# Patient Record
Sex: Female | Born: 1970 | Race: Black or African American | Hispanic: No | Marital: Single | Smoking: Never smoker
Health system: Southern US, Community
[De-identification: ages and names within clinical notes are randomized; demographics above are authoritative.]

## PROBLEM LIST (undated history)

## (undated) DIAGNOSIS — Z973 Presence of spectacles and contact lenses: Secondary | ICD-10-CM

## (undated) DIAGNOSIS — G8929 Other chronic pain: Secondary | ICD-10-CM

## (undated) DIAGNOSIS — D500 Iron deficiency anemia secondary to blood loss (chronic): Secondary | ICD-10-CM

## (undated) DIAGNOSIS — F419 Anxiety disorder, unspecified: Secondary | ICD-10-CM

## (undated) DIAGNOSIS — D219 Benign neoplasm of connective and other soft tissue, unspecified: Secondary | ICD-10-CM

## (undated) DIAGNOSIS — G2581 Restless legs syndrome: Secondary | ICD-10-CM

## (undated) DIAGNOSIS — R1013 Epigastric pain: Secondary | ICD-10-CM

## (undated) DIAGNOSIS — F430 Acute stress reaction: Secondary | ICD-10-CM

## (undated) DIAGNOSIS — F410 Panic disorder [episodic paroxysmal anxiety] without agoraphobia: Secondary | ICD-10-CM

## (undated) DIAGNOSIS — D509 Iron deficiency anemia, unspecified: Secondary | ICD-10-CM

## (undated) DIAGNOSIS — N920 Excessive and frequent menstruation with regular cycle: Secondary | ICD-10-CM

## (undated) DIAGNOSIS — J309 Allergic rhinitis, unspecified: Secondary | ICD-10-CM

## (undated) DIAGNOSIS — R519 Headache, unspecified: Secondary | ICD-10-CM

## (undated) HISTORY — DX: Iron deficiency anemia, unspecified: D50.9

## (undated) HISTORY — DX: Other chronic pain: G89.29

## (undated) HISTORY — DX: Epigastric pain: R10.13

## (undated) HISTORY — DX: Anxiety disorder, unspecified: F41.9

## (undated) HISTORY — DX: Acute stress reaction: F43.0

## (undated) HISTORY — DX: Restless legs syndrome: G25.81

## (undated) HISTORY — PX: TUBAL LIGATION: SHX77

## (undated) HISTORY — DX: Iron deficiency anemia secondary to blood loss (chronic): D50.0

## (undated) HISTORY — DX: Benign neoplasm of connective and other soft tissue, unspecified: D21.9

## (undated) HISTORY — DX: Panic disorder (episodic paroxysmal anxiety): F41.0

## (undated) HISTORY — DX: Allergic rhinitis, unspecified: J30.9

---

## 1996-03-31 HISTORY — PX: TUBAL LIGATION: SHX77

## 1997-11-24 ENCOUNTER — Emergency Department (HOSPITAL_COMMUNITY): Admission: EM | Admit: 1997-11-24 | Discharge: 1997-11-24 | Payer: Self-pay | Admitting: Emergency Medicine

## 1998-07-19 ENCOUNTER — Emergency Department (HOSPITAL_COMMUNITY): Admission: EM | Admit: 1998-07-19 | Discharge: 1998-07-19 | Payer: Self-pay

## 2000-11-11 ENCOUNTER — Emergency Department (HOSPITAL_COMMUNITY): Admission: EM | Admit: 2000-11-11 | Discharge: 2000-11-11 | Payer: Self-pay | Admitting: Emergency Medicine

## 2000-12-08 ENCOUNTER — Other Ambulatory Visit: Admission: RE | Admit: 2000-12-08 | Discharge: 2000-12-08 | Payer: Self-pay | Admitting: Obstetrics and Gynecology

## 2002-06-27 ENCOUNTER — Encounter: Payer: Self-pay | Admitting: Emergency Medicine

## 2002-06-27 ENCOUNTER — Emergency Department (HOSPITAL_COMMUNITY): Admission: EM | Admit: 2002-06-27 | Discharge: 2002-06-27 | Payer: Self-pay | Admitting: Emergency Medicine

## 2002-10-31 ENCOUNTER — Other Ambulatory Visit: Admission: RE | Admit: 2002-10-31 | Discharge: 2002-10-31 | Payer: Self-pay | Admitting: Obstetrics and Gynecology

## 2004-06-13 ENCOUNTER — Other Ambulatory Visit: Admission: RE | Admit: 2004-06-13 | Discharge: 2004-06-13 | Payer: Self-pay | Admitting: Obstetrics and Gynecology

## 2005-07-30 ENCOUNTER — Encounter: Payer: Self-pay | Admitting: Emergency Medicine

## 2005-10-14 ENCOUNTER — Emergency Department (HOSPITAL_COMMUNITY): Admission: EM | Admit: 2005-10-14 | Discharge: 2005-10-15 | Payer: Self-pay | Admitting: Emergency Medicine

## 2005-11-26 ENCOUNTER — Emergency Department (HOSPITAL_COMMUNITY): Admission: EM | Admit: 2005-11-26 | Discharge: 2005-11-27 | Payer: Self-pay | Admitting: Emergency Medicine

## 2006-12-14 ENCOUNTER — Emergency Department (HOSPITAL_COMMUNITY): Admission: EM | Admit: 2006-12-14 | Discharge: 2006-12-15 | Payer: Self-pay | Admitting: Nurse Practitioner

## 2006-12-16 ENCOUNTER — Emergency Department (HOSPITAL_COMMUNITY): Admission: EM | Admit: 2006-12-16 | Discharge: 2006-12-16 | Payer: Self-pay | Admitting: Emergency Medicine

## 2007-01-13 ENCOUNTER — Emergency Department (HOSPITAL_COMMUNITY): Admission: EM | Admit: 2007-01-13 | Discharge: 2007-01-13 | Payer: Self-pay | Admitting: Emergency Medicine

## 2009-09-11 ENCOUNTER — Emergency Department (HOSPITAL_COMMUNITY): Admission: EM | Admit: 2009-09-11 | Discharge: 2009-09-12 | Payer: Self-pay | Admitting: Emergency Medicine

## 2010-01-18 ENCOUNTER — Emergency Department (HOSPITAL_COMMUNITY): Admission: EM | Admit: 2010-01-18 | Discharge: 2010-01-18 | Payer: Self-pay | Admitting: Emergency Medicine

## 2010-03-31 HISTORY — PX: CHOLECYSTECTOMY: SHX55

## 2010-06-12 LAB — CBC
MCV: 82.7 fL (ref 78.0–100.0)
Platelets: 268 10*3/uL (ref 150–400)
RBC: 4.51 MIL/uL (ref 3.87–5.11)
WBC: 15.9 10*3/uL — ABNORMAL HIGH (ref 4.0–10.5)

## 2010-06-12 LAB — URINE MICROSCOPIC-ADD ON

## 2010-06-12 LAB — COMPREHENSIVE METABOLIC PANEL
ALT: 17 U/L (ref 0–35)
AST: 20 U/L (ref 0–37)
Albumin: 3.7 g/dL (ref 3.5–5.2)
Alkaline Phosphatase: 54 U/L (ref 39–117)
Chloride: 108 mEq/L (ref 96–112)
GFR calc Af Amer: >60 mL/min (ref >60)
Potassium: 4.1 mEq/L (ref 3.5–5.1)
Sodium: 136 mEq/L (ref 135–145)
Total Bilirubin: 0.4 mg/dL (ref 0.3–1.2)

## 2010-06-12 LAB — DIFFERENTIAL
Basophils Absolute: 0 10*3/uL (ref 0.0–0.1)
Basophils Relative: 0 % (ref 0–1)
Eosinophils Relative: 0 % (ref 0–5)
Monocytes Absolute: 0.1 10*3/uL (ref 0.1–1.0)

## 2010-06-12 LAB — URINALYSIS, ROUTINE W REFLEX MICROSCOPIC
Bilirubin Urine: NEGATIVE
Ketones, ur: 15 mg/dL — AB
Nitrite: NEGATIVE
pH: 7.5 (ref 5.0–8.0)

## 2010-06-14 ENCOUNTER — Emergency Department (HOSPITAL_COMMUNITY)
Admission: EM | Admit: 2010-06-14 | Discharge: 2010-06-14 | Disposition: A | Discharge status: Home / Self Care | Payer: Medicaid Other | Attending: Emergency Medicine | Admitting: Emergency Medicine

## 2010-06-14 DIAGNOSIS — R112 Nausea with vomiting, unspecified: Secondary | ICD-10-CM | POA: Insufficient documentation

## 2010-06-14 DIAGNOSIS — R197 Diarrhea, unspecified: Secondary | ICD-10-CM | POA: Insufficient documentation

## 2010-06-14 DIAGNOSIS — R109 Unspecified abdominal pain: Secondary | ICD-10-CM | POA: Insufficient documentation

## 2010-06-17 LAB — POCT I-STAT, CHEM 8
BUN: 8 mg/dL (ref 6–23)
Creatinine, Ser: 1 mg/dL (ref 0.4–1.2)
Glucose, Bld: 130 mg/dL — ABNORMAL HIGH (ref 70–99)
Hemoglobin: 12.6 g/dL (ref 12.0–15.0)
TCO2: 21 mmol/L (ref 0–100)

## 2010-07-29 ENCOUNTER — Emergency Department (HOSPITAL_COMMUNITY): Payer: Medicaid Other

## 2010-07-29 ENCOUNTER — Emergency Department (HOSPITAL_COMMUNITY)
Admission: EM | Admit: 2010-07-29 | Discharge: 2010-07-29 | Disposition: A | Discharge status: Home / Self Care | Payer: Medicaid Other | Attending: Emergency Medicine | Admitting: Emergency Medicine

## 2010-07-29 DIAGNOSIS — R10819 Abdominal tenderness, unspecified site: Secondary | ICD-10-CM | POA: Insufficient documentation

## 2010-07-29 DIAGNOSIS — R109 Unspecified abdominal pain: Secondary | ICD-10-CM | POA: Insufficient documentation

## 2010-07-29 DIAGNOSIS — R112 Nausea with vomiting, unspecified: Secondary | ICD-10-CM | POA: Insufficient documentation

## 2010-07-29 LAB — DIFFERENTIAL
Eosinophils Absolute: 0 10*3/uL (ref 0.0–0.7)
Lymphocytes Relative: 5 % — ABNORMAL LOW (ref 12–46)
Lymphs Abs: 0.6 10*3/uL — ABNORMAL LOW (ref 0.7–4.0)
Monocytes Relative: 2 % — ABNORMAL LOW (ref 3–12)
Neutrophils Relative %: 94 % — ABNORMAL HIGH (ref 43–77)

## 2010-07-29 LAB — COMPREHENSIVE METABOLIC PANEL
ALT: 16 U/L (ref 0–35)
AST: 25 U/L (ref 0–37)
Albumin: 3.8 g/dL (ref 3.5–5.2)
Calcium: 9.3 mg/dL (ref 8.4–10.5)
Creatinine, Ser: 0.92 mg/dL (ref 0.4–1.2)
GFR calc Af Amer: >60 mL/min (ref >60)
GFR calc non Af Amer: >60 mL/min (ref >60)
Sodium: 138 mEq/L (ref 135–145)
Total Protein: 7.9 g/dL (ref 6.0–8.3)

## 2010-07-29 LAB — URINE MICROSCOPIC-ADD ON

## 2010-07-29 LAB — CBC
HCT: 37.4 % (ref 36.0–46.0)
MCH: 26.5 pg (ref 26.0–34.0)
MCV: 81.8 fL (ref 78.0–100.0)
Platelets: 288 10*3/uL (ref 150–400)
RBC: 4.57 MIL/uL (ref 3.87–5.11)

## 2010-07-29 LAB — URINALYSIS, ROUTINE W REFLEX MICROSCOPIC
Bilirubin Urine: NEGATIVE
Glucose, UA: NEGATIVE mg/dL
Hgb urine dipstick: NEGATIVE
Specific Gravity, Urine: 1.026 (ref 1.005–1.030)
Urobilinogen, UA: 0.2 mg/dL (ref 0.0–1.0)
pH: 8 (ref 5.0–8.0)

## 2010-07-29 MED ORDER — IOHEXOL 300 MG/ML  SOLN
100.0000 mL | Freq: Once | INTRAMUSCULAR | Status: AC | PRN
  Administered 2010-07-29: 100 mL via INTRAVENOUS

## 2010-07-30 LAB — URINE CULTURE
Colony Count: 30000
Culture  Setup Time: 201204301642

## 2010-08-13 NOTE — Consult Note (Signed)
NAME:  MAHASIN, RIVIERE.:  [id number]  MEDICAL RECORD NO.:  [id number]           PATIENT TYPE:  E  LOCATION:  WLED                         FACILITY:  [facility]  PHYSICIAN:  Ardeth Sportsman, MD     DATE OF BIRTH:  06-18-1970  DATE OF CONSULTATION:  07/29/2010 DATE OF DISCHARGE:                                CONSULTATION   REFERRING PHYSICIAN:  Orlene Och, MD, ER physician.  PRIMARY CARE:  None.  REASON FOR CONSULTATION:  Abdominal pain.  BRIEF HISTORY:  The patient is a 40 year old African-American female who was well until around 12:30 a.m. this morning when she developed pain, followed by nausea and vomiting.  She also had some diarrhea.  Pain persisted and she eventually came to the ER.  She has had some subsequent bowel movements since then, which were normal.  Her bowel movements were normal prior to this morning.  She reports the pain is epigastric in nature, and she had some vague right upper quadrant discomfort on admission.  She continued to have that pain and was treated with medications, currently she is pain-free.  During workup and exam, she did indicate some mild tenderness in the right lower quadrant. She has had no further diarrhea, nausea, or vomiting.  She has no prior history of colitis, Crohn's disease, or other GI problems except for the 2 episodes listed above.  Workup on January 18, 2010 showed a normal gallbladder and negative abdominal ultrasound.  Abdominal ultrasound today again showed no gallstones, gallbladder wall thickening, or pericholecystic fluid, the common bile duct was 4.2 mm.  Urinalysis showed 3 to 6 white cells, unchanged from the last workup.  Lipase 35. CMP shows normal electrolytes, creatinine 0.92, BUN is 6.  LFTs are normal.  Total bilirubin of 0.6, alk phos of 50, SGOT 25, SGPT is 16. CBC shows a white count of 13.7, hemoglobin of 12, hematocrit of 37, has a left shift with 94% neutrophils.  PAST MEDICAL  HISTORY: 1. Panic attacks and stress, this was first found 3 years ago. 2. She is 65 inches, 220 pounds  3. No other medical history.  PAST SURGICAL HISTORY:  Includes tubal ligation.  FAMILY HISTORY:  Father is living at 100 with high blood pressure, otherwise normal.  Mother is living at 24 in good health.  She has three brothers, one had couple MIs, another brother has polyps but no cancer, and one in good health.  No sisters.  SOCIAL HISTORY:  Tobacco none.  Alcohol social.  DRUGS:  None.  She is currently unemployed.  She has 2 children, age is 13 and 23.  She is not married.  REVIEW OF SYSTEMS:  FEVER:  None.  SKIN:  No changes.  PSYCH:  Mild increase in her stress.  WEIGHT:  She says she is up 20 pounds over the last 2 months.  She attributes to stress, CVs is positive for headaches, 2 to 3 times per week.  No stroke or seizures.  PULMONARY:  No orthopnea.  No PND.  No dyspnea on exertion.  No coughing or wheezing or recent URI.  CARDIAC:  No chest  pain.  GI:  Positive for nausea and vomiting, diarrhea this morning.  Currently, she has known of the above. No constipation.  No blood in her stool.  ABDOMEN:  Has pain mostly in the epigastric area.  GU is negative.  GYN:  She is irregular but started her last period 6 days ago.  LOWER EXTREMITIES:  Some occasional swelling.  No claudication.  MUSCULOSKELETAL:  She had some tendinitis 2 months ago, otherwise negative.  CURRENT MEDICATION:  None.  ALLERGIES:  None.  PHYSICAL EXAMINATION:  VITAL SIGNS:  Temperature on admission at 10:30 this morning was 98, blood pressure was 119/77, respiratory rate was 20, sats are 100% on room air.  Last temperature is 98.2 GENERAL:  The patient is an obese African-American female, in no acute distress.  She is currently pain-free. Last temperature is 90.2, heart rate is 109, blood pressure is 123/74, sats of 99%. HEENT:  Head normocephalic.  Ears, nose, throat, and mouth are  grossly within normal limits. NECK:  Trachea is in the midline.  Thyroid is nonpalpable. CHEST:  Clear to auscultation and percussion. RESPIRATORY:  Effort is normal. CARDIAC:  Normal S1-S2.  She is slightly tachy, pulses are +2 and equal bilaterally. ABDOMEN:  Normal bowel sounds.  She is slightly tender in the epigastric area notes her primary complaint, she did have some vague tenderness right upper and lower quadrants which was rather transient.  She can cough, moved, she got and walked to the bathroom without any discomfort. Hernia, none.  Mass is none.  Abscesses none. GI/RECTAL:  Deferred. GENITALIA:  Not examined. LYMPHADENOPATHY:  None palpated. MUSCULOSKELETAL:  Within normal limits. SKIN:  No changes. NEUROLOGIC:  Normal. CRANIAL NERVES:  Gates normal.  No focal deficits. PSYCH:  Normal affect.  IMPRESSION: 1. Abdominal pain with nausea and diarrhea which is now resolved. 2. Elevated white count at three.  CT scan showed lung bases are     clear.  Liver, spleen, pancreas, adrenals are normal.  There was no     renal pathology, no calcified gallstones, and biliary dilatation.     No masses or fluid collection.  The proximal appendix was normal;     however the tip appear dilated.  There was a suggestion of     hyperemia.   ASSESMENT:     This was reviewed by Dr. Michaell Cowing.  It is his opinion that     although this could be  her appendix, it would be very atypical.      It was not enough to warrent surgical admission at this point.  If the     patient's admitted, will follow was see again as needed for     followup.     Eber Hong, P.A.   ______________________________ Ardeth Sportsman, MD    WDJ/MEDQ  D:  07/29/2010  T:  07/29/2010  Job:  [id number]  Electronically Signed by Sherrie George P.A. on 08/03/2010 08:49:31 PM Electronically Signed by Karie Soda MD on 08/13/2010 12:05:55 PM

## 2010-08-17 NOTE — Consult Note (Signed)
NAME:  Victoria Bender.:  [id number]  MEDICAL RECORD NO.:  [id number]           PATIENT TYPE:  E  LOCATION:  WLED                         FACILITY:  [facility]  PHYSICIAN:  Lonia Blood, M.D.DATE OF BIRTH:  1971/03/01  DATE OF CONSULTATION:  07/29/2010 DATE OF DISCHARGE:  07/29/2010                                CONSULTATION   PRIMARY CARE PHYSICIAN:  Unassigned.  CHIEF COMPLAINT:  Abdominal pain.  PRESENT ILLNESS:  Victoria Bender is a very pleasant 40 year old female with no significant past medical history.  She presented to the emergency room with complaints of abdominal pain.  She states she was in her usual state of health until around 12:30 this morning.  She then suffered the acute onset of right upper quadrant and epigastric crampy abdominal pain.  This was shortly thereafter associated with severe nausea and vomiting.  There was no hematemesis.  She also developed some diarrhea along with this.  She states that this might have been related to eating meat loaf prior to the onset of her symptoms.  Her nausea and vomiting continued throughout the morning.  She presented to the emergency room around 9:30 for evaluation.  With antiemetics and pain control, her nausea and vomiting have improved.  It appeared her abdominal pain is coming and going depending upon the pain medications being administered.  She has had no chest pain or shortness of breath. She reports 2 prior episodes similar to this.  The first dates back to October 2011 and the second was approximately 2 months ago.  She states these resolved spontaneously on their own.  In the emergency room, a gallbladder ultrasound has been accomplished and is without evidence of cholecystitis.  A CT scan of the abdomen and pelvis is essentially unremarkable, but there is some noncommittal question raised of a possible distal appendicitis.  The patient has however been seen by the general  surgery service and they do not feel the patient is suffering with acute appendicitis.  The emergency room staff felt the patient was cleared for discharge, stable for discharge home as that apparently to general surgery team.  The patient however was very reluctant to be discharged home.  As a result, consultation was requested with a medical service.  At the time of my evaluation, the patient is resting comfortably in hospital bed.  She has her husband and her father in the room with her. She reports the pain is still there, but states that it has improved some.  As noted above, she has had no further vomiting.  PAST MEDICAL HISTORY:  No chronic medical problems appreciable.  OUTPATIENT MEDICATIONS:  None.  ALLERGIES:  No known drug allergies.  REVIEW OF SYSTEMS:  As comprehensive review of systems is unrevealing with exception to the positive elements noted in the history of present illness above.  FAMILY HISTORY:  Noncontributory, but reviewed.  SOCIAL HISTORY:  The patient lives in the [place] area.  She is married.  She does not smoke.  She does not drink.  LABORATORY DATA:  Urinalysis reveals 3 to 6 white blood cells with a few bacteria and trace leukocyte  esterase, lipase is normal.  CMET is normal with exception to borderline elevated glucose at 113, but total bili is 0.6 with an alk phos of 50 and normal LFTs.  Albumin is 3.8.  Renal function is normal.  White count is mildly elevated at 13.7 with a normal hemoglobin, normal platelet count and a normal MCV.  PHYSICAL EXAMINATION:  VITAL SIGNS:  Temperature is 98.1 with a blood pressure of 123/74, heart rate of 109, respiratory rate of 20, and O2 sats 100% on room air. GENERAL:  Well-developed, well-nourished overweight female in no acute respiratory distress. LUNGS:  Clear to auscultation bilaterally. CARDIOVASCULAR:  Mildly tachycardic at approximately 100 beats per minute, regular rate, regular rhythm otherwise  without murmur, gallop or rub. ABDOMEN:  Mildly tender in the right upper quadrant epigastrium and left upper quadrant, but there is no rebound, no mass.  No ascites and the abdomen is not distended - in fact the patient tolerates palpation over abdomen quite well. EXTREMITIES:  No significant cyanosis, clubbing, edema bilateral lower extremities.  RECOMMENDATIONS:  I have had a very frank discussion with Victoria Bender.  I have informed her that I am aware that she continues to suffer with some pain and that she is quite reluctant to go home, but at this time I do not find any concrete indications for admission.  I have explained to her that I could easily admit her to the hospital, but that I could not necessarily attest to the absolute medical necessity of such an admission in the case that her insurance company could potentially refused to pay for her admission.  I have advised her that in such a situation she would be then held responsible financially for the bill. I have explained to her in no uncertain terms whatsoever that my primary concern is that she be safe and receive appropriate medical care.  I have explained to her that I do feel that she is safe for discharge home.  I have offered her admission to the hospital under observation status if it will help her feel more comfortable, but I again I have reiterated that I do not necessarily feel this is medically necessary and have warned her that it could result in a bill being generated that she herself would be responsible for.  After our discussion, the patient states that she feels okay to go home.  I have advised her that we cannot definitively diagnose her issue but that we do not feel that she has acute appendicitis or acute cholecystitis.  I have also advised that she does not appear to have pancreatitis and that we do think it is safe for discharge home.  I have advised her however that should her  symptoms worsen or she feel that she is not improving that she is always welcome in any time to return immediately back to the emergency room.  I have discussed this case with the emergency room physician and the physician tender caring for the patient.  I have advised that perhaps a 3-day course of antibiotic therapy for her possible urinary tract infection would be reasonable, but otherwise I do feel that she is safe for discharge home.  I have advised them that she is now in agreement with this plan.  It has been a pleasure to meet Victoria Bender and her very nice family.  I will be happy to care for her again in the future should she require return to the emergency room.    Tinnie Gens  Silvestre Gunner, M.D.    JTM/MEDQ  D:  07/29/2010  T:  07/29/2010  Job:  [id number]  Electronically Signed by Jetty Duhamel M.D. on 08/17/2010 03:11:05 PM

## 2010-08-31 ENCOUNTER — Emergency Department (HOSPITAL_COMMUNITY)
Admission: EM | Admit: 2010-08-31 | Discharge: 2010-08-31 | Disposition: A | Discharge status: Home / Self Care | Payer: Medicaid Other | Attending: Emergency Medicine | Admitting: Emergency Medicine

## 2010-08-31 DIAGNOSIS — R10819 Abdominal tenderness, unspecified site: Secondary | ICD-10-CM | POA: Insufficient documentation

## 2010-08-31 DIAGNOSIS — R112 Nausea with vomiting, unspecified: Secondary | ICD-10-CM | POA: Insufficient documentation

## 2010-08-31 DIAGNOSIS — R1013 Epigastric pain: Secondary | ICD-10-CM | POA: Insufficient documentation

## 2010-08-31 LAB — URINALYSIS, ROUTINE W REFLEX MICROSCOPIC
Nitrite: NEGATIVE
Protein, ur: NEGATIVE mg/dL
Urobilinogen, UA: 1 mg/dL (ref 0.0–1.0)

## 2010-08-31 LAB — CBC
MCH: 26.6 pg (ref 26.0–34.0)
MCV: 80.8 fL (ref 78.0–100.0)
Platelets: 314 10*3/uL (ref 150–400)
RBC: 4.43 MIL/uL (ref 3.87–5.11)
RDW: 13.5 % (ref 11.5–15.5)
WBC: 13.1 10*3/uL — ABNORMAL HIGH (ref 4.0–10.5)

## 2010-08-31 LAB — POCT PREGNANCY, URINE: Preg Test, Ur: NEGATIVE

## 2010-08-31 LAB — DIFFERENTIAL
Basophils Relative: 0 % (ref 0–1)
Eosinophils Absolute: 0 10*3/uL (ref 0.0–0.7)
Eosinophils Relative: 0 % (ref 0–5)
Lymphs Abs: 1.8 10*3/uL (ref 0.7–4.0)
Neutrophils Relative %: 82 % — ABNORMAL HIGH (ref 43–77)

## 2010-08-31 LAB — COMPREHENSIVE METABOLIC PANEL
AST: 20 U/L (ref 0–37)
Albumin: 3.7 g/dL (ref 3.5–5.2)
BUN: 6 mg/dL (ref 6–23)
Creatinine, Ser: 0.79 mg/dL (ref 0.4–1.2)
GFR calc Af Amer: >60 mL/min (ref >60)
Total Protein: 7.7 g/dL (ref 6.0–8.3)

## 2010-08-31 LAB — LIPASE, BLOOD: Lipase: 14 U/L (ref 11–59)

## 2010-08-31 LAB — URINE MICROSCOPIC-ADD ON

## 2010-09-01 LAB — URINE CULTURE
Colony Count: 15000
Culture  Setup Time: 201206021916

## 2010-10-14 ENCOUNTER — Ambulatory Visit (INDEPENDENT_AMBULATORY_CARE_PROVIDER_SITE_OTHER): Payer: Medicaid Other | Admitting: Internal Medicine

## 2010-10-14 ENCOUNTER — Encounter: Payer: Self-pay | Admitting: Internal Medicine

## 2010-10-14 VITALS — BP 106/74 | HR 126 | Ht 65.0 in | Wt 231.0 lb

## 2010-10-14 DIAGNOSIS — R112 Nausea with vomiting, unspecified: Secondary | ICD-10-CM | POA: Insufficient documentation

## 2010-10-14 DIAGNOSIS — R1013 Epigastric pain: Secondary | ICD-10-CM

## 2010-10-14 NOTE — Assessment & Plan Note (Addendum)
5 episodes of acute epigastric pain with 3 ED visits since October 2011. Sudden onset. She is well in between. Despite negative abdominal ultrasounds and an unrevealing CT scan, I am suspicious of gallstones or perhaps even a common bile duct stone. She could have gallbladder dyskinesia as well. Will evaluate with HIDA scan and ejection fraction. LFTs, pancreatic function tests have been negative. She has had mild leukocytosis. The ejection fraction is low I think a surgical referral make sense. At this point, though she can't need an upper GI endoscopy I would not do that at this time.  Further plans pending these results and clinical course. Endoscopic ultrasound or MRI and MRCP could be utilized as the next imaging.

## 2010-10-14 NOTE — Progress Notes (Signed)
  Subjective:    Patient ID: Victoria Bender, female    DOB: Jun 12, 1970, 40 y.o.   MRN: [id number]  [facility] yo African-american woman with 8-9 month history of sudden onset of epigastric pain associated with nausea and vomiting. 5 times since October 2011. Has been to ED three times and has had unrevealing work-ups including CT and [place]. One ? Of appendicitis but [facility] thought not. Last episode was a couple of weeks ago and last ED visit 6/2. She is sick for 12-24 hours.  No radiation into back and no clear warning signs or symptoms. Not especially related to eating though did occur 1 hour after ice cream at one point. If gets analgesics in ED will not last 12-24 hours. No associated headaches. Pain as bad as labor pains.     Review of Systems + eyeglasses, tendinitis in thumbs - cortisone shots helped - No NSAID's All other ROS negative    Objective:   Physical Exam  Constitutional: She appears well-developed and well-nourished. No distress.       obese  HENT:  Head: Normocephalic and atraumatic.  Mouth/Throat: No oropharyngeal exudate.  Eyes: Conjunctivae are normal. Pupils are equal, round, and reactive to light. No scleral icterus.  Neck: Normal range of motion. Neck supple. No thyromegaly present.       No cervical or Sterling adenopathy  Cardiovascular: Normal rate, regular rhythm and normal heart sounds.  Exam reveals no gallop and no friction rub.   No murmur heard. Pulmonary/Chest: Effort normal and breath sounds normal.  Abdominal: Soft. Bowel sounds are normal. She exhibits no distension and no mass. There is no tenderness.       No HSM  Musculoskeletal: She exhibits no edema.  Neurological: She is alert.  Skin: Skin is warm and dry.  Psychiatric: She has a normal mood and affect. Her behavior is normal.          Assessment & Plan:

## 2010-10-14 NOTE — Assessment & Plan Note (Signed)
Associated with the epigastric pain I am still thinking of a biiary cause Await HIDA

## 2010-10-14 NOTE — Patient Instructions (Addendum)
As we discussed today, even though the testing so far does not show it I think your pain is most likely from the gallbladder or bile duct. That may not be the case but let's see what the HIDA scan with ejection fraction shows and we will call you with those results and plans. A surgical evaluation will be in order if the ejection fraction is low, which would suggest her gallbladder does not squeeze properly. If that test is unrevealing we'll need to decide whether or not you have an endoscopic ultrasound or MRI scan. Iva Boop, MD, [facility]   You have been scheduled for a HIDA Scan at [facility] on August 1st at 10:00 am. Please arrive at 9:45 am at the Radiology Department on the first floor. Nothing to eat or drink after midnight the night before your procedure.

## 2010-10-30 ENCOUNTER — Encounter (HOSPITAL_COMMUNITY)
Admission: RE | Admit: 2010-10-30 | Discharge: 2010-10-30 | Disposition: A | Discharge status: Home / Self Care | Payer: Medicaid Other | Source: Ambulatory Visit | Attending: Internal Medicine | Admitting: Internal Medicine

## 2010-10-30 DIAGNOSIS — R112 Nausea with vomiting, unspecified: Secondary | ICD-10-CM | POA: Insufficient documentation

## 2010-10-30 DIAGNOSIS — R1013 Epigastric pain: Secondary | ICD-10-CM | POA: Insufficient documentation

## 2010-10-30 MED ORDER — SINCALIDE 5 MCG IJ SOLR: 0.0200 ug/kg | Freq: Once | INTRAMUSCULAR | Status: DC

## 2010-10-30 MED ORDER — TECHNETIUM TC 99M MEBROFENIN IV KIT
5.5000 | PACK | Freq: Once | INTRAVENOUS | Status: AC | PRN
  Administered 2010-10-30: 5.5 via INTRAVENOUS

## 2010-11-04 ENCOUNTER — Telehealth: Payer: Self-pay

## 2010-11-04 NOTE — Telephone Encounter (Signed)
Notes Recorded by Iva Boop, MD on 11/04/2010 at 6:23 AM Gallbladder squeezes very well - so cause of problems not entirely clear yet. Still working on it for her. i cannot remember but does not look like she has seen the surgeon at this point - or has she - please check and let me know ------   Message left for pt to call back.

## 2010-11-05 NOTE — Telephone Encounter (Signed)
Left message for pt to call back  °

## 2010-11-08 ENCOUNTER — Telehealth: Payer: Self-pay

## 2010-11-08 NOTE — Telephone Encounter (Signed)
Message copied by Michele Mcalpine on Fri Nov 08, 2010  3:18 PM ------      Message from: Iva Boop      Created: Fri Nov 08, 2010  1:53 PM       She should have an EGD to evaluate this epigastric pain and vomiting since gallbladder work-up is unrevealing.      Please arrange

## 2010-11-08 NOTE — Progress Notes (Signed)
Quick Note:  She should have an EGD to evaluate this epigastric pain and vomiting since gallbladder work-up is unrevealing. Please arrange ______

## 2010-11-08 NOTE — Telephone Encounter (Signed)
Pt scheduled for previsit 11/13/10@8 :30am, EGD scheduled with Dr. Leone Payor 11/25/10@2 :30pm. Pt aware of appt dates and times.

## 2010-11-08 NOTE — Telephone Encounter (Signed)
Left message for pt to call back.  Spoke with pt and she is aware. Pt states that she has not seen a Careers adviser.

## 2010-11-13 ENCOUNTER — Ambulatory Visit (AMBULATORY_SURGERY_CENTER): Payer: Medicaid Other | Admitting: *Deleted

## 2010-11-13 VITALS — Ht 65.0 in | Wt 228.0 lb

## 2010-11-13 DIAGNOSIS — R1013 Epigastric pain: Secondary | ICD-10-CM

## 2010-11-13 DIAGNOSIS — R112 Nausea with vomiting, unspecified: Secondary | ICD-10-CM

## 2010-11-25 ENCOUNTER — Ambulatory Visit (AMBULATORY_SURGERY_CENTER): Payer: Medicaid Other | Admitting: Internal Medicine

## 2010-11-25 ENCOUNTER — Encounter: Payer: Self-pay | Admitting: Internal Medicine

## 2010-11-25 VITALS — BP 111/59 | HR 72 | Temp 97.9°F | Resp 19 | Ht 65.0 in | Wt 230.0 lb

## 2010-11-25 DIAGNOSIS — R1013 Epigastric pain: Secondary | ICD-10-CM

## 2010-11-25 HISTORY — PX: UPPER GASTROINTESTINAL ENDOSCOPY: SHX188

## 2010-11-25 MED ORDER — HYOSCYAMINE SULFATE 0.125 MG SL SUBL: 0.1250 mg | SUBLINGUAL_TABLET | SUBLINGUAL | Status: DC | PRN

## 2010-11-25 MED ORDER — SODIUM CHLORIDE 0.9 % IV SOLN: 500.0000 mL | INTRAVENOUS | Status: DC

## 2010-11-25 NOTE — Patient Instructions (Addendum)
The esophagus, stomach and duodenum looked normal. We will make an appointment with a surgeon to discuss your symptoms to see if they think the gallbladder could be causing them. I have also prescribed hyoscyamine to help your abdominal pain.  Stop the Prilosec as it has not seemed to help.  discharge instructions given per blue and green sheets Stop prilosec Start hyoscyamine as needed for abdominal pain Dr Marvell Fuller office will arrange a surgical consult and call you with this date and time to evaluate the gall bladder.

## 2010-11-26 ENCOUNTER — Telehealth: Payer: Self-pay

## 2010-11-26 NOTE — Telephone Encounter (Signed)

## 2010-11-26 NOTE — Telephone Encounter (Signed)
I attempted to make referral to [facility] for eval for cholecystectomy.  [facility] will only take the referral from her primary care MD listed on her [employer].  Her card lists the [facility] as her primary care.  I have spoken to Tobi Bastos with the [facility].  They will review our records and see if they can make the referral without seeing the patient, if not they will see the patient and then make the referral.  I have faxed all records to [facility].  The patient is also notified of the above and she was asked to be expecting a call from the Health Dept.

## 2010-11-27 ENCOUNTER — Other Ambulatory Visit: Payer: Self-pay | Admitting: Internal Medicine

## 2010-11-27 DIAGNOSIS — R1013 Epigastric pain: Secondary | ICD-10-CM

## 2010-11-28 MED ORDER — HYOSCYAMINE SULFATE 0.125 MG SL SUBL: 0.1250 mg | SUBLINGUAL_TABLET | SUBLINGUAL | Status: AC | PRN

## 2010-11-28 NOTE — Telephone Encounter (Signed)
Pharmacy stated they didn't receive the Rx electronically sent on 11/25/10. Medication phoned in to [facility] [address]

## 2010-12-10 ENCOUNTER — Emergency Department (HOSPITAL_COMMUNITY): Payer: No Typology Code available for payment source

## 2010-12-10 ENCOUNTER — Encounter (HOSPITAL_COMMUNITY): Payer: Self-pay

## 2010-12-10 ENCOUNTER — Encounter (INDEPENDENT_AMBULATORY_CARE_PROVIDER_SITE_OTHER): Payer: Self-pay | Admitting: General Surgery

## 2010-12-10 ENCOUNTER — Emergency Department (HOSPITAL_COMMUNITY)
Admission: EM | Admit: 2010-12-10 | Discharge: 2010-12-10 | Disposition: A | Discharge status: Home / Self Care | Payer: No Typology Code available for payment source | Attending: Emergency Medicine | Admitting: Emergency Medicine

## 2010-12-10 DIAGNOSIS — M542 Cervicalgia: Secondary | ICD-10-CM | POA: Insufficient documentation

## 2010-12-10 DIAGNOSIS — M545 Low back pain, unspecified: Secondary | ICD-10-CM | POA: Insufficient documentation

## 2010-12-10 DIAGNOSIS — S139XXA Sprain of joints and ligaments of unspecified parts of neck, initial encounter: Secondary | ICD-10-CM | POA: Insufficient documentation

## 2010-12-10 DIAGNOSIS — S335XXA Sprain of ligaments of lumbar spine, initial encounter: Secondary | ICD-10-CM | POA: Insufficient documentation

## 2010-12-12 ENCOUNTER — Encounter (INDEPENDENT_AMBULATORY_CARE_PROVIDER_SITE_OTHER): Payer: Self-pay | Admitting: General Surgery

## 2010-12-12 ENCOUNTER — Ambulatory Visit (INDEPENDENT_AMBULATORY_CARE_PROVIDER_SITE_OTHER): Payer: Medicaid Other | Admitting: General Surgery

## 2010-12-12 VITALS — BP 116/80 | HR 80 | Temp 97.3°F | Ht 65.0 in | Wt 225.1 lb

## 2010-12-12 DIAGNOSIS — R1013 Epigastric pain: Secondary | ICD-10-CM

## 2010-12-12 NOTE — Progress Notes (Signed)
Chief Complaint  Patient presents with  . Other    new pt -eval of gallbladder    HPI Victoria Bender is a 40 y.o. female. This patient was referred by Dr. Leone Payor for evaluation of epigastric discomfort. She states that she has had intermittent epigastric pain since October of 2011. She denies any radiation of discomfort and describes this as a "contraction". The longest episode has lasted 24 hours but usually just last through the night. She has had some episodes immediately after eating ice cream she states that these are random episodes and has not noticed any particular food association. She has very been in the emergency room at least 4 times for evaluation of this send had a negative CT scan and ultrasound. She had a HIDA scan performed August 1 which demonstrated an ejection fraction of 90% and did not have symptoms during the [facility] infusion. When she has episodes she has associated nausea and vomiting and diarrhea but denies any blood in the stools or reflux symptoms. She also had a negative EGD performed in August as well. HPI  Past Medical History  Diagnosis Date  . 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 Abdominal pain   . Nausea & vomiting     Past Surgical History  Procedure Date  . Tubal ligation   . Upper gastrointestinal endoscopy     Family History  Problem Relation Age of Onset  . Hypertension Father   . Heart attack Brother   . Colon polyps Brother   . Pancreatic cancer Maternal Aunt   . Cancer Maternal Aunt     pancreatic  . Diabetes Maternal Grandmother     Social History History  Substance Use Topics  . Smoking status: Never Smoker   . Smokeless tobacco: Never Used  . Alcohol Use: No     social    No Known Allergies  No current outpatient prescriptions on file.    Review of Systems Review of Systems  Constitutional: Negative.   HENT: Negative.   Eyes: Negative.   Respiratory: Negative.   Cardiovascular: Negative.   Gastrointestinal:  Positive for nausea, vomiting, abdominal pain and diarrhea.  Genitourinary: Negative.   Musculoskeletal: Negative.   Skin: Negative.   Neurological: Negative.   Hematological: Negative.   Psychiatric/Behavioral: Negative.   All other systems reviewed and are negative.    Blood pressure 116/80, pulse 80, temperature 97.3 F (36.3 C), height 5\' 5"  (1.651 m), weight 225 lb 2 oz (102.116 kg), last menstrual period 11/20/2010.  Physical Exam Physical Exam  Constitutional: She is oriented to person, place, and time. She appears well-developed and well-nourished. No distress.  HENT:  Head: Normocephalic and atraumatic.  Eyes: Conjunctivae and EOM are normal. Pupils are equal, round, and reactive to light. Right eye exhibits no discharge. Left eye exhibits no discharge. No scleral icterus.  Neck: Normal range of motion. Neck supple. No tracheal deviation present.  Cardiovascular: Normal rate, regular rhythm and normal heart sounds.   Pulmonary/Chest: Effort normal and breath sounds normal. No stridor. No respiratory distress. She has no wheezes.  Abdominal: Soft. Bowel sounds are normal. She exhibits no distension and no mass. There is no tenderness. There is no rebound and no guarding.  Musculoskeletal: Normal range of motion. She exhibits no edema and no tenderness.  Neurological: She is alert and oriented to person, place, and time.  Skin: Skin is warm and dry. No rash noted. She is not diaphoretic. No erythema. No pallor.  Psychiatric: She  has a normal mood and affect. Her behavior is normal. Judgment and thought content normal.    Data Reviewed   Assessment    Epigastric abdominal pain. We had a long discussion about the possible causes of this including biliary dyskinesia or hyperkinesia. She does not have a clear cause for her epigastric abdominal pain and has had a fairly extensive workup to this point. CT and ultrasound have been unrevealing as well as EGD. The only on her malady  was a high scan which demonstrated an ejection fraction of 90% possibly consistent with hyperkinesia or spastic gallbladder. Her symptoms may be due to that. I discussed with her that this is an evolving area of interest and that we do not have a clear cause her discomfort however, I did offer cholecystectomy for possible treatment as well as for possible diagnostic procedure. I explained that one of her greatest risk for the procedure would be that her symptoms persist after cholecystectomy and she expressed understanding of this. I also discussed with her the risks of infection, bleeding, pain, scarring, injury to bowel and bile ducts, and diarrhea, and persistent symptoms. She expressed understanding and would like to proceed with anything else to rule this out as a source for her discomfort.    Plan    We will plan for laparoscopic cholecystectomy as soon as available.       Lodema Pilot DAVID 12/12/2010, 10:24 AM

## 2010-12-18 DIAGNOSIS — K8010 Calculus of gallbladder with chronic cholecystitis without obstruction: Secondary | ICD-10-CM

## 2010-12-20 ENCOUNTER — Telehealth (INDEPENDENT_AMBULATORY_CARE_PROVIDER_SITE_OTHER): Payer: Self-pay | Admitting: General Surgery

## 2010-12-20 NOTE — Telephone Encounter (Signed)
Patient's fiance called stating the patient is experiencing headaches after taking vicodin. Had surgery on Wednesday and has had this symptom after taking the medication ever since. Patient doesn't have a history of headaches. She has no other medication reactions that she is aware of. Can we change her prescription? Dr Biagio Quint pm off. Please advise.

## 2010-12-20 NOTE — Telephone Encounter (Signed)
Made patient aware to switch to Ibuprofen or Tylenol because the other pain medicine option would be in the same family as vicodin. Patient agrees with this plan and will switch to tylenol or ibuprofen.

## 2010-12-20 NOTE — Telephone Encounter (Signed)
Patient may switch to Ibuprofen or regular tylenol.  If this is insufficient for her pain control, she will need to come by to pick up a prescription for Percocet, but this is likely to continue to cause headaches if her headaches are indeed caused by Vicodin.

## 2011-01-08 LAB — POCT CARDIAC MARKERS
CKMB, poc: <1 — ABNORMAL LOW
Troponin i, poc: <0.05

## 2011-01-08 LAB — I-STAT 8, (EC8 V) (CONVERTED LAB)
Acid-base deficit: 2
BUN: 8
Bicarbonate: 23.6
Chloride: 107
Glucose, Bld: 95
HCT: 38
Hemoglobin: 12.9
Operator id: 270111
Potassium: 3.9
Sodium: 141
TCO2: 25
pCO2, Ven: 41.2 — ABNORMAL LOW
pH, Ven: 7.366 — ABNORMAL HIGH

## 2011-01-08 LAB — POCT I-STAT CREATININE
Creatinine, Ser: 1
Operator id: 270111

## 2011-01-08 LAB — D-DIMER, QUANTITATIVE: D-Dimer, Quant: 0.48

## 2011-01-09 ENCOUNTER — Encounter (INDEPENDENT_AMBULATORY_CARE_PROVIDER_SITE_OTHER): Payer: Self-pay | Admitting: General Surgery

## 2011-01-09 ENCOUNTER — Ambulatory Visit (INDEPENDENT_AMBULATORY_CARE_PROVIDER_SITE_OTHER): Payer: Medicaid Other | Admitting: General Surgery

## 2011-01-09 VITALS — BP 122/78 | HR 68 | Temp 98.3°F | Resp 16 | Ht 65.0 in | Wt 225.8 lb

## 2011-01-09 DIAGNOSIS — Z5189 Encounter for other specified aftercare: Secondary | ICD-10-CM

## 2011-01-09 DIAGNOSIS — Z4889 Encounter for other specified surgical aftercare: Secondary | ICD-10-CM

## 2011-01-09 LAB — POCT CARDIAC MARKERS
CKMB, poc: <1 — ABNORMAL LOW
CKMB, poc: <1 — ABNORMAL LOW
Myoglobin, poc: 59.6
Myoglobin, poc: 38.6
Operator id: 270111
Troponin i, poc: <0.05

## 2011-01-09 LAB — D-DIMER, QUANTITATIVE: D-Dimer, Quant: 0.7 — ABNORMAL HIGH

## 2011-01-09 NOTE — Progress Notes (Signed)
Subjective:     Patient ID: Victoria Bender, female   DOB: December 30, 1970, 40 y.o.   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 She states that she has been doing well and has not had any recurrent episodes of her discomfort. She has been tolerating regular diet and has had ice cream without any recurrence of her symptoms. She states her bowels are functioning well and she is off pain medications. She states that she has returned to normal activities.  Review of Systems     Objective:   Physical Exam No distress and nontoxic-appearing her incisions are healing well without sign of infection. No evidence of hernia.    Assessment:     Status post cholecystectomy-doing well Pathology was benign    Plan:     She can return to activities as tolerated and followup on a p.r.n. basis. It appears that her symptoms may have been due to her gallbladder since she has had good relief since her surgery.

## 2011-01-16 ENCOUNTER — Inpatient Hospital Stay (HOSPITAL_COMMUNITY)
Admission: EM | Admit: 2011-01-16 | Discharge: 2011-01-18 | DRG: 440 | Disposition: A | Discharge status: Home / Self Care | Payer: Medicaid Other | Attending: Internal Medicine | Admitting: Internal Medicine

## 2011-01-16 DIAGNOSIS — R748 Abnormal levels of other serum enzymes: Secondary | ICD-10-CM | POA: Diagnosis present

## 2011-01-16 DIAGNOSIS — R112 Nausea with vomiting, unspecified: Secondary | ICD-10-CM | POA: Diagnosis present

## 2011-01-16 DIAGNOSIS — K859 Acute pancreatitis without necrosis or infection, unspecified: Principal | ICD-10-CM | POA: Diagnosis present

## 2011-01-16 DIAGNOSIS — Z9089 Acquired absence of other organs: Secondary | ICD-10-CM

## 2011-01-16 DIAGNOSIS — R1013 Epigastric pain: Secondary | ICD-10-CM | POA: Diagnosis present

## 2011-01-16 LAB — COMPREHENSIVE METABOLIC PANEL
AST: 14 U/L (ref 0–37)
Albumin: 3.6 g/dL (ref 3.5–5.2)
BUN: 7 mg/dL (ref 6–23)
CO2: 23 mEq/L (ref 19–32)
Calcium: 9.7 mg/dL (ref 8.4–10.5)
Chloride: 107 mEq/L (ref 96–112)
Creatinine, Ser: 0.89 mg/dL (ref 0.50–1.10)
Glucose, Bld: 114 mg/dL — ABNORMAL HIGH (ref 70–99)
Potassium: 4 mEq/L (ref 3.5–5.1)
Sodium: 139 mEq/L (ref 135–145)

## 2011-01-16 LAB — DIFFERENTIAL
Basophils Absolute: 0 10*3/uL (ref 0.0–0.1)
Basophils Relative: 0 % (ref 0–1)
Lymphocytes Relative: 14 % (ref 12–46)
Monocytes Absolute: 0.6 10*3/uL (ref 0.1–1.0)
Monocytes Relative: 6 % (ref 3–12)
Neutro Abs: 8.9 10*3/uL — ABNORMAL HIGH (ref 1.7–7.7)
Neutrophils Relative %: 80 % — ABNORMAL HIGH (ref 43–77)

## 2011-01-16 LAB — CBC
HCT: 36.1 % (ref 36.0–46.0)
Hemoglobin: 11.5 g/dL — ABNORMAL LOW (ref 12.0–15.0)
MCHC: 31.9 g/dL (ref 30.0–36.0)
RBC: 4.48 MIL/uL (ref 3.87–5.11)

## 2011-01-16 LAB — URINALYSIS, ROUTINE W REFLEX MICROSCOPIC
Nitrite: NEGATIVE
Specific Gravity, Urine: 1.027 (ref 1.005–1.030)
pH: 6 (ref 5.0–8.0)

## 2011-01-16 LAB — URINE MICROSCOPIC-ADD ON

## 2011-01-16 LAB — POCT PREGNANCY, URINE: Preg Test, Ur: NEGATIVE

## 2011-01-17 ENCOUNTER — Inpatient Hospital Stay (HOSPITAL_COMMUNITY): Payer: Medicaid Other

## 2011-01-17 LAB — MAGNESIUM: Magnesium: 1.8 mg/dL (ref 1.5–2.5)

## 2011-01-17 LAB — CBC
Hemoglobin: 10.2 g/dL — ABNORMAL LOW (ref 12.0–15.0)
MCH: 26.3 pg (ref 26.0–34.0)
MCHC: 32.3 g/dL (ref 30.0–36.0)
MCV: 81.4 fL (ref 78.0–100.0)

## 2011-01-17 LAB — DIFFERENTIAL
Basophils Relative: 0 % (ref 0–1)
Eosinophils Absolute: 0.1 10*3/uL (ref 0.0–0.7)
Monocytes Absolute: 0.4 10*3/uL (ref 0.1–1.0)
Monocytes Relative: 5 % (ref 3–12)
Neutro Abs: 5.3 10*3/uL (ref 1.7–7.7)

## 2011-01-17 LAB — COMPREHENSIVE METABOLIC PANEL
ALT: 9 U/L (ref 0–35)
Calcium: 9 mg/dL (ref 8.4–10.5)
Creatinine, Ser: 0.78 mg/dL (ref 0.50–1.10)
GFR calc Af Amer: >90 mL/min (ref >90)
Glucose, Bld: 104 mg/dL — ABNORMAL HIGH (ref 70–99)
Sodium: 137 mEq/L (ref 135–145)
Total Protein: 6.7 g/dL (ref 6.0–8.3)

## 2011-01-17 LAB — PHOSPHORUS: Phosphorus: 3.3 mg/dL (ref 2.3–4.6)

## 2011-01-17 LAB — LIPASE, BLOOD: Lipase: 23 U/L (ref 11–59)

## 2011-01-17 MED ORDER — IOHEXOL 300 MG/ML  SOLN
100.0000 mL | Freq: Once | INTRAMUSCULAR | Status: AC | PRN
  Administered 2011-01-17: 100 mL via INTRAVENOUS

## 2011-01-18 ENCOUNTER — Inpatient Hospital Stay (HOSPITAL_COMMUNITY): Payer: Medicaid Other

## 2011-01-18 LAB — COMPREHENSIVE METABOLIC PANEL
AST: 13 U/L (ref 0–37)
Albumin: 2.9 g/dL — ABNORMAL LOW (ref 3.5–5.2)
BUN: 4 mg/dL — ABNORMAL LOW (ref 6–23)
Calcium: 8.8 mg/dL (ref 8.4–10.5)
Chloride: 108 mEq/L (ref 96–112)
Creatinine, Ser: 0.95 mg/dL (ref 0.50–1.10)
Total Protein: 6.3 g/dL (ref 6.0–8.3)

## 2011-01-18 LAB — AMYLASE: Amylase: 62 U/L (ref 0–105)

## 2011-01-21 NOTE — H&P (Signed)
NAME:  Victoria Bender, Victoria Bender.:  [id number]  MEDICAL RECORD NO.:  [id number]  LOCATION:  [facility]                         FACILITY:  [facility]  PHYSICIAN:  Kathlen Mody, MD       DATE OF BIRTH:  June 17, 1970  DATE OF ADMISSION:  01/16/2011 DATE OF DISCHARGE:                             HISTORY & PHYSICAL   CHIEF COMPLAINT:  Epigastric pain since this afternoon.  HISTORY OF PRESENT ILLNESS:  40 year old lady with no significant past medical history came in for severe epigastric pain, started this afternoon associated with persistent nausea and vomiting.  Vomiting is nonbilious, nonbloody.  She had similar symptoms of nausea, vomiting, epigastric pain  over the last few months and she was extensively worked up with CT abdomen pelvis, HIDA scan, and ultrasound, and she had a prophylactic cholecystectomy done.  Her symptoms have resolved after cholecystectomy, but since this afternoon she had persistent epigastric pain, nonradiating associated with nausea and vomiting.  She denies any fever, chills, shortness of breath, chest pain or syncopal episodes. Denies any urinary complaints, headache, blurry vision.  In ED she was found to have an elevated lipase of 149.  Hospitalist Service have been requested to admit the patient for further evaluation and management for acute pancreatitis.  REVIEW OF SYSTEMS:  See HPI, otherwise negative.  PAST MEDICAL HISTORY:  Chronic abdominal pain.  PAST SURGICAL HISTORY:  Cholecystectomy.  FAMILY HISTORY:  Pancreatic cancer in the aunt.  ALLERGIES:  No known drug allergies.  SOCIAL HISTORY:  The patient lives at home with her husband.  Denies smoking, ETOH, or any recreational drug use.  PHYSICAL EXAMINATION:  VITAL SIGNS:  She is afebrile.  Temperature of 98.9, blood pressure 120/80, respiratory rate 18, pulse of 88, saturating 100% on room air. GENERAL:  On exam, she is alert, afebrile, in mild distress. HEENT:  Pupils equal,  reacting to light.  No JVD.  No scleral icterus. Dry mucous membranes. CARDIOVASCULAR:  S1, S2 heard.  No murmurs, rubs, or gallops. ABDOMEN:  Soft.  Epigastric tenderness.  No rebound tenderness.  No signs of peritonitis.  Bowel sounds are heard. EXTREMITIES:  No pedal edema, cyanosis, or clubbing. LUNGS:  Clear to auscultation bilaterally.  No wheezing or rhonchi.  PERTINENT LABS:  CBC showed a slight elevation of WBC count of 11,000, hemoglobin of 11.5, platelets normal.  Comprehensive metabolic panel is significant for glucose of 114.  LFTs within normal limits.  Lipase of 149.  Urine pregnancy test negative.  Urinalysis shows trace leukocytes with few bacteria.  ASSESSMENT AND PLAN:  40 year old lady with a history of chronic abdominal pain status post cholecystectomy last month, came in for persistent epigastric pain, nausea, vomiting, found to have elevated lipase.  She is being admitted for acute pancreatitis.  We will admit the patient to regular bed, start on IV fluids, normal saline at 125 mL/h, IV pain control with morphine 1 to 2 mg q.4 hours p.r.n., clear liquid diet.  Repeat lipase in a.m.  We will get a CT abdomen and pelvis with contrast.  We will probably need a gastrointestinal consult from [place] for evaluation of common bile duct stones.  We will also start the patient on  IV Protonix 40 mg daily.  DVT prophylaxis,  subcutaneous Lovenox dosing as per the pharmacy.  Further recommendation as per her clinical course.  She is full code.          ______________________________ Kathlen Mody, MD     VA/MEDQ  D:  01/16/2011  T:  01/16/2011  Job:  [id number]  Electronically Signed by Kathlen Mody MD on 01/21/2011 03:03:34 PM

## 2011-01-23 LAB — CULTURE, BLOOD (ROUTINE X 2): Culture  Setup Time: 201210190857

## 2011-02-07 ENCOUNTER — Ambulatory Visit: Payer: Medicaid Other | Admitting: Internal Medicine

## 2011-03-05 ENCOUNTER — Ambulatory Visit (INDEPENDENT_AMBULATORY_CARE_PROVIDER_SITE_OTHER): Payer: Medicaid Other | Admitting: Internal Medicine

## 2011-03-05 ENCOUNTER — Encounter: Payer: Self-pay | Admitting: Internal Medicine

## 2011-03-05 VITALS — BP 100/70 | HR 88 | Ht 65.0 in | Wt 228.2 lb

## 2011-03-05 DIAGNOSIS — R112 Nausea with vomiting, unspecified: Secondary | ICD-10-CM

## 2011-03-05 DIAGNOSIS — R1013 Epigastric pain: Secondary | ICD-10-CM

## 2011-03-05 MED ORDER — PROCHLORPERAZINE 25 MG RE SUPP: 25.0000 mg | Freq: Two times a day (BID) | RECTAL | Status: AC | PRN

## 2011-03-05 MED ORDER — HYDROCODONE-ACETAMINOPHEN 5-500 MG PO TABS: 1.0000 | ORAL_TABLET | ORAL | Status: AC | PRN

## 2011-03-05 MED ORDER — PROMETHAZINE HCL 25 MG PO TABS: 25.0000 mg | ORAL_TABLET | Freq: Four times a day (QID) | ORAL | Status: AC | PRN

## 2011-03-05 NOTE — Patient Instructions (Signed)
Your prescription(s) has(have) been sent to your pharmacy for you to pick up (Hydrocodone, Phenergan, Compazine suppository). Use these medications as directed to help with your nausea and vomiting. Go on a liquid diet if these medications doesn't help and please give [place] a call back if you are not getting any better. Return to see Dr. Leone Payor as needed or sooner.

## 2011-03-05 NOTE — Progress Notes (Signed)
Subjective:    Patient ID: Victoria Bender, female    DOB: 1970-05-03, 40 y.o.   MRN: [id number]  HPI The patient is a pleasant 40 year old African American woman I have seen for recurrent epigastric pain and nausea and vomiting. She was worked up with an EGD, ultrasound and a HIDA scan. Biliary dyskinesia was thought to be the problem. She underwent cholecystectomy. About a month after that she had another episode of acute epigastric pain associated with nausea and vomiting and some diarrhea. It was typical of the spells she has been having for the past 10 months. It was of sudden onset without warning. She went to the hospital and was admitted and was given a diagnosis of pancreatitis. I see in 1 documentation note that the lipase was mildly elevated though in the lab review I cannot find that. She had a CT scan of the abdomen and pelvis, and an MRI MRCP which showed no pancreatic abnormalities or any other significant abnormalities. She feels well since then, as is typical of her episodic pain and problems. No Known Allergies No outpatient prescriptions prior to visit.   Past Medical History  Diagnosis Date  . 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 Chronic epigastric pain     recurrent discrete episodes   Past Surgical History  Procedure Date  . Tubal ligation   . Upper gastrointestinal endoscopy 11/25/2010    NORMAL  . Cholecystectomy 2012   History   Social History  . Marital Status: Divorced    Spouse Name: N/A    Number of Children: 2  . Years of Education: N/A   Social History Main Topics  . Smoking status: Never Smoker   . Smokeless tobacco: Never Used  . Alcohol Use: No     social  . Drug Use: No  . Sexually Active: None   Other Topics Concern  . None   Social History Narrative  . None   Family History  Problem Relation Age of Onset  . Hypertension Father   . Heart attack Brother   . Colon polyps Brother   . Pancreatic cancer Maternal Aunt   . Diabetes  Maternal Grandmother   . Colon cancer Neg Hx        Review of Systems There is no history of headaches, no migraines specifically. She does have increased cramping with her menstrual periods but it is not associated the spells of epigastric pain and vomiting.    Objective:   Physical Exam Obese well-developed well-nourished black woman in no acute distress Abdomen is obese soft and nontender without organomegaly or mass bowel sounds are present, there is no succussion splash  Data reviewed included labs from hospitalization in October CT scanning MR CP results and old records from previous visits here.    Assessment & Plan:  She has a chronic recurrent epigastric pain and nausea and vomiting syndrome for the last 8 months. It is episodic, and usually of short duration not more than a day. Laboratory testing, upper endoscopy, ultrasound, CT scanning and MRI with MRCP had not revealed a cause. Although it is not typical in that she does not have migraine headache history, perhaps this is an abdominal migraine.  I have explained to her that we have ruled out most serious problems. She has had one spell since her cholecystectomy. I'm not convinced that means this is a recurrent problem but it probably is, unfortunately. At this point we have decided to treat her symptomatically and track  over time. I have hydrocodone with acetaminophen, oral promethazine, and rectal Compazine to try to treat future episodes prescribed. She is to call back if she has problems again.  She could need more aggressive workup depending upon future problems, if they occur. Some consideration would be for porphyria, however that is extremely uncommon, and possibly some other type of formal release syndrome such as a pheochromocytoma but imaging has not suggested any abnormalities at this point. Again that is rare and I think her symptoms are atypical for that.

## 2011-04-12 ENCOUNTER — Emergency Department (HOSPITAL_COMMUNITY)
Admission: EM | Admit: 2011-04-12 | Discharge: 2011-04-12 | Disposition: A | Discharge status: Home / Self Care | Payer: Medicaid Other | Attending: Emergency Medicine | Admitting: Emergency Medicine

## 2011-04-12 ENCOUNTER — Encounter (HOSPITAL_COMMUNITY): Payer: Self-pay | Admitting: *Deleted

## 2011-04-12 DIAGNOSIS — R112 Nausea with vomiting, unspecified: Secondary | ICD-10-CM | POA: Insufficient documentation

## 2011-04-12 DIAGNOSIS — R1013 Epigastric pain: Secondary | ICD-10-CM | POA: Insufficient documentation

## 2011-04-12 DIAGNOSIS — R21 Rash and other nonspecific skin eruption: Secondary | ICD-10-CM | POA: Insufficient documentation

## 2011-04-12 DIAGNOSIS — Z9089 Acquired absence of other organs: Secondary | ICD-10-CM | POA: Insufficient documentation

## 2011-04-12 LAB — COMPREHENSIVE METABOLIC PANEL
ALT: 11 U/L (ref 0–35)
AST: 13 U/L (ref 0–37)
Albumin: 3.6 g/dL (ref 3.5–5.2)
Alkaline Phosphatase: 63 U/L (ref 39–117)
BUN: 8 mg/dL (ref 6–23)
CO2: 21 mEq/L (ref 19–32)
Calcium: 9.3 mg/dL (ref 8.4–10.5)
Chloride: 106 mEq/L (ref 96–112)
Creatinine, Ser: 0.89 mg/dL (ref 0.50–1.10)
GFR calc Af Amer: >90 mL/min (ref >90)
GFR calc non Af Amer: 81 mL/min — ABNORMAL LOW (ref >90)
Glucose, Bld: 122 mg/dL — ABNORMAL HIGH (ref 70–99)
Potassium: 4.5 mEq/L (ref 3.5–5.1)
Sodium: 136 mEq/L (ref 135–145)
Total Bilirubin: 0.4 mg/dL (ref 0.3–1.2)
Total Protein: 7.7 g/dL (ref 6.0–8.3)

## 2011-04-12 LAB — POCT PREGNANCY, URINE: Preg Test, Ur: NEGATIVE

## 2011-04-12 LAB — CBC
HCT: 35.1 % — ABNORMAL LOW (ref 36.0–46.0)
Hemoglobin: 11.8 g/dL — ABNORMAL LOW (ref 12.0–15.0)
MCH: 26.2 pg (ref 26.0–34.0)
MCHC: 33.6 g/dL (ref 30.0–36.0)
MCV: 77.8 fL — ABNORMAL LOW (ref 78.0–100.0)
Platelets: 273 10*3/uL (ref 150–400)
RBC: 4.51 MIL/uL (ref 3.87–5.11)
RDW: 14.9 % (ref 11.5–15.5)
WBC: 13.3 10*3/uL — ABNORMAL HIGH (ref 4.0–10.5)

## 2011-04-12 LAB — DIFFERENTIAL
Basophils Absolute: 0 10*3/uL (ref 0.0–0.1)
Basophils Relative: 0 % (ref 0–1)
Eosinophils Absolute: 0 10*3/uL (ref 0.0–0.7)
Eosinophils Relative: 0 % (ref 0–5)
Lymphocytes Relative: 7 % — ABNORMAL LOW (ref 12–46)
Lymphs Abs: 0.9 10*3/uL (ref 0.7–4.0)
Monocytes Absolute: 0.3 10*3/uL (ref 0.1–1.0)
Monocytes Relative: 3 % (ref 3–12)
Neutro Abs: 12.1 10*3/uL — ABNORMAL HIGH (ref 1.7–7.7)
Neutrophils Relative %: 91 % — ABNORMAL HIGH (ref 43–77)

## 2011-04-12 LAB — LIPASE, BLOOD: Lipase: 23 U/L (ref 11–59)

## 2011-04-12 MED ORDER — MORPHINE SULFATE 4 MG/ML IJ SOLN
4.0000 mg | Freq: Once | INTRAMUSCULAR | Status: AC
  Administered 2011-04-12: 4 mg via INTRAVENOUS
  Filled 2011-04-12: qty 1

## 2011-04-12 MED ORDER — GI COCKTAIL ~~LOC~~
ORAL | Status: AC
  Filled 2011-04-12: qty 30

## 2011-04-12 MED ORDER — ONDANSETRON 8 MG PO TBDP: 8.0000 mg | ORAL_TABLET | Freq: Three times a day (TID) | ORAL | Status: AC | PRN

## 2011-04-12 MED ORDER — GI COCKTAIL ~~LOC~~
30.0000 mL | Freq: Once | ORAL | Status: AC
  Administered 2011-04-12: 30 mL via ORAL

## 2011-04-12 MED ORDER — ONDANSETRON HCL 4 MG/2ML IJ SOLN
4.0000 mg | Freq: Once | INTRAMUSCULAR | Status: AC
  Administered 2011-04-12: 4 mg via INTRAVENOUS
  Filled 2011-04-12: qty 2

## 2011-04-12 MED ORDER — SODIUM CHLORIDE 0.9 % IV BOLUS (SEPSIS)
1000.0000 mL | Freq: Once | INTRAVENOUS | Status: AC
  Administered 2011-04-12: 1000 mL via INTRAVENOUS

## 2011-04-12 NOTE — ED Notes (Signed)
EAV:[id number]<JX> Expected date:04/12/11<BR> Expected time: 2:37 AM<BR> Means of arrival:Ambulance<BR> Comments:<BR> EMS - ABD pain

## 2011-04-12 NOTE — ED Notes (Signed)
Pt alert and oriented x4. Respirations even and unlabored, bilateral symmetrical rise and fall of chest. Skin warm and dry. In no acute distress. Denies needs. Family at bedside. Pt provided urine cup for urine specimen.

## 2011-04-12 NOTE — ED Notes (Addendum)
Per EMS:  Pt started having abdominal pain around 2000 last night, went to sleep and woke up at 12am from the pain, st's it's severe.  St's it' a sharp/stabbing pain, pt vomited.  No diarrhea, told EMS "there's no way I'm pregnant."  Pain doesn't radiate.  Pt has chronic epigastric pain, slept on the ride to the hospital.

## 2011-04-12 NOTE — ED Provider Notes (Signed)
6:15 AM Assumed care of patient from Darlys Gales, NP.  Patient comes in with chief complaint of epigastric pain and persistent vomiting.  She reports that the pain has improved after given the morphine and zofran.  She has not had any vomiting since arrival in the ED.  Abdomen soft.  Bowel sounds present in all 4 quadrants, no rebound, no guarding.  Mild epigastric tenderness to palpation.  7:19 AM Patient reports that her pain has improved with the GI cocktail.   She has not had any episodes of vomiting.  Will po trial patient and reassess.  Urine pregnancy pending.  8:03 AM Reassessed patient.  Patient reports that she is not having any pain at this time.  She was able to tolerate oral liquids.  Urine pregnancy negative.  Will discharge patient home.  Patient in agreement with the plan.  Patient instructed to follow up with Orthopedics.  Pascal Lux [facility] 04/12/11 1649

## 2011-04-12 NOTE — ED Provider Notes (Signed)
History     CSN: [id number]  Arrival date & time 04/12/11  0236   First MD Initiated Contact with Patient 04/12/11 0409      Chief Complaint  Patient presents with  . Abdominal Pain    LUQ     Patient is a 41 y.o. female presenting with abdominal pain. The history is provided by the patient.  Abdominal Pain The primary symptoms of the illness include abdominal pain, nausea and vomiting. The primary symptoms of the illness do not include fever, shortness of breath or diarrhea. The current episode started 6 to 12 hours ago. The onset of the illness was sudden. The problem has been gradually improving.  The patient states that she believes she is currently not pregnant. The patient has not had a change in bowel habit. Symptoms associated with the illness do not include chills, diaphoresis or back pain.  Patient reports sudden onset of upper epigastric area pain at approximately 8:00 last evening. The pain has been associated with multiple episodes of nausea and vomiting. Denies fever, diarrhea, chest pain or shortness of breath. States that she had her gallbladder removed last September and has had similar episodes of abdominal pain stance. Patient states that she was admitted in October one month after her cholecystectomy with similar abdominal pain until she had pancreatitis, however during a followup visit with her GI physician she was told she did not have pancreatitis. CT abdomen pelvis with contrast during that admission was negative for any findings consistent with pancreatitis.Pt has had also had MR/MRCP and EGD performed at the request of her GI MD that have also been w/o acute findings. Pt does admit that symptoms, including upper abd pain have gradually improved.  Past Medical History  Diagnosis Date  . 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 Chronic epigastric pain     recurrent discrete episodes    Past Surgical History  Procedure Date  . Tubal ligation   . Upper  gastrointestinal endoscopy 11/25/2010    NORMAL  . Cholecystectomy 2012    Family History  Problem Relation Age of Onset  . Hypertension Father   . Heart attack Brother   . Colon polyps Brother   . Pancreatic cancer Maternal Aunt   . Diabetes Maternal Grandmother   . Colon cancer Neg Hx     History  Substance Use Topics  . Smoking status: Never Smoker   . Smokeless tobacco: Never Used  . Alcohol Use: No     social    OB History    Grav Para Term Preterm Abortions TAB SAB Ect Mult Living                  Review of Systems  Constitutional: Negative.  Negative for fever, chills and diaphoresis.  HENT: Negative.   Eyes: Negative.   Respiratory: Negative.  Negative for shortness of breath.   Cardiovascular: Negative.   Gastrointestinal: Positive for nausea, vomiting and abdominal pain. Negative for diarrhea.  Genitourinary: Negative.   Musculoskeletal: Negative.  Negative for back pain.  Skin: Negative.   Neurological: Negative.   Hematological: Negative.   Psychiatric/Behavioral: Negative.     Allergies  Review of patient's allergies indicates no known allergies.  Home Medications  No current outpatient prescriptions on file.  BP 111/69  Pulse 90  Temp(Src) 98.2 F (36.8 C) (Oral)  Resp 18  SpO2 100%  Physical Exam  Constitutional: She is oriented to person, place, and time. She appears well-developed and well-nourished.  HENT:  Head: Normocephalic and atraumatic.  Eyes: Conjunctivae are normal.  Neck: Neck supple.  Cardiovascular: Normal rate and regular rhythm.   Pulmonary/Chest: Effort normal and breath sounds normal.  Abdominal: Soft. Bowel sounds are normal.    Musculoskeletal: Normal range of motion.  Neurological: She is alert and oriented to person, place, and time.  Skin: Skin is warm and dry. Rash noted. Rash is papular. No erythema.  Psychiatric: She has a normal mood and affect.    ED Course  Procedures Will obtain  appropriate labs  initaite IVF's and medication for pain and nausea and re-evaluate.  0600: I have discussed pt w/ Dr Nicanor Alcon and H. Anne Shutter, PA. Tera Partridge Maralyn Sago, Pa has agreed to accept care of care of pt pending final lab results, re-evaluation and disposition.   Labs Reviewed  CBC  DIFFERENTIAL  COMPREHENSIVE METABOLIC PANEL  LIPASE, BLOOD   No results found.   No diagnosis found.    MDM  Re-current upper abd pain, N/V s/p cholecystectomy in 11/2010. Final lab results and disposition pending.        Leanne Chang, NP 04/12/11 [phone]

## 2011-04-12 NOTE — ED Notes (Signed)
PO fluid trial pt per verbal order from ED PA Pascal Lux Wingen.

## 2011-04-12 NOTE — ED Notes (Addendum)
Pt st's she started having abdominal pain around 2000 last night, st's she went to bed and was awoken by the pain.  St's she has chronic abdominal pain, st's "I've had every test done that they can run and they can't find anything, they normally just give me pain meds."  Bowel sounds normoactive in all 4 quadrants.  St's her last BM was today.  Also st's she was throwing up yellow and brown liquid pta.  St's her pain is 7/10, describes pain as dull and doesn't radiate.  No SOB, no diarrhea.

## 2011-04-13 NOTE — ED Provider Notes (Signed)
Medical screening examination/treatment/procedure(s) were performed by non-physician practitioner and as supervising physician I was immediately available for consultation/collaboration.  Dreana Britz K Dickey Caamano-Rasch, MD 04/13/11 0351 

## 2011-04-13 NOTE — ED Provider Notes (Signed)
Medical screening examination/treatment/procedure(s) were performed by non-physician practitioner and as supervising physician I was immediately available for consultation/collaboration.  Heydy Montilla K Daja Shuping-Rasch, MD 04/13/11 0351 

## 2011-05-28 ENCOUNTER — Emergency Department (HOSPITAL_COMMUNITY)
Admission: EM | Admit: 2011-05-28 | Discharge: 2011-05-28 | Disposition: A | Discharge status: Home Health | Payer: Medicaid Other | Attending: Emergency Medicine | Admitting: Emergency Medicine

## 2011-05-28 ENCOUNTER — Encounter (HOSPITAL_COMMUNITY): Payer: Self-pay | Admitting: Adult Health

## 2011-05-28 ENCOUNTER — Other Ambulatory Visit: Payer: Self-pay

## 2011-05-28 DIAGNOSIS — R002 Palpitations: Secondary | ICD-10-CM | POA: Insufficient documentation

## 2011-05-28 DIAGNOSIS — R209 Unspecified disturbances of skin sensation: Secondary | ICD-10-CM | POA: Insufficient documentation

## 2011-05-28 DIAGNOSIS — M940 Chondrocostal junction syndrome [Tietze]: Secondary | ICD-10-CM | POA: Insufficient documentation

## 2011-05-28 DIAGNOSIS — M79609 Pain in unspecified limb: Secondary | ICD-10-CM | POA: Insufficient documentation

## 2011-05-28 DIAGNOSIS — R0789 Other chest pain: Secondary | ICD-10-CM | POA: Insufficient documentation

## 2011-05-28 LAB — POCT I-STAT, CHEM 8
Calcium, Ion: 1.22 mmol/L (ref 1.12–1.32)
Chloride: 108 mEq/L (ref 96–112)
HCT: 37 % (ref 36.0–46.0)
Potassium: 3.5 mEq/L (ref 3.5–5.1)

## 2011-05-28 LAB — POCT I-STAT TROPONIN I: Troponin i, poc: 0 ng/mL (ref 0.00–0.08)

## 2011-05-28 NOTE — ED Provider Notes (Signed)
History     CSN: [id number]  Arrival date & time 05/28/11  1354   First MD Initiated Contact with Patient 05/28/11 1513      Chief Complaint  Patient presents with  . Chest Pain  . Arm Pain    HPI History provided by the patient.  She says she has been having intermittent chest pain for about a week.  She does not remember what she was doing when the pain started.  It is not worse with exertion, but is worst at night when she is lying down.  She says it is a dull nagging pain under her left breast.  She says at night sometimes she her heart races and last night this happened and it was the worse the pain had been. She has not had diaphoresis, dyspnea, or nausea.  She says the pain goes into her left arm and she has a tingling pain in her arm.    She has had no injury or new activities.  She does say her brother had a heart attack in his early 34's.  She denies any other risk factors- she is a non-smoker, non-drinker, does not have HTN, HLD, DM.    Past Medical History  Diagnosis Date  . 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 Chronic epigastric pain     recurrent discrete episodes    Past Surgical History  Procedure Date  . Tubal ligation   . Upper gastrointestinal endoscopy 11/25/2010    NORMAL  . Cholecystectomy 2012    Family History  Problem Relation Age of Onset  . Hypertension Father   . Heart attack Brother   . Colon polyps Brother   . Pancreatic cancer Maternal Aunt   . Diabetes Maternal Grandmother   . Colon cancer Neg Hx     History  Substance Use Topics  . Smoking status: Never Smoker   . Smokeless tobacco: Never Used  . Alcohol Use: No     social    Review of Systems  Constitutional: Negative for fever and diaphoresis.  HENT: Negative for rhinorrhea.   Eyes: Negative for visual disturbance.  Respiratory: Negative for shortness of breath.   Cardiovascular: Positive for chest pain and palpitations.  Gastrointestinal: Negative for nausea.   Genitourinary: Negative for dysuria.  Musculoskeletal: Negative for joint swelling.  Skin: Negative for rash.  Neurological: Positive for numbness. Negative for dizziness.  Hematological: Negative for adenopathy.    Allergies  Review of patient's allergies indicates no known allergies.  Home Medications  No current outpatient prescriptions on file.  BP 131/85  Pulse 85  Temp 98.3 F (36.8 C)  Resp 16  SpO2 100%  Physical Exam  Constitutional: She is oriented to person, place, and time. She appears well-developed and well-nourished. No distress.  HENT:  Head: Normocephalic and atraumatic.  Eyes: EOM are normal. Pupils are equal, round, and reactive to light.  Neck: Normal range of motion. No JVD present.  Cardiovascular: Normal rate, regular rhythm, normal heart sounds and intact distal pulses.  Exam reveals no gallop and no friction rub.   No murmur heard. Pulmonary/Chest: Effort normal and breath sounds normal. No respiratory distress. She exhibits tenderness.  Abdominal: Soft. Bowel sounds are normal.  Musculoskeletal: She exhibits no edema.  Lymphadenopathy:    She has no cervical adenopathy.  Neurological: She is alert and oriented to person, place, and time.  Skin: She is not diaphoretic.  OZH:YQMV arm: Tinel's and Phalen's signs negative. Numbness not reproducible.  Shoulder non-tender, with no deformity, normal ROM.   ED Course  Procedures (including critical care time)  Date: 05/28/2011  Rate: 92  Rhythm: normal sinus rhythm  QRS Axis: normal  Intervals: normal  ST/T Wave abnormalities: normal  Conduction Disutrbances: none  Narrative Interpretation:   Old EKG Reviewed: None Available.     Labs Reviewed  POCT I-STAT, CHEM 8  POCT I-STAT TROPONIN I   No results found.   1. Costochondritis       MDM  Pt with no current pain, feel this is non-cardiac, likely Costochondritis.  Discussed diagnosis with pt and her husband, they are reassured.          Ardyth Gal, MD 05/28/11 1710

## 2011-05-28 NOTE — Discharge Instructions (Signed)
Costochondritis Costochondritis (Tietze syndrome), or costochondral separation, is a swelling and irritation (inflammation) of the tissue (cartilage) that connects your ribs with your breastbone (sternum). It may occur on its own (spontaneously), through damage caused by an accident (trauma), or simply from coughing or minor exercise. It may take up to 6 weeks to get better and longer if you are unable to be conservative in your activities. HOME CARE INSTRUCTIONS   Avoid exhausting physical activity. Try not to strain your ribs during normal activity. This would include any activities using chest, belly (abdominal), and side muscles, especially if heavy weights are used.   Use ice for 15 to 20 minutes per hour while awake for the first 2 days. Place the ice in a plastic bag, and place a towel between the bag of ice and your skin.   Only take over-the-counter or prescription medicines for pain, discomfort, or fever as directed by your caregiver.  SEEK IMMEDIATE MEDICAL CARE IF:   Your pain increases or you are very uncomfortable.   You have a fever.   You develop difficulty with your breathing.   You cough up blood.   You develop worse chest pains, shortness of breath, sweating, or vomiting.   You develop new, unexplained problems (symptoms).  MAKE SURE YOU:   Understand these instructions.   Will watch your condition.   Will get help right away if you are not doing well or get worse.  Document Released: 12/25/2004 Document Revised: 11/27/2010 Document Reviewed: 11/03/2007 [employer] Patient Information 2012 [employer]. 

## 2011-05-28 NOTE — ED Notes (Signed)
C/o one week of dull nagging chest pain that radiates in left arm. Denies SOB, nausea and diaphoresis.

## 2011-05-29 NOTE — ED Provider Notes (Signed)
I saw and evaluated the patient, reviewed the resident's note and I agree with the findings and plan.   .Face to face Exam:  General:  Awake HEENT:  Atraumatic Resp:  Normal effort Abd:  Nondistended Neuro:No focal weakness Lymph: No adenopathy   Nelia Shi, MD 05/29/11 1627

## 2012-04-06 ENCOUNTER — Inpatient Hospital Stay (HOSPITAL_COMMUNITY)
Admission: AD | Admit: 2012-04-06 | Discharge: 2012-04-06 | Disposition: A | Discharge status: Home / Self Care | Payer: Self-pay | Source: Ambulatory Visit | Attending: Obstetrics & Gynecology | Admitting: Obstetrics & Gynecology

## 2012-04-06 ENCOUNTER — Encounter (HOSPITAL_COMMUNITY): Payer: Self-pay

## 2012-04-06 DIAGNOSIS — N912 Amenorrhea, unspecified: Secondary | ICD-10-CM | POA: Insufficient documentation

## 2012-04-06 DIAGNOSIS — Z3202 Encounter for pregnancy test, result negative: Secondary | ICD-10-CM | POA: Insufficient documentation

## 2012-04-06 LAB — POCT PREGNANCY, URINE: Preg Test, Ur: NEGATIVE

## 2012-04-06 NOTE — MAU Note (Signed)
Patient is in to confirm if she is pregnant or not. Denies any discomfort or pain.

## 2012-04-06 NOTE — MAU Note (Addendum)
No period sinceNov.  Did a preg test about 2 wks ago and it was neg, has had tubes tied, so didn't know what she should do.

## 2012-04-06 NOTE — MAU Provider Note (Signed)
Victoria Bender is a 42 y.o. female who presents to MAU for pregnancy test. LMP was in November and she wanted to make sure she was not pregnant. She denies pain or any other symptoms. Can not remember last pap smear, > 2 years.   BP 125/83  Pulse 85  Temp 97.9 F (36.6 C) (Oral)  Resp 20  Ht 5\' 5"  (1.651 m)  Wt 105.235 kg (232 lb)  BMI 38.61 kg/m2  LMP 02/04/2012   Results for orders placed during the hospital encounter of 04/06/12 (from the past 24 hour(s))  POCT PREGNANCY, URINE     Status: Normal   Collection Time   04/06/12  6:40 PM      Component Value Range   Preg Test, Ur NEGATIVE  NEGATIVE    Assessment: 42 y.o. female with amenorrhea  Plan:  Discussed with patient reasons for amenorrhea and need for follow up in GYN Clinic for evaluation and pap smear. Patient voices understanding and will call for appointment.

## 2012-04-21 ENCOUNTER — Inpatient Hospital Stay (HOSPITAL_COMMUNITY)
Admission: AD | Admit: 2012-04-21 | Discharge: 2012-04-21 | Disposition: A | Discharge status: Home / Self Care | Payer: Self-pay | Source: Ambulatory Visit | Attending: Obstetrics & Gynecology | Admitting: Obstetrics & Gynecology

## 2012-04-21 ENCOUNTER — Encounter (HOSPITAL_COMMUNITY): Payer: Self-pay

## 2012-04-21 DIAGNOSIS — N938 Other specified abnormal uterine and vaginal bleeding: Secondary | ICD-10-CM | POA: Insufficient documentation

## 2012-04-21 DIAGNOSIS — N920 Excessive and frequent menstruation with regular cycle: Secondary | ICD-10-CM

## 2012-04-21 DIAGNOSIS — N949 Unspecified condition associated with female genital organs and menstrual cycle: Secondary | ICD-10-CM | POA: Insufficient documentation

## 2012-04-21 LAB — CBC
HCT: 35.6 % — ABNORMAL LOW (ref 36.0–46.0)
Hemoglobin: 11.6 g/dL — ABNORMAL LOW (ref 12.0–15.0)
MCV: 78.8 fL (ref 78.0–100.0)
RBC: 4.52 MIL/uL (ref 3.87–5.11)
WBC: 7.1 10*3/uL (ref 4.0–10.5)

## 2012-04-21 LAB — URINALYSIS, ROUTINE W REFLEX MICROSCOPIC
Glucose, UA: NEGATIVE mg/dL
Ketones, ur: 15 mg/dL — AB
Leukocytes, UA: NEGATIVE
Protein, ur: NEGATIVE mg/dL

## 2012-04-21 LAB — URINE MICROSCOPIC-ADD ON

## 2012-04-21 MED ORDER — IBUPROFEN 600 MG PO TABS: 600.0000 mg | ORAL_TABLET | Freq: Once | ORAL | Status: DC

## 2012-04-21 NOTE — MAU Note (Signed)
Missed period in December, soaking pads this morning, had negative pregnancy test a couple of weeks ago, cramping.

## 2012-04-21 NOTE — MAU Provider Note (Signed)
Attestation of Attending Supervision of Advanced Practitioner (CNM/NP): Evaluation and management procedures were performed by the Advanced Practitioner under my supervision and collaboration.  I have reviewed the Advanced Practitioner's note and chart, and I agree with the management and plan.  HARRAWAY-SMITH, Keierra Nudo 2:53 PM     

## 2012-04-21 NOTE — MAU Provider Note (Signed)
History     CSN: [id number]  Arrival date and time: 04/21/12 1217   First Provider Initiated Contact with Patient 04/21/12 1252      Chief Complaint  Patient presents with  . Vaginal Bleeding   HPI This is a 42 y.o. female who presents with c/o heavy vaginal bleeding since yesterday. Missed her period in December ( normally has regular monthly cycles of normal length and flow) and started yesterday. UPT negative. Has appointment for annual exam in clinic on Friday, but was worried because bleeding was heavy. NO cramping. Took ibuprofen this am. Admits to hx one small fibroid.   RN Note: Patient is in with c/o heavy vaginal bleeding that started on Tuesday. She states that she have not had a menstrual period in two months and she started bleeding on tuesday. She thought was normal but she states that she have changed 4 tampons this morning. She states that it was not normal for her. Patient states that she have an appt at the [facility] clinic this Friday. She also states that she is not having pain at this time, she refuses the motrin at this time. The pad that was given to her at her admission here have a quarter size red spot.   OB History    Grav Para Term Preterm Abortions TAB SAB Ect Mult Living   3 2 2  1  1   2       Past Medical History  Diagnosis Date  . 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 Chronic epigastric pain     recurrent discrete episodes    Past Surgical History  Procedure Date  . Tubal ligation   . Upper gastrointestinal endoscopy 11/25/2010    NORMAL  . Cholecystectomy 2012    Family History  Problem Relation Age of Onset  . Hypertension Father   . Heart attack Brother   . Colon polyps Brother   . Pancreatic cancer Maternal Aunt   . Diabetes Maternal Grandmother   . Colon cancer Neg Hx     History  Substance Use Topics  . Smoking status: Never Smoker   . Smokeless tobacco: Never Used  . Alcohol Use: No     Comment: social    Allergies: No Known  Allergies  Prescriptions prior to admission  Medication Sig Dispense Refill  . ibuprofen (ADVIL,MOTRIN) 200 MG tablet Take 400 mg by mouth every 6 (six) hours as needed. Takes for pain        Review of Systems  Constitutional: Negative for fever, chills and malaise/fatigue.  Gastrointestinal: Negative for nausea, vomiting, abdominal pain, diarrhea and constipation.  Genitourinary: Negative for dysuria.       Heavy vaginal bleeding, lighter now   Neurological: Negative for weakness.   Physical Exam   Blood pressure 148/104, pulse 107, temperature 97.9 F (36.6 C), temperature source Oral, resp. rate 18, height 5\' 5"  (1.651 m), weight 103.964 kg (229 lb 3.2 oz), last menstrual period 04/20/2012.  Physical Exam  Constitutional: She is oriented to person, place, and time. She appears well-developed and well-nourished. No distress.  Cardiovascular: Normal rate.   Respiratory: Effort normal.  GI: Soft. She exhibits no distension and no mass. There is no tenderness. There is no rebound and no guarding.  Genitourinary: Uterus normal. Vaginal discharge (small blood on pad, no heavy flow noted) found.       Uterus nontender, no CMT Unable to fully assess due to habitus  Musculoskeletal: Normal range of motion.  Neurological: She is alert and oriented to person, place, and time.  Skin: Skin is warm and dry. She is not diaphoretic.  Psychiatric: She has a normal mood and affect.    MAU Course  Procedures  MDM CBC done to assess for anemia... Last Hgb 12.6 last year.  Results for orders placed during the hospital encounter of 04/21/12 (from the past 24 hour(s))  URINALYSIS, ROUTINE W REFLEX MICROSCOPIC     Status: Abnormal   Collection Time   04/21/12 12:24 PM      Component Value Range   Color, Urine YELLOW  YELLOW   APPearance HAZY (*) CLEAR   Specific Gravity, Urine >1.030 (*) 1.005 - 1.030   pH 5.5  5.0 - 8.0   Glucose, UA NEGATIVE  NEGATIVE mg/dL   Hgb urine dipstick LARGE  (*) NEGATIVE   Bilirubin Urine NEGATIVE  NEGATIVE   Ketones, ur 15 (*) NEGATIVE mg/dL   Protein, ur NEGATIVE  NEGATIVE mg/dL   Urobilinogen, UA 0.2  0.0 - 1.0 mg/dL   Nitrite NEGATIVE  NEGATIVE   Leukocytes, UA NEGATIVE  NEGATIVE  URINE MICROSCOPIC-ADD ON     Status: Abnormal   Collection Time   04/21/12 12:24 PM      Component Value Range   Squamous Epithelial / LPF FEW (*) RARE   RBC / HPF 21-50  <3 RBC/hpf   Urine-Other MUCOUS PRESENT    POCT PREGNANCY, URINE     Status: Normal   Collection Time   04/21/12 12:31 PM      Component Value Range   Preg Test, Ur NEGATIVE  NEGATIVE  CBC     Status: Abnormal   Collection Time   04/21/12 12:50 PM      Component Value Range   WBC 7.1  4.0 - 10.5 K/uL   RBC 4.52  3.87 - 5.11 MIL/uL   Hemoglobin 11.6 (*) 12.0 - 15.0 g/dL   HCT 30.8 (*) 65.7 - 84.6 %   MCV 78.8  78.0 - 100.0 fL   MCH 25.7 (*) 26.0 - 34.0 pg   MCHC 32.6  30.0 - 36.0 g/dL   RDW 96.2  95.2 - 84.1 %   Platelets 253  150 - 400 K/uL     Assessment and Plan  A:  Menorrhagia x 2 days, no prior history       I believe this is WNL due to missing cycle in December (2 proliferative phases)      Normal hemoglobin  P:  Discussed with patient      Advised to watch flow and if it continues beyond 7-10 days, have it reassessed      Keep appointment for this Friday in clinic      Ibuprofen for 24-48 hrs which may slow flow  Starleen Trussell 04/21/2012, 1:33 PM

## 2012-04-21 NOTE — MAU Note (Signed)
Patient is in with c/o heavy vaginal bleeding that started on Tuesday. She states that she have not had a menstrual period in two months and she started bleeding on tuesday. She thought was normal but she states that she have changed 4 tampons this morning. She states that it was not normal for her. Patient states that she have an appt at the [facility] clinic this Friday. She also states that she is not having pain at this time, she refuses the motrin at this time. The pad that was given to her at her admission here have a quarter size red spot.

## 2012-04-23 ENCOUNTER — Encounter: Payer: Self-pay | Admitting: Medical

## 2012-04-23 ENCOUNTER — Ambulatory Visit (INDEPENDENT_AMBULATORY_CARE_PROVIDER_SITE_OTHER): Payer: Self-pay | Admitting: Medical

## 2012-04-23 VITALS — BP 114/82 | HR 86 | Temp 97.0°F | Ht 65.0 in | Wt 228.1 lb

## 2012-04-23 DIAGNOSIS — N920 Excessive and frequent menstruation with regular cycle: Secondary | ICD-10-CM

## 2012-04-23 NOTE — Progress Notes (Signed)
Patient ID: Victoria Bender, female   DOB: 1971/03/29, 42 y.o.   MRN: [id number]  Patient was originally scheduled due to a missed period in November. She has since started bleeding and was evaluated for a heavy period in MAU 2 days ago. The patient states bleeding has improved, not having pain. No new concerns or questions today.   No charge for this visit.   Patient will be rescheduled to follow-up in 5-6 weeks after her next period. If everything returns to normal she may cancel.   Patient agrees with this plan and voices no additional questions or concerns today  Freddi Starr, PA-C 04/23/2012 11:16 AM

## 2012-04-23 NOTE — Patient Instructions (Signed)
Dysfunctional Uterine Bleeding  Normally, menstrual periods begin between ages 11 to 17 in young women. A normal menstrual cycle/period may begin every 23 days up to 35 days and lasts from 1 to 7 days. Around 12 to 14 days before your menstrual period starts, ovulation (ovary produces an egg) occurs. When counting the time between menstrual periods, count from the first day of bleeding of the previous period to the first day of bleeding of the next period.  Dysfunctional (abnormal) uterine bleeding is bleeding that is different from a normal menstrual period. Your periods may come earlier or later than usual. They may be lighter, have blood clots or be heavier. You may have bleeding between periods, or you may skip one period or more. You may have bleeding after sexual intercourse, bleeding after menopause, or no menstrual period.  CAUSES   · Pregnancy (normal, miscarriage, tubal).  · IUDs (intrauterine device, birth control).  · Birth control pills.  · Hormone treatment.  · Menopause.  · Infection of the cervix.  · Blood clotting problems.  · Infection of the inside lining of the uterus.  · Endometriosis, inside lining of the uterus growing in the pelvis and other female organs.  · Adhesions (scar tissue) inside the uterus.  · Obesity or severe weight loss.  · Uterine polyps inside the uterus.  · Cancer of the vagina, cervix, or uterus.  · Ovarian cysts or polycystic ovary syndrome.  · Medical problems (diabetes, thyroid disease).  · Uterine fibroids (noncancerous tumor).  · Problems with your female hormones.  · Endometrial hyperplasia, very thick lining and enlarged cells inside of the uterus.  · Medicines that interfere with ovulation.  · Radiation to the pelvis or abdomen.  · Chemotherapy.  DIAGNOSIS   · Your doctor will discuss the history of your menstrual periods, medicines you are taking, changes in your weight, stress in your life, and any medical problems you may have.  · Your doctor will do a physical  and pelvic examination.  · Your doctor may want to perform certain tests to make a diagnosis, such as:  · Pap test.  · Blood tests.  · Cultures for infection.  · CT scan.  · Ultrasound.  · Hysteroscopy.  · Laparoscopy.  · MRI.  · Hysterosalpingography.  · D and C.  · Endometrial biopsy.  TREATMENT   Treatment will depend on the cause of the dysfunctional uterine bleeding (DUB). Treatment may include:  · Observing your menstrual periods for a couple of months.  · Prescribing medicines for medical problems, including:  · Antibiotics.  · Hormones.  · Birth control pills.  · Removing an IUD (intrauterine device, birth control).  · Surgery:  · D and C (scrape and remove tissue from inside the uterus).  · Laparoscopy (examine inside the abdomen with a lighted tube).  · Uterine ablation (destroy lining of the uterus with electrical current, laser, heat, or freezing).  · Hysteroscopy (examine cervix and uterus with a lighted tube).  · Hysterectomy (remove the uterus).  HOME CARE INSTRUCTIONS   · If medicines were prescribed, take exactly as directed. Do not change or switch medicines without consulting your caregiver.  · Long term heavy bleeding may result in iron deficiency. Your caregiver may have prescribed iron pills. They help replace the iron that your body lost from heavy bleeding. Take exactly as directed.  · Do not take aspirin or medicines that contain aspirin one week before or during your menstrual period. Aspirin may make   the bleeding worse.  · If you need to change your sanitary pad or tampon more than once every 2 hours, stay in bed with your feet elevated and a cold pack on your lower abdomen. Rest as much as possible, until the bleeding stops or slows down.  · Eat well-balanced meals. Eat foods high in iron. Examples are:  · Leafy green vegetables.  · Whole-grain breads and cereals.  · Eggs.  · Meat.  · Liver.  · Do not try to lose weight until the abnormal bleeding has stopped and your blood iron level is  back to normal. Do not lift more than ten pounds or do strenuous activities when you are bleeding.  · For a couple of months, make note on your calendar, marking the start and ending of your period, and the type of bleeding (light, medium, heavy, spotting, clots or missed periods). This is for your caregiver to better evaluate your problem.  SEEK MEDICAL CARE IF:   · You develop nausea (feeling sick to your stomach) and vomiting, dizziness, or diarrhea while you are taking your medicine.  · You are getting lightheaded or weak.  · You have any problems that may be related to the medicine you are taking.  · You develop pain with your DUB.  · You want to remove your IUD.  · You want to stop or change your birth control pills or hormones.  · You have any type of abnormal bleeding mentioned above.  · You are over 16 years old and have not had a menstrual period yet.  · You are 42 years old and you are still having menstrual periods.  · You have any of the symptoms mentioned above.  · You develop a rash.  SEEK IMMEDIATE MEDICAL CARE IF:   · An oral temperature above 102° F (38.9° C) develops.  · You develop chills.  · You are changing your sanitary pad or tampon more than once an hour.  · You develop abdominal pain.  · You pass out or faint.  Document Released: 03/14/2000 Document Revised: 06/09/2011 Document Reviewed: 02/13/2009  [employer]® Patient Information ©2013 [employer].

## 2012-04-28 IMAGING — US US ABDOMEN COMPLETE
1 series · 14 of 25 positions shown · non-contrast
Comparison: None.

CLINICAL DATA: Mid abdominal pain

COMPLETE ABDOMINAL ULTRASOUND

[Series 1: us abdomen complete · 0.32mm/px · 14 of 48 slices shown]
[im 1/48]
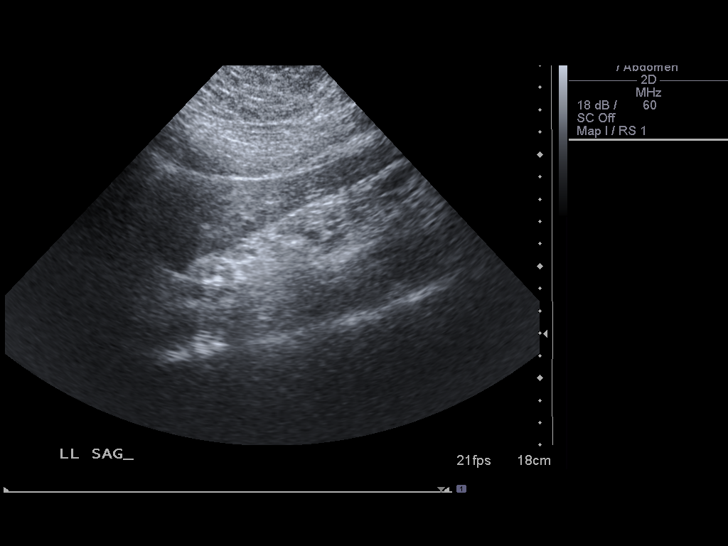
[im 4/48]
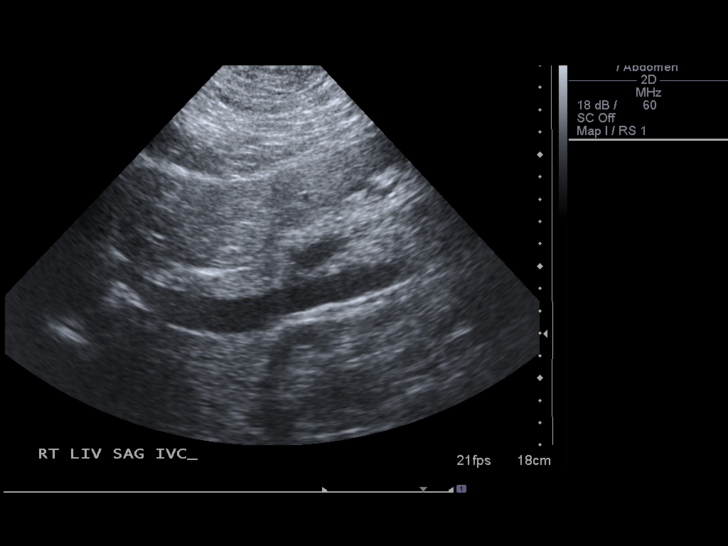
[im 8/48]
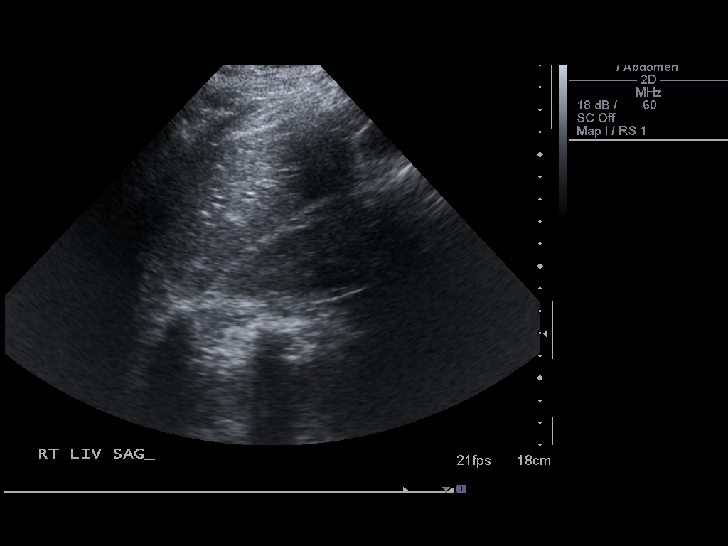
[im 12/48]
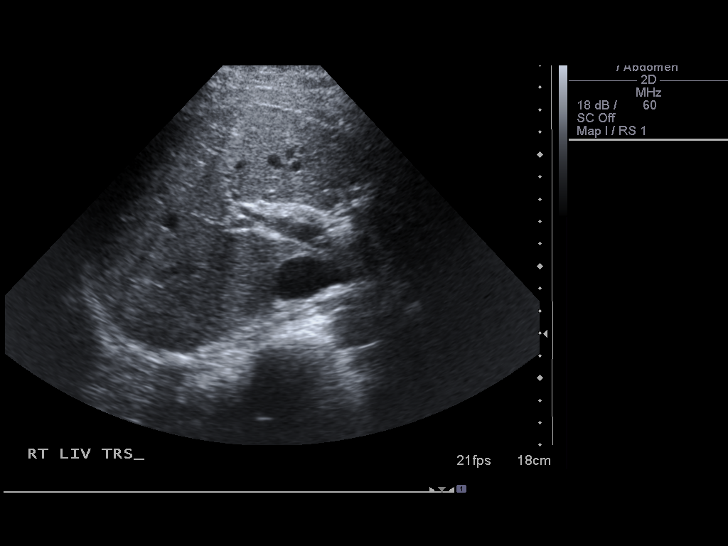
[im 16/48]
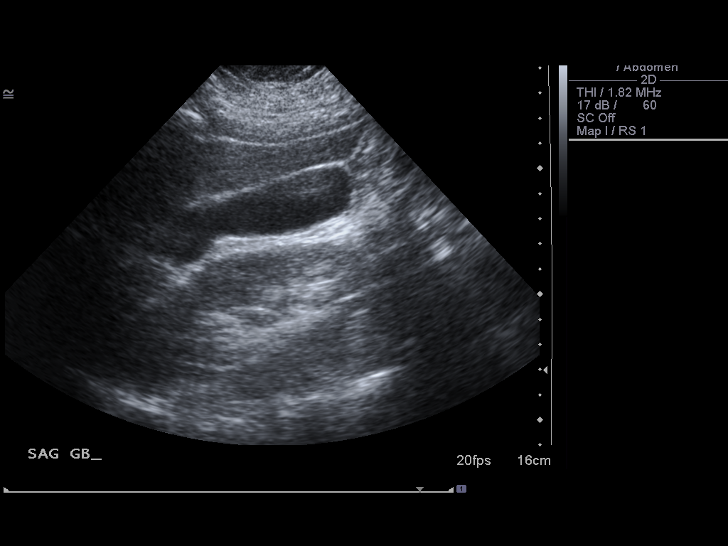
[im 18/48]
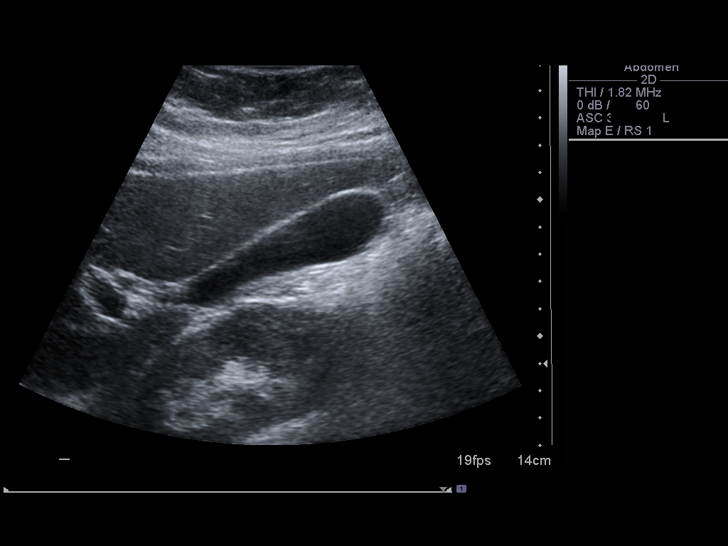
[im 22/48]
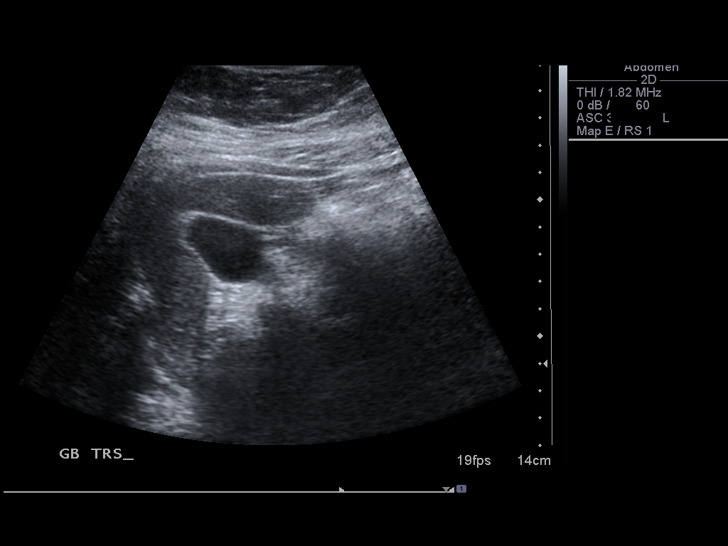
[im 26/48]
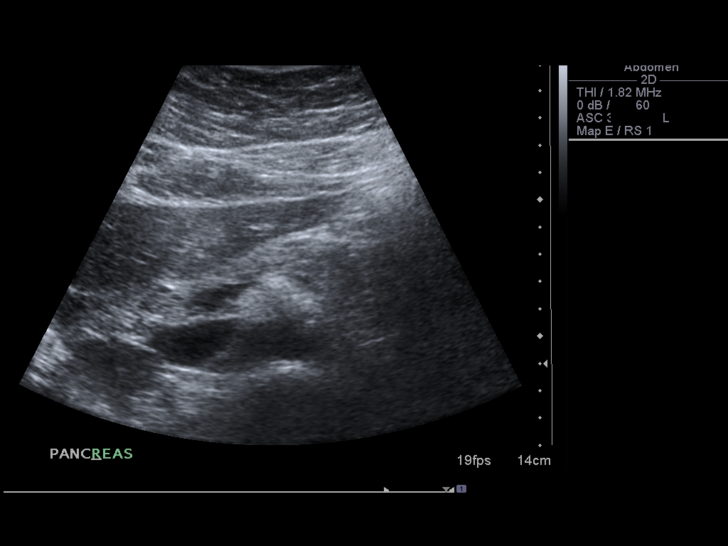
[im 30/48]
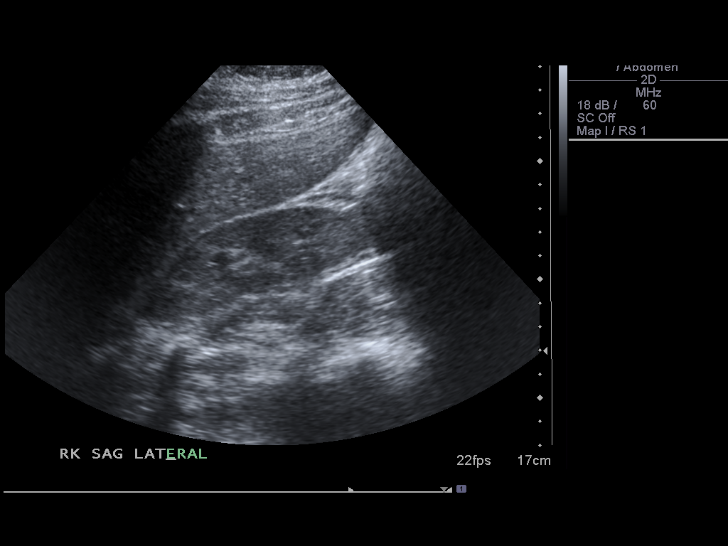
[im 32/48]
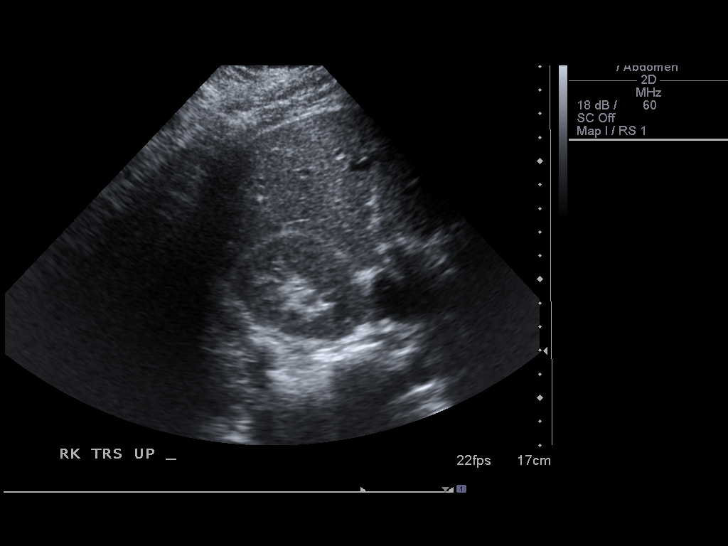
[im 36/48]
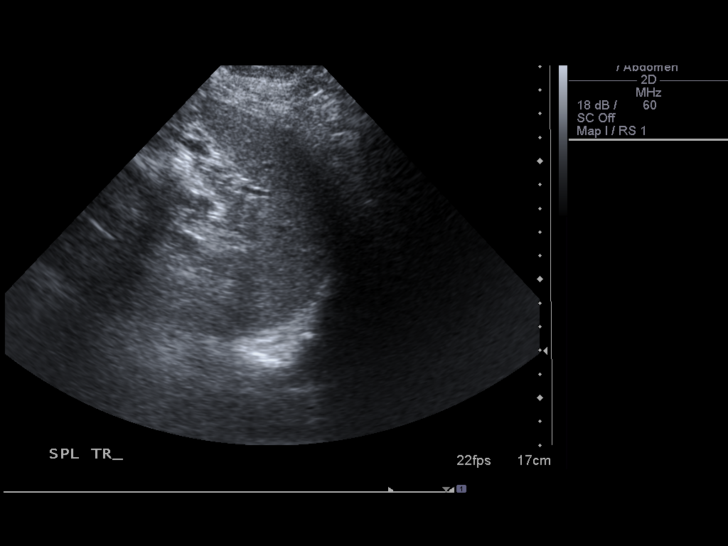
[im 40/48]
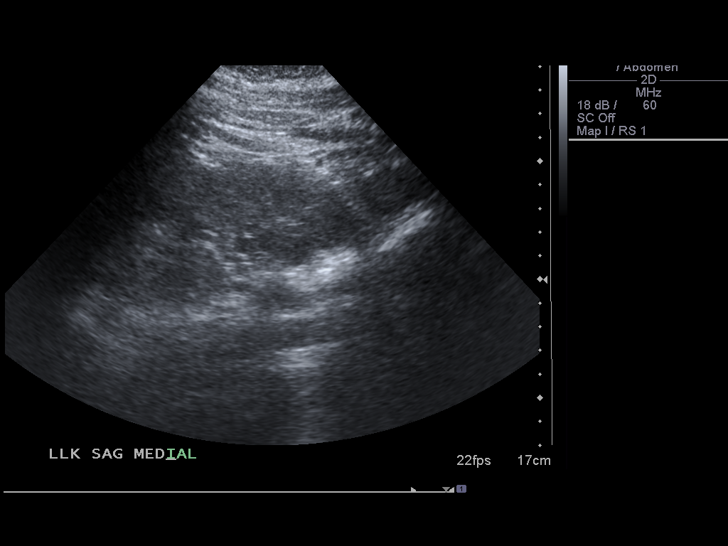
[im 44/48]
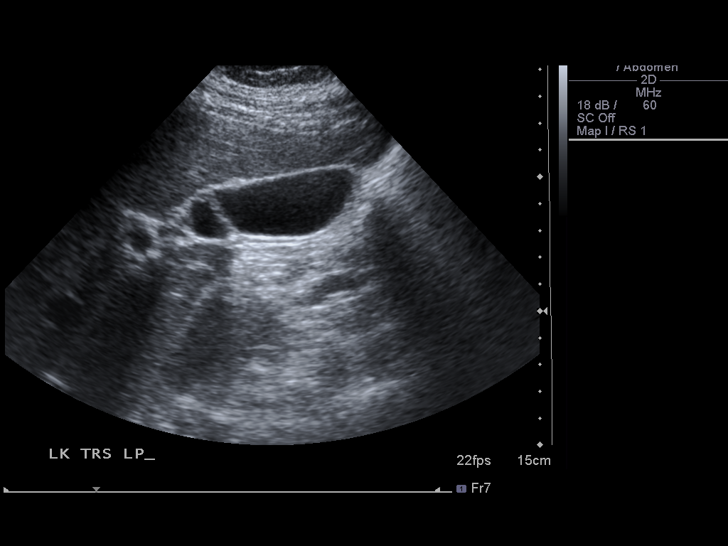
[im 48/48]
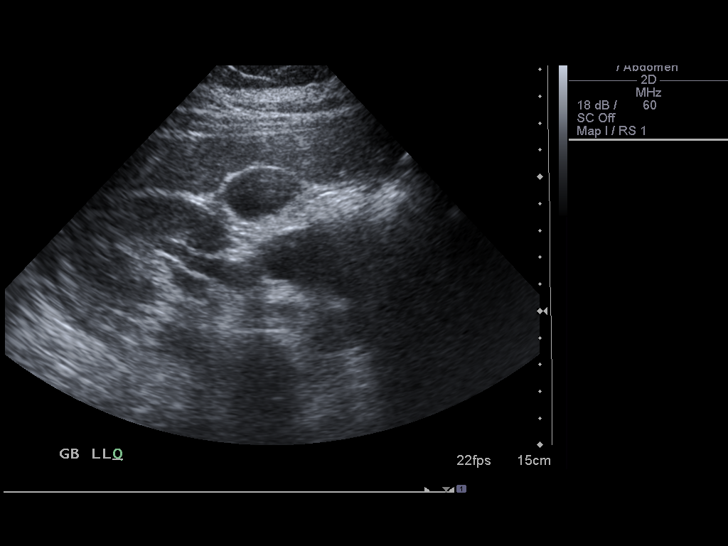

[14 of 25 positions shown; findings below may reference images not displayed]

FINDINGS: Gallbladder:  No gallstones, gallbladder wall thickening, or
pericholecystic fluid.

Common bile duct:   Within normal limits in caliber.

Liver:  No focal lesion identified.  Within normal limits in
parenchymal echogenicity.

IVC:  Appears normal.

Pancreas:  No focal abnormality seen.

Spleen:  Within normal limits in size and echotexture.

Right Kidney:   Normal in size and parenchymal echogenicity.  No
evidence of mass or hydronephrosis.

Left Kidney:  Normal in size and parenchymal echogenicity.  No
evidence of mass or hydronephrosis.

Abdominal aorta:  No aneurysm identified.
IMPRESSION: Negative abdominal ultrasound.

## 2012-05-28 ENCOUNTER — Ambulatory Visit: Payer: Self-pay | Admitting: Medical

## 2012-11-14 ENCOUNTER — Emergency Department (HOSPITAL_COMMUNITY)
Admission: EM | Admit: 2012-11-14 | Discharge: 2012-11-14 | Disposition: A | Discharge status: Home / Self Care | Payer: Self-pay | Attending: Emergency Medicine | Admitting: Emergency Medicine

## 2012-11-14 ENCOUNTER — Encounter (HOSPITAL_COMMUNITY): Payer: Self-pay | Admitting: Emergency Medicine

## 2012-11-14 DIAGNOSIS — Z3202 Encounter for pregnancy test, result negative: Secondary | ICD-10-CM | POA: Insufficient documentation

## 2012-11-14 DIAGNOSIS — R1013 Epigastric pain: Secondary | ICD-10-CM | POA: Insufficient documentation

## 2012-11-14 DIAGNOSIS — R259 Unspecified abnormal involuntary movements: Secondary | ICD-10-CM | POA: Insufficient documentation

## 2012-11-14 DIAGNOSIS — G8929 Other chronic pain: Secondary | ICD-10-CM | POA: Insufficient documentation

## 2012-11-14 DIAGNOSIS — Z8659 Personal history of other mental and behavioral disorders: Secondary | ICD-10-CM | POA: Insufficient documentation

## 2012-11-14 DIAGNOSIS — R253 Fasciculation: Secondary | ICD-10-CM

## 2012-11-14 DIAGNOSIS — M25473 Effusion, unspecified ankle: Secondary | ICD-10-CM | POA: Insufficient documentation

## 2012-11-14 DIAGNOSIS — M25476 Effusion, unspecified foot: Secondary | ICD-10-CM | POA: Insufficient documentation

## 2012-11-14 LAB — URINALYSIS, ROUTINE W REFLEX MICROSCOPIC
Bilirubin Urine: NEGATIVE
Hgb urine dipstick: NEGATIVE
Nitrite: NEGATIVE
Specific Gravity, Urine: 1.021 (ref 1.005–1.030)
Urobilinogen, UA: 1 mg/dL (ref 0.0–1.0)
pH: 7.5 (ref 5.0–8.0)

## 2012-11-14 LAB — URINE MICROSCOPIC-ADD ON

## 2012-11-14 LAB — CBC WITH DIFFERENTIAL/PLATELET
Basophils Relative: 0 % (ref 0–1)
HCT: 31.3 % — ABNORMAL LOW (ref 36.0–46.0)
Hemoglobin: 9.9 g/dL — ABNORMAL LOW (ref 12.0–15.0)
Lymphocytes Relative: 30 % (ref 12–46)
Monocytes Absolute: 0.8 10*3/uL (ref 0.1–1.0)
Monocytes Relative: 8 % (ref 3–12)
Neutro Abs: 5.7 10*3/uL (ref 1.7–7.7)
Neutrophils Relative %: 61 % (ref 43–77)
RBC: 4.34 MIL/uL (ref 3.87–5.11)
WBC: 9.4 10*3/uL (ref 4.0–10.5)

## 2012-11-14 LAB — CALCIUM: Calcium: 9 mg/dL (ref 8.4–10.5)

## 2012-11-14 LAB — BASIC METABOLIC PANEL
CO2: 23 mEq/L (ref 19–32)
Calcium: 9.1 mg/dL (ref 8.4–10.5)
GFR calc non Af Amer: 68 mL/min — ABNORMAL LOW (ref >90)
Glucose, Bld: 101 mg/dL — ABNORMAL HIGH (ref 70–99)
Potassium: 3.6 mEq/L (ref 3.5–5.1)
Sodium: 141 mEq/L (ref 135–145)

## 2012-11-14 LAB — POCT PREGNANCY, URINE: Preg Test, Ur: NEGATIVE

## 2012-11-14 MED ORDER — METHOCARBAMOL 500 MG PO TABS: 500.0000 mg | ORAL_TABLET | Freq: Two times a day (BID) | ORAL | Status: DC

## 2012-11-14 MED ORDER — IBUPROFEN 800 MG PO TABS: 800.0000 mg | ORAL_TABLET | Freq: Three times a day (TID) | ORAL | Status: DC

## 2012-11-14 MED ORDER — LORAZEPAM 2 MG/ML IJ SOLN
1.0000 mg | Freq: Once | INTRAMUSCULAR | Status: AC
  Administered 2012-11-14: 1 mg via INTRAVENOUS
  Filled 2012-11-14: qty 1

## 2012-11-14 MED ORDER — SODIUM CHLORIDE 0.9 % IV SOLN: INTRAVENOUS | Status: DC

## 2012-11-14 MED ORDER — SODIUM CHLORIDE 0.9 % IV SOLN
INTRAVENOUS | Status: DC
  Administered 2012-11-14: 20:00:00 via INTRAVENOUS

## 2012-11-14 MED ORDER — SODIUM CHLORIDE 0.9 % IV BOLUS (SEPSIS)
1000.0000 mL | Freq: Once | INTRAVENOUS | Status: AC
  Administered 2012-11-14: 1000 mL via INTRAVENOUS

## 2012-11-14 NOTE — ED Notes (Signed)
Patient does not have to use the restroom at this time.

## 2012-11-14 NOTE — ED Notes (Signed)
Pt reports that today her left arm and right hand "started shaking". Pt denies CP, SOB, N/V/D or hx of the same. Pt also denies taking OTC/Rx meds and states "I don't take anything." Pt is A&O and in NAD. Pt husband at bedside. Pt has obvious intermittent tremors to L arm but none in L hand at this time.

## 2012-11-14 NOTE — ED Notes (Signed)
Patient states that she has had minimal tremors since receiving the ativan

## 2012-11-14 NOTE — ED Provider Notes (Signed)
CSN: [id number]     Arrival date & time 11/14/12  1804 History     First MD Initiated Contact with Patient 11/14/12 1807     No chief complaint on file.  (Consider location/radiation/quality/duration/timing/severity/associated sxs/prior Treatment) HPI  42 year old female with history of panic attack as a reaction to stress presents complaining of tremors.  Patient reports since 12:00pm today, she has had uncontrollable tremors to her left arm. Symptom has been persistent. Notice some mild tremor to the right hand as well but very minimal. No other specific complaints. Denies fever, chills, pain or rash. She reports that she did travel long distance with her husband to [place] for 3 days last week. She did notice some mild swelling to both ankles for several days. She reports a brief episode of shortness of breath while walking at the mall today only lasting for a few minutes and resolved. No associated chest pain, cough, hemoptysis. She hasn't been eating like normal while on her trip, but denies feeling dehydrated.  No recent medication changes, denies rec drug use or alcohol use.  Not a smoker.    Past Medical History  Diagnosis Date  . 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 Chronic epigastric pain     recurrent discrete episodes   Past Surgical History  Procedure Laterality Date  . Tubal ligation    . Upper gastrointestinal endoscopy  11/25/2010    NORMAL  . Cholecystectomy  2012   Family History  Problem Relation Age of Onset  . Hypertension Father   . Heart attack Brother   . Colon polyps Brother   . Pancreatic cancer Maternal Aunt   . Diabetes Maternal Grandmother   . Colon cancer Neg Hx    History  Substance Use Topics  . Smoking status: Never Smoker   . Smokeless tobacco: Never Used  . Alcohol Use: No     Comment: social   OB History   Grav Para Term Preterm Abortions TAB SAB Ect Mult Living   3 2 2  1  1   2      Review of Systems  All other systems reviewed and  are negative.    Allergies  Review of patient's allergies indicates no known allergies.  Home Medications   Current Outpatient Rx  Name  Route  Sig  Dispense  Refill  . ibuprofen (ADVIL,MOTRIN) 200 MG tablet   Oral   Take 400 mg by mouth every 6 (six) hours as needed. Takes for pain          There were no vitals taken for this visit. Physical Exam  Nursing note and vitals reviewed. Constitutional: She is oriented to person, place, and time. She appears well-developed and well-nourished. No distress.  Nontoxic in appearance  HENT:  Head: Atraumatic.  Mouth/Throat: Oropharynx is clear and moist.  Eyes: Conjunctivae are normal.  Neck: Neck supple. No JVD present.  Cardiovascular:  Tachycardia without murmurs, rubs, or gallops  Pulmonary/Chest: Effort normal and breath sounds normal. She has no wheezes. She has no rales.  Abdominal: Soft. There is no tenderness.  Musculoskeletal: She exhibits no edema and no tenderness.  Bilateral extremities without palpable cords, edema, erythema, negative Homans sign.  Intact distal pulses.  Left upper extremities with sporadic muscle fasciculation, not intentional. Palpable radial pulses, normal grip strength, normal sensation with brisk cap refills.  Right hand is normal, no fasciculation, normal grip.  Neurological: She is alert and oriented to person, place, and time.  Skin: Skin  is warm. No rash noted.  Psychiatric: She has a normal mood and affect.    ED Course   Procedures (including critical care time)   Date: 11/14/2012  Rate: 101  Rhythm: sinus tach  QRS Axis: normal  Intervals: normal  ST/T Wave abnormalities: normal  Conduction Disutrbances: none  Narrative Interpretation:   Old EKG Reviewed: No significant changes noted     6:37 PM]  patient with unintentional muscle fasciculation noted to the left arm. Plan to give Ativan, check electrolytes, and fluid hydration.  She is also tachycardic, and had recent  long travel. She otherwise denies chest pain, Shortness of breath, DOE, hemoptysis, history of cancer, recent surgery.  We do not suspect PE at this time. Plan to continue hydration and monitor. If patient's tachycardia persists, will consider obtaining d-dimer to rule out PE. Discussed with attending who has evaluated the patient.  9:21 PM Tachycardia resolved after IVF.  Low suspicion for PE.  Muscle fasciculation improves after ativan and IVF.  Pt report she has been carrying her nephew using her left arm for an hr, she does not normally carry him. Unsure if this may related to her muscle fasciculation.  Doubt stroke.  Electrolytes are reassuring.  Plan to give muscle relaxant and outpt resource for f/u.  Pt agrees to return if sxs worsen.  Attending aware.    Labs Reviewed - No data to display No results found. 1. Fasciculations of muscle     MDM  BP 113/68  Pulse 94  Temp(Src) 100.3 F (37.9 C) (Oral)  Resp 24  SpO2 100%  LMP 10/24/2012   Fayrene Helper, PA-C 11/14/12 2126

## 2012-11-15 NOTE — ED Provider Notes (Signed)
Medical screening examination/treatment/procedure(s) were conducted as a shared visit with non-physician practitioner(s) and myself.  I personally evaluated the patient during the encounter  Patient presents with tremor of the left upper extremity and right hand.  Incidentally noted to be tachycardic in the 120-130s (baseline 90s).  Patient denies any CP, SOB.  States that she feels like she may be dehydrated.  NEuro exam only notable for tremor.  HR and tremor improved with fluids and ativan.  Electrolytes wnl.    After history, exam, and medical workup I feel the patient has been appropriately medically screened and is safe for discharge home. Pertinent diagnoses were discussed with the patient. Patient was given return precautions.   Shon Baton, MD 11/15/12 [phone]

## 2013-07-18 ENCOUNTER — Encounter (HOSPITAL_COMMUNITY): Payer: Self-pay | Admitting: Emergency Medicine

## 2013-07-18 ENCOUNTER — Emergency Department (HOSPITAL_COMMUNITY)
Admission: EM | Admit: 2013-07-18 | Discharge: 2013-07-18 | Disposition: A | Discharge status: Home / Self Care | Payer: 59 | Attending: Emergency Medicine | Admitting: Emergency Medicine

## 2013-07-18 ENCOUNTER — Emergency Department (HOSPITAL_COMMUNITY): Payer: 59

## 2013-07-18 DIAGNOSIS — B349 Viral infection, unspecified: Secondary | ICD-10-CM

## 2013-07-18 DIAGNOSIS — J040 Acute laryngitis: Secondary | ICD-10-CM | POA: Insufficient documentation

## 2013-07-18 DIAGNOSIS — Z8659 Personal history of other mental and behavioral disorders: Secondary | ICD-10-CM | POA: Insufficient documentation

## 2013-07-18 DIAGNOSIS — J309 Allergic rhinitis, unspecified: Secondary | ICD-10-CM | POA: Insufficient documentation

## 2013-07-18 DIAGNOSIS — Z79899 Other long term (current) drug therapy: Secondary | ICD-10-CM | POA: Insufficient documentation

## 2013-07-18 DIAGNOSIS — Z9109 Other allergy status, other than to drugs and biological substances: Secondary | ICD-10-CM

## 2013-07-18 DIAGNOSIS — B9789 Other viral agents as the cause of diseases classified elsewhere: Secondary | ICD-10-CM | POA: Insufficient documentation

## 2013-07-18 DIAGNOSIS — G8929 Other chronic pain: Secondary | ICD-10-CM | POA: Insufficient documentation

## 2013-07-18 LAB — RAPID STREP SCREEN (MED CTR MEBANE ONLY): STREPTOCOCCUS, GROUP A SCREEN (DIRECT): NEGATIVE

## 2013-07-18 MED ORDER — AZITHROMYCIN 250 MG PO TABS: 250.0000 mg | ORAL_TABLET | Freq: Every day | ORAL | Status: DC

## 2013-07-18 MED ORDER — CETIRIZINE-PSEUDOEPHEDRINE ER 5-120 MG PO TB12: 1.0000 | ORAL_TABLET | Freq: Every day | ORAL | Status: DC

## 2013-07-18 NOTE — ED Notes (Signed)
Patient transported to X-ray 

## 2013-07-18 NOTE — ED Provider Notes (Signed)
CSN: [id number]     Arrival date & time 07/18/13  1049 History   First MD Initiated Contact with Patient 07/18/13 1058     Chief Complaint  Patient presents with  . Cough  . Sore Throat     (Consider location/radiation/quality/duration/timing/severity/associated sxs/prior Treatment) The history is provided by the patient. No language interpreter was used.  Victoria Bender is a 43 year old female with past medical history of chronic epigastric pain, panic attacks and anxiety presenting to the ED with sore throat and cough. Patient reported that for the past 2 weeks she's been having cold-like symptoms-nasal congestion, cough, bodyaches. Reported proximally one week ago she started to have sore throat, reported the discomfort as burning sensation as if her throat is "on fire." Stated that she feels as if something is stuck in her throat, reported discomfort with swallowing. Stated that she has a nonproductive cough stated that it is mainly dry. Stated that her daughter got sick approximately 3 weeks ago before she did. Reported that she's been using tea, Hallz lozenges, Robitussin. Stated that a couple of days ago, 2 days ago, noticed that her voice has become raspy. Denied difficulty swallowing, fever, chills, abdominal pain, nausea, vomiting, diarrhea, chest pain, shortness of breath, difficulty breathing, neck pain, back pain, neck stiffness. Denied cigarette use. PCP none  Past Medical History  Diagnosis Date  . 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 Chronic epigastric pain     recurrent discrete episodes   Past Surgical History  Procedure Laterality Date  . Tubal ligation    . Upper gastrointestinal endoscopy  11/25/2010    NORMAL  . Cholecystectomy  2012   Family History  Problem Relation Age of Onset  . Hypertension Father   . Heart attack Brother   . Colon polyps Brother   . Pancreatic cancer Maternal Aunt   . Diabetes Maternal Grandmother   . Colon cancer Neg Hx     History  Substance Use Topics  . Smoking status: Never Smoker   . Smokeless tobacco: Never Used  . Alcohol Use: No     Comment: social   OB History   Grav Para Term Preterm Abortions TAB SAB Ect Mult Living   3 2 2  1  1   2      Review of Systems  Constitutional: Negative for fever and chills.  HENT: Positive for congestion, sinus pressure and sore throat. Negative for trouble swallowing.   Respiratory: Positive for cough. Negative for chest tightness and shortness of breath.   Gastrointestinal: Negative for nausea, vomiting and abdominal pain.  Musculoskeletal: Negative for back pain, myalgias, neck pain and neck stiffness.  Neurological: Negative for dizziness, weakness and headaches.  All other systems reviewed and are negative.     Allergies  Review of patient's allergies indicates no known allergies.  Home Medications   Prior to Admission medications   Medication Sig Start Date End Date Taking? Authorizing Provider  ibuprofen (ADVIL,MOTRIN) 800 MG tablet Take 1 tablet (800 mg total) by mouth 3 (three) times daily. 11/14/12   Domenic Moras, PA-C  methocarbamol (ROBAXIN) 500 MG tablet Take 1 tablet (500 mg total) by mouth 2 (two) times daily. 11/14/12   Domenic Moras, PA-C   BP 109/78  Pulse 95  Temp(Src) 98.9 F (37.2 C) (Oral)  Resp 18  Ht 5\' 5"  (1.651 m)  Wt 230 lb (104.327 kg)  BMI 38.27 kg/m2  SpO2 100%  LMP 06/27/2013 Physical Exam  Nursing note and vitals  reviewed. Constitutional: She is oriented to person, place, and time. She appears well-developed and well-nourished. No distress.  Patient sitting comfortably in chair, no signs of distress  HENT:  Head: Normocephalic and atraumatic.  Right Ear: Hearing, tympanic membrane, external ear and ear canal normal. No mastoid tenderness.  Left Ear: Hearing, tympanic membrane, external ear and ear canal normal. No mastoid tenderness.  Mouth/Throat: Uvula is midline, oropharynx is clear and moist and mucous membranes  are normal. No trismus in the jaw. No dental abscesses or uvula swelling. No oropharyngeal exudate.  Ears: Negative pre-and postauricular swelling identified. Negative mastoid swelling or tenderness noted upon palpation. Negative warmth. Unremarkable ear exam.  Mouth: Negative swelling, erythema, inflammation, lesions, sores, petechiae, exudate noted to the tonsils and posterior oropharynx. Uvula midline with symmetrical elevation. Negative deviation of the uvula. Negative uvula swelling. Negative trismus. Negative muffled potato voice.  Eyes: Conjunctivae and EOM are normal. Pupils are equal, round, and reactive to light. Right eye exhibits no discharge. Left eye exhibits no discharge.  Neck: Normal range of motion. Neck supple. No tracheal deviation present.  Negative neck stiffness Negative nuchal rigidity Negative cervical lymphadenopathy Negative meningeal signs  Cardiovascular: Normal rate, regular rhythm and normal heart sounds.  Exam reveals no friction rub.   No murmur heard. Pulses:      Radial pulses are 2+ on the right side, and 2+ on the left side.  Cap refill less than 3 seconds  Pulmonary/Chest: Effort normal and breath sounds normal. No respiratory distress. She has no wheezes. She has no rales.  Patient is able to speak in full sentences without difficulty Negative use of accessory muscles Negative stridor  Musculoskeletal: Normal range of motion. She exhibits no edema and no tenderness.  Full ROM to upper and lower extremities without difficulty noted, negative ataxia noted.  Lymphadenopathy:    She has no cervical adenopathy.  Neurological: She is alert and oriented to person, place, and time. No cranial nerve deficit. She exhibits normal muscle tone. Coordination normal.  Skin: Skin is warm and dry. No rash noted. She is not diaphoretic. No erythema.  Psychiatric: She has a normal mood and affect. Her behavior is normal. Thought content normal.    ED Course   Procedures (including critical care time) Labs Review Labs Reviewed  RAPID STREP SCREEN  CULTURE, GROUP A STREP    Imaging Review Dg Chest 2 View  07/18/2013   CLINICAL DATA:  Cough.  EXAM: CHEST  2 VIEW  COMPARISON:  November 13, 2006.  FINDINGS: The heart size and mediastinal contours are within normal limits. Both lungs are clear. No pneumothorax or pleural effusion is noted. The visualized skeletal structures are unremarkable.  IMPRESSION: No acute cardiopulmonary abnormality seen.   Electronically Signed   By: Sabino Dick M.D.   On: 07/18/2013 11:33     EKG Interpretation None      MDM   Final diagnoses:  Viral syndrome  Laryngitis  Environmental allergies   Filed Vitals:   07/18/13 1100  BP: 109/78  Pulse: 95  Temp: 98.9 F (37.2 C)  TempSrc: Oral  Resp: 18  Height: 5\' 5"  (1.651 m)  Weight: 230 lb (104.327 kg)  SpO2: 100%    Patient presenting to the ED with cold-like symptoms is been ongoing for the past 3 weeks with worsening sore throat x1 week. Stated that her cough is dry. Reported that she does continue to have nasal congestion. Reported that she's been using tea, Robitussin, lozenges without relief. Reported  that her daughter was sick prior to her becoming sick. Alert and oriented. GCS 15. Heart rate and rhythm normal. Lungs good auscultation to upper and lower lobes bilaterally. Patient is able to speak in full sentences without difficulty. Negative use of accessory muscles. Negative stridor. Unremarkable oral exam-negative swelling, erythema, formation, lesions, sores petechiae noted to the posterior oropharynx and tonsils. Uvula midline with symmetrical elevation, negative uvula swelling. Negative trismus. Unremarkable ear exam. Negative cervical lymphadenopathy-negative pain upon palpation to the neck - supple full range of motion. Full range of motion to upper and lower extremities bilaterally without difficulty noted. Cap refill less than 3 seconds. Rapid  strep negative. Chest x-ray negative for acute cardiopulmonary disease. Doubt pneumonia. Doubt pneumothorax. Doubt peritonsillar abscess. Doubt retropharyngeal abscess. Doubt streptococcal pharyngitis. Suspicion to be laryngitis secondary to viral syndrome. Cannot rule out possible allergies. Patient stable, afebrile. Patient in no respiratory distress. Patient does not appear septic. Discharged patient. Discussed with patient supportive therapy. Discussed with patient to rest and stay hydrated. Discussed with patient to avoid any physical strenuous activity. Discussed with patient to followup with health and wellness Center. Discussed with patient to rest the voice. Discussed with patient to closely monitor symptoms and if symptoms are to worsen or change to report back to the ED - strict return instructions given.  Patient agreed to plan of care, understood, all questions answered.   Jamse Mead, PA-C 07/18/13 2125

## 2013-07-18 NOTE — Discharge Instructions (Signed)
Please call your doctor for a followup appointment within 24-48 hours. When you talk to your doctor please let them know that you were seen in the emergency department and have them acquire all of your records so that they can discuss the findings with you and formulate a treatment plan to fully care for your new and ongoing problems. Please call and set-up an appointment with health and wellness Center Please take azithromycin as prescribed for you to upper respiratory infection Please rest and stay hydrated Please continue to monitor symptoms closely if symptoms are to worsen or change (fever greater than 101, chills, chest pain, shortness of breath, difficulty breathing, numbness, tingling, nausea, vomiting, diarrhea, neck pain, worsening sore throat, inability to swallow, visual changes) please report back to the ED immediately  Upper Respiratory Infection, Adult An upper respiratory infection (URI) is also known as the common cold. It is often caused by a type of germ (virus). Colds are easily spread (contagious). You can pass it to others by kissing, coughing, sneezing, or drinking out of the same glass. Usually, you get better in 1 or 2 weeks.  HOME CARE   Only take medicine as told by your doctor.  Use a warm mist humidifier or breathe in steam from a hot shower.  Drink enough water and fluids to keep your pee (urine) clear or pale yellow.  Get plenty of rest.  Return to work when your temperature is back to normal or as told by your doctor. You may use a face mask and wash your hands to stop your cold from spreading. GET HELP RIGHT AWAY IF:   After the first few days, you feel you are getting worse.  You have questions about your medicine.  You have chills, shortness of breath, or brown or red spit (mucus).  You have yellow or brown snot (nasal discharge) or pain in the face, especially when you bend forward.  You have a fever, puffy (swollen) neck, pain when you swallow, or  white spots in the back of your throat.  You have a bad headache, ear pain, sinus pain, or chest pain.  You have a high-pitched whistling sound when you breathe in and out (wheezing).  You have a lasting cough or cough up blood.  You have sore muscles or a stiff neck. MAKE SURE YOU:   Understand these instructions.  Will watch your condition.  Will get help right away if you are not doing well or get worse. Document Released: 09/03/2007 Document Revised: 06/09/2011 Document Reviewed: 07/22/2010 [facility] Patient Information 2014 [place], [place]. Laryngitis Laryngitis is redness, soreness, and puffiness (inflammation) of the vocal cords. It causes hoarseness, cough, loss of voice, sore throat, and dry throat. It may be caused by:  Infection.  Too much smoking.  Too much talking or yelling.  Breathing in of toxic fumes.  Allergies.  A backup of acid from your stomach. HOME CARE  Drink enough fluids to keep your pee (urine) clear or pale yellow.  Rest until you no longer have problems or as told by your doctor.  Breathe in moist air.  Take all medicine as told by your doctor.  Do not smoke.  Talk as little as possible (this includes whispering).  Write on paper instead of talking until your voice is back to normal.  Follow up with your doctor if you have not improved after 10 days. GET HELP IF:   You have trouble breathing.  You cough up blood.  You have a fever  that will not go away.  You have increasing pain.  You have trouble swallowing. MAKE SURE YOU:  Understand these instructions.  Will watch your condition.  Will get help right away if you are not doing well or get worse. Document Released: 03/06/2011 Document Revised: 06/09/2011 Document Reviewed: 03/06/2011 [facility] Patient Information 2014 [place], [place].   Emergency Department Resource Guide 1) Find a Doctor and Pay Out of Pocket Although you won't have to find out who is covered by  your insurance plan, it is a good idea to ask around and get recommendations. You will then need to call the office and see if the doctor you have chosen will accept you as a new patient and what types of options they offer for patients who are self-pay. Some doctors offer discounts or will set up payment plans for their patients who do not have insurance, but you will need to ask so you aren't surprised when you get to your appointment.  2) Contact Your Local Health Department Not all health departments have doctors that can see patients for sick visits, but many do, so it is worth a call to see if yours does. If you don't know where your local health department is, you can check in your phone book. The CDC also has a tool to help you locate your state's health department, and many state websites also have listings of all of their local health departments.  3) Find a [facility] If your illness is not likely to be very severe or complicated, you may want to try a walk in clinic. These are popping up all over the country in pharmacies, drugstores, and shopping centers. They're usually staffed by nurse practitioners or physician assistants that have been trained to treat common illnesses and complaints. They're usually fairly quick and inexpensive. However, if you have serious medical issues or chronic medical problems, these are probably not your best option.  No Primary Care Doctor: - Call Health Connect at  [phone] - they can help you locate a primary care doctor that  accepts your insurance, provides certain services, etc. - Physician Referral Service- [phone]  Chronic Pain Problems: Organization         Address  Phone   Notes  [facility]  [phone] Patients need to be referred by their primary care doctor.   Medication Assistance: Organization         Address  Phone   Notes  [facility] Medication [facility] [address] [place], [place] [address] [phone] --Must be a resident of [facility] -- Must have NO insurance coverage whatsoever (no Medicaid/ Medicare, etc.) -- The pt. MUST have a primary care doctor that directs their care regularly and follows them in the community   MedAssist  [phone]   [employer]  [phone]    Agencies that provide inexpensive medical care: Organization         Address  Phone   Notes  [place]  [phone]   [facility]    [phone]   [facility] [place], [place] [address] [phone]   [place] [address], [place] [phone]   Planned Parenthood    [phone]   [facility]    [phone]   [place] and [address], [place] Phone:  [phone], Fax:  [phone] Hours of Operation:  9 am - 6 pm, M-F.  Also accepts Medicaid/Medicare and self-pay.  [facility] for [place], [address], [place] Phone: [phone], Fax: [phone]. Hours of Operation:  8:30 am - 5:30 pm, M-F.  Also accepts Medicaid and self-pay.  [facility] [address] [place] Phone: [phone]   [place], [place], [place] [phone], Ext. [phone] Mondays & Thursdays: 7-9 AM.  First 15 patients are seen on a first come, first serve basis.    [place] Providers:  Organization         Address  Phone   Notes  [facility] [address], [place] [phone] Also accepts self-pay patients.  [facility] [id number] [place], [place]  [phone]   [place], [address], [place] [phone]   [facility] [address] [place] [phone]   Lucianne Lei [address], [place]   [phone] Only accepts [place] Access [place] patients after they have their name applied to their card.   Self-Pay (no insurance) in [facility]:  Organization         Address  Phone   Notes  Sickle Cell Patients, [facility] Internal Medicine [place] [phone]   [facility] Urgent Care [place] [phone]   [facility] [place]  [place], [place], [place] [phone]   [facility]/Dr. Osei-Bonsu  [address], [place] or [address], [place] [phone] Phone number for both [place] and [place] locations is the same.  Urgent Medical and [facility] [address], [place] [phone]   [facility] [address] [place] or [address] [phone]   [facility] [address], [place] [phone], phone; [phone], fax Sees patients 1st and 3rd Saturday of every month.  Must not qualify for public or private insurance (i.e. Medicaid, Medicare, [place] Health Choice, Veterans' Benefits)  Household income should be no more than 200% of the poverty level The clinic cannot treat you if you are pregnant or think you are pregnant  Sexually transmitted diseases are not treated at the clinic.    Dental Care: Organization         Address  Phone  Notes  [facility] Department of [facility] [place] [phone] Accepts children up to age 64 who are enrolled in [place] or [place]; pregnant women with a Medicaid card; and children who have applied for Medicaid or [place] Health Choice, but were declined, whose parents can pay a reduced fee at time of service.  [facility] Department of [facility]  [address], [place] [phone] Accepts children up to age 60 who are enrolled  in [place] or [place]; pregnant women with a Medicaid card; and children who have applied for Medicaid or [place] Health Choice, but were declined, whose parents can pay a reduced fee at time of service.  [place] Adult Dental Access PROGRAM  [place] [phone] Patients are seen by appointment only. Walk-ins are not accepted. [place]  Dental will see patients 45 years of age and older. Monday - Tuesday (8am-5pm) Most Wednesdays (8:30-5pm) $30 per visit, cash only  [facility] Adult Dental Access PROGRAM  [address], [facility] [phone] Patients are seen by appointment only. Walk-ins are not accepted. [place] will see patients 54 years of age and older. One Wednesday Evening (Monthly: Volunteer Based).  $30 per visit, cash only  [place]  [phone] for adults; Children under age 65, call Graduate Pediatric Dentistry at [phone]. Children aged 41-14, please call [phone] to request a pediatric application.  Dental services are provided in all areas of dental care including fillings, crowns and bridges, complete and partial dentures, implants, gum treatment, root canals, and extractions. Preventive care is also provided. Treatment is provided to both adults and children. Patients are selected via a lottery and there is often a waiting list.   [facility] [address] [place]  [phone] [email or web address]   [facility] [address] [place], [place] [phone], Ext. [phone] Second and Fourth Thursday of each month, opens at 6:30 AM; Clinic ends at 9 AM.  Patients are seen on a first-come first-served basis, and a limited number are seen during each clinic.   [facility]  [address] Hillard Danker [place], [place] [phone]   Eligibility Requirements You must have lived in [place], [place], or [place] counties for at least the last three months.   You cannot be  eligible for state or federal sponsored [employer], including [employer], [place], or [employer].   You generally cannot be eligible for healthcare insurance through your employer.    How to apply: Eligibility screenings are held every Tuesday and Wednesday afternoon from 1:00 pm until 4:00 pm. You do not need an appointment for the interview!  [facility] [address] [place], [place]   [place]  [place] Department  [place]  [phone]    Behavioral Health Resources in the Community: Intensive Outpatient Programs Organization         Address  Phone  Notes  [place] [address], [place], [place] [phone]   [facility] Outpatient [address] [place], [place]   ADS: Alcohol & Drug Svcs [address] [place], [place]   [place] [address],  [place], [place] or [phone]   Substance Abuse Resources Organization         Address  Phone  Notes  Alcohol and Drug Services  [phone]   [place]  [phone]   [facility] [place]   Chinita Pester  [phone]   Residential & Outpatient Substance Abuse Program  [phone]   Psychological Services Organization         Address  Phone  Notes  [facility] [place]  [place]  [phone]   [place] [address], [place] or [phone]    Mobile Crisis Teams Organization         Address  Phone  Notes  [facility], Mobile Crisis Care Unit  [phone]   [facility] Psychotherapeutic Services  [address] [place], [place]   [facility] [address] [place] [phone]    Self-Help/Support Groups Organization  Address  Phone             Notes  [facility] [place] - variety of support groups  [place] Call for more information  Narcotics Anonymous (NA), [facility] [address], [employer] [place]  2 meetings at this location   Special educational needs teacher         Address  Phone  Notes  ASAP Residential Treatment [place],    [place]  [phone]   [facility]  [address], [place] [id number], [place], [place]   [place], [place] [phone] Admissions: 8am-3pm M-F  Incentives Substance [place] [address]    [place], [place] [phone]   [facility] [address] [place], [place], [place]   [facility] [address]  [place], [place]   [facility] Programs - Intensive Outpatient [address], [place], [place]   [facility] ([place] [address]  [place], [place] [phone] or [phone]   [facility] Services (RTS) [address], [place], [place] Accepts Medicaid  [facility] [place] [address],  [place] [phone] Substance Abuse/Addiction Treatment   [facility] Organization         Address  Phone  Notes  [facility]  [phone]   Domenic Schwab, PhD [address] Arlis [place], [place]   [phone] or [phone]   [facility] [place] [place], [place] [phone]   [facility] [address] [place], [place] [phone] Insurance/Medicaid/sponsorship through [facility] and Families [address] [place]                                    [place], [place] [phone] [place] [address], [place] [phone]    Dr. Adele Schilder  [phone]   [facility] Dept. 1) [address] [place] 2) [place] 3)  [place] [address], [place] [phone]  [phone]   [place] [phone] or [phone] (After Hours)

## 2013-07-18 NOTE — ED Notes (Signed)
Pt states for the past 2 weeks she's had a cough that had made her throat sore, states last night she felt like her throat was on fire, pt states pain is in middle of throat, pt also states she has lost her voice x 2 days, pt has taken robitussin and halls cough drops w/ no relief.

## 2013-07-19 NOTE — ED Provider Notes (Signed)
Medical screening examination/treatment/procedure(s) were performed by non-physician practitioner and as supervising physician I was immediately available for consultation/collaboration.   EKG Interpretation None       Mili Piltz, MD 07/19/13 0700 

## 2013-07-20 LAB — CULTURE, GROUP A STREP

## 2014-01-30 ENCOUNTER — Encounter (HOSPITAL_COMMUNITY): Payer: Self-pay | Admitting: Emergency Medicine

## 2014-02-12 ENCOUNTER — Encounter (HOSPITAL_COMMUNITY): Payer: Self-pay | Admitting: Emergency Medicine

## 2014-02-12 ENCOUNTER — Emergency Department (HOSPITAL_COMMUNITY)
Admission: EM | Admit: 2014-02-12 | Discharge: 2014-02-12 | Disposition: A | Discharge status: Home / Self Care | Payer: 59 | Attending: Emergency Medicine | Admitting: Emergency Medicine

## 2014-02-12 DIAGNOSIS — Z79899 Other long term (current) drug therapy: Secondary | ICD-10-CM | POA: Insufficient documentation

## 2014-02-12 DIAGNOSIS — Z792 Long term (current) use of antibiotics: Secondary | ICD-10-CM | POA: Insufficient documentation

## 2014-02-12 DIAGNOSIS — F410 Panic disorder [episodic paroxysmal anxiety] without agoraphobia: Secondary | ICD-10-CM

## 2014-02-12 DIAGNOSIS — G8929 Other chronic pain: Secondary | ICD-10-CM | POA: Insufficient documentation

## 2014-02-12 MED ORDER — LORAZEPAM 1 MG PO TABS
1.0000 mg | ORAL_TABLET | Freq: Once | ORAL | Status: AC
  Administered 2014-02-12: 1 mg via ORAL
  Filled 2014-02-12: qty 1

## 2014-02-12 MED ORDER — ALPRAZOLAM 0.5 MG PO TABS: 0.5000 mg | ORAL_TABLET | Freq: Every evening | ORAL | Status: DC | PRN

## 2014-02-12 NOTE — ED Notes (Signed)
Bed: WTR5 Expected date:  Expected time:  Means of arrival:  Comments: 

## 2014-02-12 NOTE — Discharge Instructions (Signed)
°Emergency Department Resource Guide °1) Find a Doctor and Pay Out of Pocket °Although you won't have to find out who is covered by your insurance plan, it is a good idea to ask around and get recommendations. You will then need to call the office and see if the doctor you have chosen will accept you as a new patient and what types of options they offer for patients who are self-pay. Some doctors offer discounts or will set up payment plans for their patients who do not have insurance, but you will need to ask so you aren't surprised when you get to your appointment. ° °2) Contact Your Local Health Department °Not all health departments have doctors that can see patients for sick visits, but many do, so it is worth a call to see if yours does. If you don't know where your local health department is, you can check in your phone book. The CDC also has a tool to help you locate your state's health department, and many state websites also have listings of all of their local health departments. ° °3) Find a Walk-in Clinic °If your illness is not likely to be very severe or complicated, you may want to try a walk in clinic. These are popping up all over the country in pharmacies, drugstores, and shopping centers. They're usually staffed by nurse practitioners or physician assistants that have been trained to treat common illnesses and complaints. They're usually fairly quick and inexpensive. However, if you have serious medical issues or chronic medical problems, these are probably not your best option. ° °No Primary Care Doctor: °- Call [facility] at  832-8000 - they can help you locate a primary care doctor that  accepts your insurance, provides certain services, etc. °- Physician Referral Service- 1-800-533-3463 ° °Chronic Pain Problems: °Organization         Address  Phone   Notes  °[facility]  (336) 297-2271 Patients need to be referred by their primary care doctor.  ° °Medication  Assistance: °Organization         Address  Phone   Notes  °[place] [facility] [address] °[place], [place] [address] (336) 641-8030 --Must be a resident of [place] °-- Must have NO insurance coverage whatsoever (no Medicaid/ Medicare, etc.) °-- The pt. MUST have a primary care doctor that directs their care regularly and follows them in the community °  °[facility]  (866) 331-1348   °[employer]  (888) 892-1162   ° °Agencies that provide inexpensive medical care: °Organization         Address  Phone   Notes  °[facility]  (336) 832-8035   °[facility]    (336) 832-7272   °[facility] [address] °[place], [place] [address] (336) 832-4777   °[facility] [address], °[place] (336) 271-4999   °[facility]    (336) 373-0678   °[facility]    (336) 272-1050   °[facility] ° [address], [place] Phone:  (336) 832-4444, Fax:  (336) 832-4440 Hours of Operation:  9 am - 6 pm, M-F.  Also accepts Medicaid/Medicare and self-pay.  °[facility] ° [address], [place] Phone: (336) 832-3150, Fax: (336) 832-3151. Hours of Operation:  8:30 am - 5:30 pm, M-F.  Also accepts Medicaid and self-pay.  °[facility] [place] [address], [place] Phone: (336) 878-6027   °[facility] [address], [place], [place] (336)723-1848, Ext. 123 Mondays & Thursdays: 7-9 AM.  First 15 patients are seen on a first come, first serve basis. °  ° °Medicaid-accepting [place] Providers: ° °Organization         Address  Phone   Notes  °[facility] [address], [place] (336) 641-2100 Also accepts self-pay patients.  °[facility] [address], [place] ° (336) 856-9996   °[facility] [address], [place]  (336) 288-8857   °[facility] [address] [place] [address], [place] (336) 299-7000   °Veita Bland [address], [place]  ° (336) 373-1557 Only accepts [place] [facility] Medicaid patients after they have their name applied to their card.  ° °Self-Pay (no insurance) in [place]: ° °Organization         Address  Phone   Notes  °Sickle Cell Patients, [facility] [address], [place] (336) 832-1970   °[facility] [address], [place] (336) 832-4400   °[facility] [place] ° [address], [place] (336) 992-4800   °[facility]/Dr. Osei-Bonsu ° [address] [place] [address], [place] or [address], [place] (336) 841-8500 Phone number for both [place] and [place] locations is the same.  °[facility] [address], [place] (336) 299-0000   °[facility] [address] [place] [address], [place] or [address] (336) 852-7530 °(336) 878-2260   °[facility] [address], [place] (336) 350-1642, phone; (336) 294-5005, fax Sees patients 1st and 3rd Saturday of every month.  Must not qualify for public or private insurance (i.e. Medicaid, Medicare, [place] Health Choice, Veterans' Benefits) • Household income should be no more than 200% of the poverty level •The clinic cannot treat you if you are pregnant or think you are pregnant • Sexually transmitted diseases are not treated at the clinic.  ° ° °Dental Care: °Organization         Address  Phone  Notes  °[place] [facility] [address], [place] (336) 641-6152 Accepts children up to age 21 who are enrolled in Medicaid or [place] Health Choice; pregnant women with a Medicaid card; and children who have applied for Medicaid or [place] Health Choice, but were declined, whose parents can pay a reduced fee at time of service.  °[place]  [facility] [place]  [address], [place] (336) 641-7733 Accepts children up to age 21 who are enrolled in Medicaid or [place] Health Choice; pregnant women with a Medicaid card; and children who have applied for Medicaid or [place] Health Choice, but were declined, whose parents can pay a reduced fee at time of service.  °[facility] ° [address],  (336) 641-4533 Patients are seen by appointment only. Walk-ins are not accepted. [facility] will see patients 18 years of age and older. °Monday - Tuesday (8am-5pm) °Most Wednesdays (8:30-5pm) °$30 per visit, cash only  °[facility] ° [address], [place] (336) 641-4533 Patients are seen by appointment only. Walk-ins are not accepted. [facility] will see patients 18 years of age and older. °One   Wednesday Evening (Monthly: Volunteer Based).  $30 per visit, cash only  °[facility]  (919) 537-3737 for adults; Children under age 4, call Graduate Pediatric Dentistry at (919) [phone]. Children aged 4-14, please call (919) [phone] to request a pediatric application. ° Dental services are provided in all areas of dental care including fillings, crowns and bridges, complete and partial dentures, implants, gum treatment, root canals, and extractions. Preventive care is also provided. Treatment is provided to both adults and children. °Patients are selected via a lottery and there is often a waiting list. °  °[facility] [address], °[place] ° (336) 763-8833 [email or web address] °  °[facility] [address], [place], [place] (336)723-1848, Ext. 123 Second and Fourth Thursday of each month, opens at 6:30 AM; Clinic ends at 9 AM.  Patients are seen on a first-come first-served basis, and a limited number are seen during each clinic.  ° °[facility] ° [address], [place], [place] (336) 723-7904    Eligibility Requirements °You must have lived in [place], [place], or [place] counties for at least the last three months. °  You cannot be eligible for state or federal sponsored healthcare insurance, including Veterans Administration, Medicaid, or Medicare. °  You generally cannot be eligible for healthcare insurance through your employer.  °  How to apply: °Eligibility screenings are held every Tuesday and Wednesday afternoon from 1:00 pm until 4:00 pm. You do not need an appointment for the interview!  °[facility] [address], [place], [place] 336-631-2330   °[facility]  336-342-8273   °[facility]  336-703-3100   °[facility]  336-570-6415   ° °Behavioral Health Resources in the Community: °Intensive Outpatient Programs °Organization         Address  Phone  Notes  °[facility] [address], [place], [place] 336-878-6098   °[facility] [address], [place], [place] 336-832-9800   °[facility] [address], [place], [place] ° 336-882-2125   °[facility] [address],  °[place], [place] 1-800-853-5163 or 336-641-4981   °Substance Abuse Resources °Organization         Address  Phone  Notes  °[facility]  336-882-2125   °[facility]  336-784-9470   °[facility]  336-285-9073   °[facility]  336-845-3988   °Residential & Outpatient Substance Abuse Program  1-800-659-3381   °Psychological Services °Organization         Address  Phone  Notes  °[facility]  336- 832-9600   °[facility]  336- 378-7881   °[facility] [address], [place] 1-800-853-5163 or 336-641-4981   ° °Mobile Crisis Teams °Organization         Address  Phone  Notes  °[facility], Mobile Crisis Care Unit  1-877-626-1772   °Assertive °[facility] ° [address]  [place], [place] 336-834-9664   °Sharon DeEsch [address] °[place] 336-554-5454   ° °Self-Help/Support Groups °Organization         Address  Phone             Notes  °[facility] [place] - variety of support groups  336- 373-1402 Call for more information  °Narcotics Anonymous (NA), [facility] [address], °[place]   2 meetings at this location  ° °  Residential Treatment Programs °Organization         Address  Phone  Notes  °[facility] [address],    °[place]  1-866-801-8205   °[facility] ° [address], [place], [place] 704-293-8524   °[facility] [address], [place] 336-845-3988 Admissions: 8am-3pm M-F  °[facility] [address]    °[place], [place] 336-841-1104   °[facility] [address], [place], [place] 336-379-7146   °[facility] [address]  °[place], [place] 336-285-9073   °[facility] - Intensive Outpatient [address], [place], [place] 336-852-3033   °[facility] [address]  °[place], [place] 1-877-615-2722 or 336-784-9470   °[facility] [address] [place], [place] 336-227-7417 Accepts Medicaid  °[facility] [address]  °[place] 1-800-659-3381 Substance Abuse/Addiction Treatment  ° °[place] Behavioral Health Resources °Organization         Address  Phone  Notes  °[facility]  (888) 581-9988   °Julie Brannon, PhD [address] [place], [place]   (336) 349-5553 or (336) 951-0000   °[facility]   [address] °[place], [place] (336) 349-4454   °[facility] [address], [place], [place] (336) 342-8316 Insurance/Medicaid/sponsorship through [facility]  °[facility] [address]                                    [place], [place] (336) 342-8316 Therapy/tele-psych/case    °[facility] [address]  ° [place],  (336) 349-2233    °Dr. Arfeen  (336) 349-4544   °[facility] [place]  [employer] [place] [facility] 1) [address], [place] °2) [address], [place] °3)  [address], [place] (336) 349-3220 °(336) 342-7768 ° °(336) 342-8140   °[place] [facility] (336) 342-1394 or (336) 342-3537 (After Hours)    ° ° °

## 2014-02-12 NOTE — ED Notes (Signed)
Pt arrived tot he ED via EMS with a complaint of an anxiety attack. Pt had a verbal altercation with her daughter which sparked a panic attack.  Pt has a history of anxiety but is not medicated for same.  Pt statess he has not had an anxiety attack for several years.  Pt has a visible right hand shake, is tearful with low speech.  Her son came with her

## 2014-02-12 NOTE — ED Provider Notes (Signed)
CSN: [id number]     Arrival date & time 02/12/14  0001 History   First MD Initiated Contact with Patient 02/12/14 0654     Chief Complaint  Patient presents with  . Anxiety     (Consider location/radiation/quality/duration/timing/severity/associated sxs/prior Treatment) HPI Comments: The pt is a 43 y/o female who reports no significant PMH other than occasional panic attacks - she had one last night when she argued with her daughter at 30 PM - it escalated and she reports shaking uncontrollably - she suffered with this most of the night and finally came here - she was given  1mg  of Ativan  And had complete resolution of her sx.  She is now very calm, denies hallucinations, suicidal thoughts though she endorses some mild deperssions lately.  She has no family doctor or psychiatrist.    Patient is a 43 y.o. female presenting with anxiety. The history is provided by the patient.  Anxiety    Past Medical History  Diagnosis Date  . 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 Chronic epigastric pain     recurrent discrete episodes   Past Surgical History  Procedure Laterality Date  . Tubal ligation    . Upper gastrointestinal endoscopy  11/25/2010    NORMAL  . Cholecystectomy  2012   Family History  Problem Relation Age of Onset  . Hypertension Father   . Heart attack Brother   . Colon polyps Brother   . Pancreatic cancer Maternal Aunt   . Diabetes Maternal Grandmother   . Colon cancer Neg Hx    History  Substance Use Topics  . Smoking status: Never Smoker   . Smokeless tobacco: Never Used  . Alcohol Use: No     Comment: social   OB History    Gravida Para Term Preterm AB TAB SAB Ectopic Multiple Living   3 2 2  1  1   2      Review of Systems  All other systems reviewed and are negative.     Allergies  Review of patient's allergies indicates no known allergies.  Home Medications   Prior to Admission medications   Medication Sig Start Date End Date Taking?  Authorizing Provider  ALPRAZolam Duanne Moron) 0.5 MG tablet Take 1 tablet (0.5 mg total) by mouth at bedtime as needed for anxiety. 02/12/14   Johnna Acosta, MD  azithromycin (ZITHROMAX) 250 MG tablet Take 1 tablet (250 mg total) by mouth daily. Take first 2 tablets together, then 1 every day until finished. 07/18/13   Marissa Sciacca, PA-C   BP 100/68 mmHg  Pulse 87  Temp(Src) 99.3 F (37.4 C) (Oral)  Resp 16  SpO2 100%  LMP 01/29/2014 Physical Exam  Constitutional: She appears well-developed and well-nourished. No distress.  HENT:  Head: Normocephalic and atraumatic.  Mouth/Throat: Oropharynx is clear and moist. No oropharyngeal exudate.  Eyes: Conjunctivae and EOM are normal. Pupils are equal, round, and reactive to light. Right eye exhibits no discharge. Left eye exhibits no discharge. No scleral icterus.  Neck: Normal range of motion. Neck supple. No JVD present. No thyromegaly present.  Cardiovascular: Normal rate, regular rhythm, normal heart sounds and intact distal pulses.  Exam reveals no gallop and no friction rub.   No murmur heard. Pulmonary/Chest: Effort normal and breath sounds normal. No respiratory distress. She has no wheezes. She has no rales.  Abdominal: Soft. Bowel sounds are normal. She exhibits no distension and no mass. There is no tenderness.  Musculoskeletal: Normal range  of motion. She exhibits no edema or tenderness.  Lymphadenopathy:    She has no cervical adenopathy.  Neurological: She is alert. Coordination normal.  Skin: Skin is warm and dry. No rash noted. No erythema.  Psychiatric:  After medication the patient has a normal affect, she appears bright, she is not depressed, not suicidal, is not hallucinating and appears calm.  Nursing note and vitals reviewed.   ED Course  Procedures (including critical care time) Labs Review Labs Reviewed - No data to display  Imaging Review No results found.    MDM   Final diagnoses:  Anxiety attack    The  patient's physical exam is now benign, she has had an anxiety attack and will need follow-up with family doctor and likely a therapist, she can be safely discharged with a small amount of Xanax as all she needs at this time is a small amount of rescue medicine. She states she has had less than 10 panic attacks in the last year, they're unpredictable but usually related to stress, we discussed behavioral modification and situational modification to which she is in agreement. Vital signs unremarkable, stable for discharge.  Filed Vitals:   02/12/14 0004 02/12/14 0651  BP: 113/71 100/68  Pulse: 118 87  Temp: 99.3 F (37.4 C)   TempSrc: Oral   Resp: 24 16  SpO2: 100% 100%    Meds given in ED:  Medications  LORazepam (ATIVAN) tablet 1 mg (1 mg Oral Given 02/12/14 0037)    New Prescriptions   ALPRAZOLAM (XANAX) 0.5 MG TABLET    Take 1 tablet (0.5 mg total) by mouth at bedtime as needed for anxiety.        Johnna Acosta, MD 02/12/14 [phone]

## 2014-06-02 ENCOUNTER — Emergency Department (HOSPITAL_COMMUNITY): Payer: 59

## 2014-06-02 ENCOUNTER — Encounter (HOSPITAL_COMMUNITY): Payer: Self-pay

## 2014-06-02 ENCOUNTER — Emergency Department (HOSPITAL_COMMUNITY)
Admission: EM | Admit: 2014-06-02 | Discharge: 2014-06-02 | Disposition: A | Discharge status: Home / Self Care | Payer: 59 | Attending: Emergency Medicine | Admitting: Emergency Medicine

## 2014-06-02 DIAGNOSIS — R079 Chest pain, unspecified: Secondary | ICD-10-CM | POA: Insufficient documentation

## 2014-06-02 DIAGNOSIS — F410 Panic disorder [episodic paroxysmal anxiety] without agoraphobia: Secondary | ICD-10-CM | POA: Insufficient documentation

## 2014-06-02 DIAGNOSIS — G8929 Other chronic pain: Secondary | ICD-10-CM | POA: Insufficient documentation

## 2014-06-02 DIAGNOSIS — R0789 Other chest pain: Secondary | ICD-10-CM

## 2014-06-02 DIAGNOSIS — Z79899 Other long term (current) drug therapy: Secondary | ICD-10-CM | POA: Insufficient documentation

## 2014-06-02 LAB — I-STAT TROPONIN, ED: Troponin i, poc: 0 ng/mL (ref 0.00–0.08)

## 2014-06-02 LAB — BASIC METABOLIC PANEL
Anion gap: 4 — ABNORMAL LOW (ref 5–15)
BUN: 8 mg/dL (ref 6–23)
CO2: 22 mmol/L (ref 19–32)
Calcium: 9.3 mg/dL (ref 8.4–10.5)
Chloride: 110 mmol/L (ref 96–112)
Creatinine, Ser: 0.9 mg/dL (ref 0.50–1.10)
GFR calc Af Amer: >90 mL/min (ref >90)
GFR calc non Af Amer: 78 mL/min — ABNORMAL LOW (ref >90)
Glucose, Bld: 104 mg/dL — ABNORMAL HIGH (ref 70–99)
Potassium: 3.9 mmol/L (ref 3.5–5.1)
SODIUM: 136 mmol/L (ref 135–145)

## 2014-06-02 LAB — CBC
HEMATOCRIT: 33.2 % — AB (ref 36.0–46.0)
HEMOGLOBIN: 10.1 g/dL — AB (ref 12.0–15.0)
MCH: 22 pg — ABNORMAL LOW (ref 26.0–34.0)
MCHC: 30.4 g/dL (ref 30.0–36.0)
MCV: 72.3 fL — AB (ref 78.0–100.0)
Platelets: 289 10*3/uL (ref 150–400)
RBC: 4.59 MIL/uL (ref 3.87–5.11)
RDW: 19.9 % — AB (ref 11.5–15.5)
WBC: 10 10*3/uL (ref 4.0–10.5)

## 2014-06-02 NOTE — Discharge Instructions (Signed)

## 2014-06-02 NOTE — ED Provider Notes (Signed)
CSN: [id number]     Arrival date & time 06/02/14  1917 History   First MD Initiated Contact with Patient 06/02/14 2200     Chief Complaint  Patient presents with  . Hypertension  . Anxiety     (Consider location/radiation/quality/duration/timing/severity/associated sxs/prior Treatment) HPI Victoria Bender is a 44 y.o. female who comes in for evaluation of anxiety and chest discomfort. Patient states at approximately 5:00 PM this afternoon she was in an argument with her 65 year old daughter when she began to experience some "chest tightness". She localizes the discomfort to the center of her chest. She reports it lasted for approximately 30 minutes and resolved spontaneously. She did not take any medications to improve her symptoms. She reports feeling an associated pain at the in of her left arm. She reports checking her blood pressure at home and noted it was in the "130s". She reports she has not been told by a physician that she has high blood pressure. She denies any chest pain now. Denies headaches, shortness of breath, cough, nausea or vomiting, abdominal pain, dizziness, syncope, rash, urinary symptoms, diarrhea or constipation, vision changes. Denies cocaine or other illicit substance use.  Past Medical History  Diagnosis Date  . 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 Chronic epigastric pain     recurrent discrete episodes   Past Surgical History  Procedure Laterality Date  . Tubal ligation    . Upper gastrointestinal endoscopy  11/25/2010    NORMAL  . Cholecystectomy  2012   Family History  Problem Relation Age of Onset  . Hypertension Father   . Heart attack Brother   . Colon polyps Brother   . Pancreatic cancer Maternal Aunt   . Diabetes Maternal Grandmother   . Colon cancer Neg Hx    History  Substance Use Topics  . Smoking status: Never Smoker   . Smokeless tobacco: Never Used  . Alcohol Use: No     Comment: social   OB History    Gravida Para Term Preterm AB  TAB SAB Ectopic Multiple Living   3 2 2  1  1   2      Review of Systems  A 10 point review of systems was completed and was negative except for pertinent positives and negatives as mentioned in the history of present illness    Allergies  Review of patient's allergies indicates no known allergies.  Home Medications   Prior to Admission medications   Medication Sig Start Date End Date Taking? Authorizing Provider  ALPRAZolam Duanne Moron) 0.5 MG tablet Take 1 tablet (0.5 mg total) by mouth at bedtime as needed for anxiety. 02/12/14  Yes Johnna Acosta, MD  azithromycin (ZITHROMAX) 250 MG tablet Take 1 tablet (250 mg total) by mouth daily. Take first 2 tablets together, then 1 every day until finished. Patient not taking: Reported on 06/02/2014 07/18/13   Marissa Sciacca, PA-C   BP 117/77 mmHg  Pulse 87  Temp(Src) 98 F (36.7 C) (Oral)  Resp 18  SpO2 100%  LMP 05/12/2014 (Approximate) Physical Exam  Constitutional: She is oriented to person, place, and time. She appears well-developed and well-nourished.  HENT:  Head: Normocephalic and atraumatic.  Mouth/Throat: Oropharynx is clear and moist.  Eyes: Conjunctivae are normal. Pupils are equal, round, and reactive to light. Right eye exhibits no discharge. Left eye exhibits no discharge. No scleral icterus.  Neck: Neck supple.  Cardiovascular: Normal rate, regular rhythm and normal heart sounds.   Pulmonary/Chest: Effort normal  and breath sounds normal. No respiratory distress. She has no wheezes. She has no rales.  Abdominal: Soft. There is no tenderness.  Musculoskeletal: She exhibits no tenderness.  Neurological: She is alert and oriented to person, place, and time.  Cranial Nerves II-XII grossly intact  Skin: Skin is warm and dry. No rash noted.  Psychiatric: She has a normal mood and affect.  Nursing note and vitals reviewed.   ED Course  Procedures (including critical care time) Labs Review Labs Reviewed  BASIC METABOLIC  PANEL - Abnormal; Notable for the following:    Glucose, Bld 104 (*)    GFR calc non Af Amer 78 (*)    Anion gap 4 (*)    All other components within normal limits  CBC - Abnormal; Notable for the following:    Hemoglobin 10.1 (*)    HCT 33.2 (*)    MCV 72.3 (*)    MCH 22.0 (*)    RDW 19.9 (*)    All other components within normal limits  Randolm Idol, ED    Imaging Review Dg Chest Port 1 View  06/02/2014   CLINICAL DATA:  Acute onset of generalized chest pain. Initial encounter.  EXAM: PORTABLE CHEST - 1 VIEW  COMPARISON:  Chest radiograph performed 07/18/2013  FINDINGS: The lungs are well-aerated. Mild vascular congestion is noted. There is no evidence of focal opacification, pleural effusion or pneumothorax.  The cardiomediastinal silhouette is within normal limits. No acute osseous abnormalities are seen.  IMPRESSION: Mild vascular congestion noted; lungs remain grossly clear.   Electronically Signed   By: Garald Balding M.D.   On: 06/02/2014 23:13     EKG Interpretation None      Date: 06/03/2014  Rate: 104  Rhythm: sinus tachycardia  QRS Axis: normal  Intervals: normal  ST/T Wave abnormalities: normal  Conduction Disutrbances:none  Narrative Interpretation:   Old EKG Reviewed: unchanged   Meds given in ED:  Medications - No data to display  Discharge Medication List as of 06/02/2014 11:30 PM     Filed Vitals:   06/02/14 1931 06/02/14 2233  BP: 143/78 117/77  Pulse: 99 87  Temp: 99.1 F (37.3 C) 98 F (36.7 C)  TempSrc: Oral Oral  Resp: 20 18  SpO2: 100% 100%    MDM  Vitals stable - WNL -afebrile Pt resting comfortably in ED. denies any discomfort at this time. PE unremarkable Labwork noncontributory, troponin negative, EKG unchanged from previous Imaging-chest x-ray negative for pneumothorax or other emergent pathology. There is mild vascular congestion without any focal opacities  DDX--heart score 2-low concern for ACS, PERC negative-doubt PE, low  concern for dissection or esophageal rupture, pneumothorax  I discussed all relevant lab findings and imaging results with pt and they verbalized understanding. Discussed f/u with PCP within 48 hrs and return precautions, pt very amenable to plan.  Final diagnoses:  Chest discomfort       Viona Gilmore [place], PA-C 06/03/14 1035  Ernestina Patches, MD 06/03/14 2106

## 2014-06-02 NOTE — ED Notes (Signed)
Pt ambulatory to exam room with steady gait.  

## 2014-06-02 NOTE — ED Notes (Signed)
Pt presents with c/o hypertension and anxiety. Pt has visible tremors and reports that this happens whenever she is experiencing symptoms of anxiety. Pt reports she checked her BP at home and it was somewhat elevated 130's. Pt also reporting some chest tightness and left arm pain when her symptoms started, this has now resolved. EKG ordered in triage.

## 2014-10-25 ENCOUNTER — Emergency Department (HOSPITAL_COMMUNITY): Payer: 59

## 2014-10-25 ENCOUNTER — Emergency Department (HOSPITAL_COMMUNITY): Payer: Self-pay

## 2014-10-25 ENCOUNTER — Emergency Department (HOSPITAL_COMMUNITY)
Admission: EM | Admit: 2014-10-25 | Discharge: 2014-10-26 | Disposition: A | Discharge status: Home / Self Care | Payer: Self-pay | Attending: Emergency Medicine | Admitting: Emergency Medicine

## 2014-10-25 ENCOUNTER — Encounter (HOSPITAL_COMMUNITY): Payer: Self-pay | Admitting: *Deleted

## 2014-10-25 DIAGNOSIS — R42 Dizziness and giddiness: Secondary | ICD-10-CM | POA: Insufficient documentation

## 2014-10-25 DIAGNOSIS — G8929 Other chronic pain: Secondary | ICD-10-CM | POA: Insufficient documentation

## 2014-10-25 DIAGNOSIS — R079 Chest pain, unspecified: Secondary | ICD-10-CM | POA: Insufficient documentation

## 2014-10-25 DIAGNOSIS — R0602 Shortness of breath: Secondary | ICD-10-CM | POA: Insufficient documentation

## 2014-10-25 LAB — BASIC METABOLIC PANEL
ANION GAP: 7 (ref 5–15)
BUN: 8 mg/dL (ref 6–20)
CHLORIDE: 107 mmol/L (ref 101–111)
CO2: 23 mmol/L (ref 22–32)
Calcium: 9.2 mg/dL (ref 8.9–10.3)
Creatinine, Ser: 0.88 mg/dL (ref 0.44–1.00)
GFR calc non Af Amer: >60 mL/min (ref >60)
Glucose, Bld: 105 mg/dL — ABNORMAL HIGH (ref 65–99)
Potassium: 3.5 mmol/L (ref 3.5–5.1)
Sodium: 137 mmol/L (ref 135–145)

## 2014-10-25 LAB — I-STAT TROPONIN, ED: Troponin i, poc: 0 ng/mL (ref 0.00–0.08)

## 2014-10-25 LAB — CBC
HEMATOCRIT: 32.3 % — AB (ref 36.0–46.0)
HEMOGLOBIN: 9.8 g/dL — AB (ref 12.0–15.0)
MCH: 21.6 pg — ABNORMAL LOW (ref 26.0–34.0)
MCHC: 30.3 g/dL (ref 30.0–36.0)
MCV: 71.1 fL — ABNORMAL LOW (ref 78.0–100.0)
Platelets: 301 10*3/uL (ref 150–400)
RBC: 4.54 MIL/uL (ref 3.87–5.11)
RDW: 16.5 % — AB (ref 11.5–15.5)
WBC: 9 10*3/uL (ref 4.0–10.5)

## 2014-10-25 LAB — D-DIMER, QUANTITATIVE: D-Dimer, Quant: 0.71 ug/mL-FEU — ABNORMAL HIGH (ref 0.00–0.48)

## 2014-10-25 MED ORDER — SODIUM CHLORIDE 0.9 % IV BOLUS (SEPSIS)
1000.0000 mL | Freq: Once | INTRAVENOUS | Status: AC
  Administered 2014-10-25: 1000 mL via INTRAVENOUS

## 2014-10-25 MED ORDER — LORAZEPAM 2 MG/ML IJ SOLN
1.0000 mg | Freq: Once | INTRAMUSCULAR | Status: AC
  Administered 2014-10-25: 1 mg via INTRAVENOUS
  Filled 2014-10-25: qty 1

## 2014-10-25 MED ORDER — IOHEXOL 350 MG/ML SOLN
100.0000 mL | Freq: Once | INTRAVENOUS | Status: AC | PRN
  Administered 2014-10-25: 100 mL via INTRAVENOUS

## 2014-10-25 NOTE — ED Notes (Signed)
Pt states that she began having chest pain around 6pm tonight; pt states that the pain was sharp and she felt short of breath; pt states that the pain is to under left and radiates to under left arm and to left arm

## 2014-10-25 NOTE — ED Provider Notes (Signed)
CSN: [id number]     Arrival date & time 10/25/14  2158 History   First MD Initiated Contact with Patient 10/25/14 2209     Chief Complaint  Patient presents with  . Chest Pain     (Consider location/radiation/quality/duration/timing/severity/associated sxs/prior Treatment) HPI  44 year old female presents after having chest pain for around 10 minutes. Around 6 PM tonight she was in the mall with her mother shopping when she developed a gradually worsening sharp chest pain under her left breast. Patient states some of the pain radiated to her left arm. There is no diaphoresis, nausea, vomiting, but she did have a little bit of lightheadedness and felt like it is hard to catch her breath. Patient also felt palpitations. She was sitting down when this started. She tried to walk around the felt a little lightheaded. Did not pass out. Denies any history of leg swelling, leg pain, DVT, recent surgery or travel, hemoptysis, or estrogen use. No prior history of hypertension, hyperlipidemia, diabetes, smoking. Her cousin recently died of a heart attack at age 64. Has been pain-free for several hours. Since this started she has been having right arm shaking. Has had this before with anxiety attacks but does not feel this was all an anxiety attack.   Past Medical History  Diagnosis Date  . 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 Chronic epigastric pain     recurrent discrete episodes   Past Surgical History  Procedure Laterality Date  . Tubal ligation    . Upper gastrointestinal endoscopy  11/25/2010    NORMAL  . Cholecystectomy  2012   Family History  Problem Relation Age of Onset  . Hypertension Father   . Heart attack Brother   . Colon polyps Brother   . Pancreatic cancer Maternal Aunt   . Diabetes Maternal Grandmother   . Colon cancer Neg Hx    History  Substance Use Topics  . Smoking status: Never Smoker   . Smokeless tobacco: Never Used  . Alcohol Use: No     Comment: social   OB  History    Gravida Para Term Preterm AB TAB SAB Ectopic Multiple Living   3 2 2  1  1   2      Review of Systems  Constitutional: Negative for fever, diaphoresis and fatigue.  Respiratory: Positive for shortness of breath. Negative for cough.   Cardiovascular: Positive for chest pain.  Gastrointestinal: Negative for nausea, vomiting and abdominal pain.  Musculoskeletal: Negative for back pain.  Neurological: Positive for dizziness.  All other systems reviewed and are negative.     Allergies  Review of patient's allergies indicates no known allergies.  Home Medications   Prior to Admission medications   Medication Sig Start Date End Date Taking? Authorizing Provider  ALPRAZolam Duanne Moron) 0.5 MG tablet Take 1 tablet (0.5 mg total) by mouth at bedtime as needed for anxiety. 02/12/14   Noemi Chapel, MD  azithromycin (ZITHROMAX) 250 MG tablet Take 1 tablet (250 mg total) by mouth daily. Take first 2 tablets together, then 1 every day until finished. Patient not taking: Reported on 06/02/2014 07/18/13   Marissa Sciacca, PA-C   BP 117/74 mmHg  Pulse 87  Temp(Src) 98.5 F (36.9 C) (Oral)  Resp 18  SpO2 100%  LMP 10/19/2014 Physical Exam  Constitutional: She is oriented to person, place, and time. She appears well-developed and well-nourished. No distress.  HENT:  Head: Normocephalic and atraumatic.  Right Ear: External ear normal.  Left Ear:  External ear normal.  Nose: Nose normal.  Eyes: Right eye exhibits no discharge. Left eye exhibits no discharge.  Cardiovascular: Normal rate, regular rhythm and normal heart sounds.   Pulmonary/Chest: Effort normal and breath sounds normal. She has no wheezes. She has no rales. She exhibits no tenderness.  Abdominal: Soft. She exhibits no distension. There is no tenderness.  Musculoskeletal: She exhibits no edema or tenderness.  Neurological: She is alert and oriented to person, place, and time.  5/5 strength in all 4 extremities. Consistent  tremor in RUE.   Skin: Skin is warm and dry. She is not diaphoretic.  Nursing note and vitals reviewed.   ED Course  Procedures (including critical care time) Labs Review Labs Reviewed  BASIC METABOLIC PANEL - Abnormal; Notable for the following:    Glucose, Bld 105 (*)    All other components within normal limits  CBC - Abnormal; Notable for the following:    Hemoglobin 9.8 (*)    HCT 32.3 (*)    MCV 71.1 (*)    MCH 21.6 (*)    RDW 16.5 (*)    All other components within normal limits  D-DIMER, QUANTITATIVE (NOT AT [facility]) - Abnormal; Notable for the following:    D-Dimer, Quant 0.71 (*)    All other components within normal limits  I-STAT TROPOININ, ED    Imaging Review Dg Chest 2 View  10/25/2014   CLINICAL DATA:  Chest pain for 4 hours. Sharp left-sided pain with shortness of breath.  EXAM: CHEST  2 VIEW  COMPARISON:  06/02/2014  FINDINGS: Mild elevation of left hemidiaphragm, unchanged. The cardiomediastinal contours are normal. The lungs are clear. Pulmonary vasculature is normal. No consolidation, pleural effusion, or pneumothorax. No acute osseous abnormalities are seen.  IMPRESSION: No acute pulmonary process.   Electronically Signed   By: Jeb Levering M.D.   On: 10/25/2014 22:59     EKG Interpretation   Date/Time:  Wednesday October 25 2014 22:05:29 EDT Ventricular Rate:  96 PR Interval:  143 QRS Duration: 67 QT Interval:  324 QTC Calculation: 409 R Axis:   42 Text Interpretation:  Sinus rhythm Abnormal R-wave progression, early  transition Borderline T abnormalities, anterior leads Confirmed by  Patricio Popwell  MD, Traven Davids ([id number]) on 10/25/2014 10:10:06 PM      MDM   Final diagnoses:  Chest pain, unspecified chest pain type    Patient with atypical transient sharp left-sided chest pain. Patient has no risk factors for coronary disease and her HEART score is 1. This puts her in a local risk category. Given that she is not currently having pain I feel that a second  troponin and if negative would clear her and she can follow up with her PCP for outpatient chest pain workup. She was transiently tachycardic during my evaluation and thus a d-dimer was sent for low-risk PE. It is positive and thus a CT scan will be obtained to rule out PE. Her transient tremor was relieved with ativan. Likely anxiety related as this has occurred to her in the past. Care transferred to Dr. Claudine Mouton with CT and 2nd troponin pending.    Sherwood Gambler, MD 10/25/14 [phone]

## 2014-10-26 LAB — I-STAT TROPONIN, ED: Troponin i, poc: 0 ng/mL (ref 0.00–0.08)

## 2014-10-26 NOTE — Discharge Instructions (Signed)
Chest Pain (Nonspecific) Victoria Bender, your blood work and CT scan today were normal. See a primary care physician within 3 days for close follow-up. If symptoms worsen come back to emergency department immediately. Thank you. It is often hard to give a diagnosis for the cause of chest pain. There is always a chance that your pain could be related to something serious, such as a heart attack or a blood clot in the lungs. You need to follow up with your doctor. HOME CARE  If antibiotic medicine was given, take it as directed by your doctor. Finish the medicine even if you start to feel better.  For the next few days, avoid activities that bring on chest pain. Continue physical activities as told by your doctor.  Do not use any tobacco products. This includes cigarettes, chewing tobacco, and e-cigarettes.  Avoid drinking alcohol.  Only take medicine as told by your doctor.  Follow your doctor's suggestions for more testing if your chest pain does not go away.  Keep all doctor visits you made. GET HELP IF:  Your chest pain does not go away, even after treatment.  You have a rash with blisters on your chest.  You have a fever. GET HELP RIGHT AWAY IF:   You have more pain or pain that spreads to your arm, neck, jaw, back, or belly (abdomen).  You have shortness of breath.  You cough more than usual or cough up blood.  You have very bad back or belly pain.  You feel sick to your stomach (nauseous) or throw up (vomit).  You have very bad weakness.  You pass out (faint).  You have chills. This is an emergency. Do not wait to see if the problems will go away. Call your local emergency services (911 in [place] Do not drive yourself to the hospital. MAKE SURE YOU:   Understand these instructions.  Will watch your condition.  Will get help right away if you are not doing well or get worse. Document Released: 09/03/2007 Document Revised: 03/22/2013 Document Reviewed:  09/03/2007 [facility] Patient Information 2015 [place], [place]. This information is not intended to replace advice given to you by your health care provider. Make sure you discuss any questions you have with your health care provider.

## 2014-10-26 NOTE — ED Provider Notes (Signed)
I was signed out patient with HEART score of 1 as pending repeat troponin at 1:30am and CTA for PE.  CTA is negative for blood clot.  Repeat troponin is pending.  01:52am  second troponin is negative.  She will be discharged with primary care follow-up within 3 days.  Everlene Balls, MD 10/26/14 [phone]

## 2015-08-19 ENCOUNTER — Emergency Department (HOSPITAL_COMMUNITY): Admission: EM | Admit: 2015-08-19 | Discharge: 2015-08-19 | Discharge status: Absent without leave | Payer: Self-pay

## 2016-08-26 ENCOUNTER — Emergency Department (HOSPITAL_COMMUNITY)
Admission: EM | Admit: 2016-08-26 | Discharge: 2016-08-26 | Disposition: A | Discharge status: Home / Self Care | Payer: No Typology Code available for payment source | Attending: Emergency Medicine | Admitting: Emergency Medicine

## 2016-08-26 ENCOUNTER — Encounter (HOSPITAL_COMMUNITY): Payer: Self-pay | Admitting: Emergency Medicine

## 2016-08-26 ENCOUNTER — Emergency Department (HOSPITAL_COMMUNITY): Payer: No Typology Code available for payment source

## 2016-08-26 DIAGNOSIS — Y92410 Unspecified street and highway as the place of occurrence of the external cause: Secondary | ICD-10-CM | POA: Diagnosis not present

## 2016-08-26 DIAGNOSIS — Y939 Activity, unspecified: Secondary | ICD-10-CM | POA: Diagnosis not present

## 2016-08-26 DIAGNOSIS — S39012A Strain of muscle, fascia and tendon of lower back, initial encounter: Secondary | ICD-10-CM | POA: Diagnosis not present

## 2016-08-26 DIAGNOSIS — S161XXA Strain of muscle, fascia and tendon at neck level, initial encounter: Secondary | ICD-10-CM | POA: Diagnosis not present

## 2016-08-26 DIAGNOSIS — R0789 Other chest pain: Secondary | ICD-10-CM | POA: Diagnosis not present

## 2016-08-26 DIAGNOSIS — Z79899 Other long term (current) drug therapy: Secondary | ICD-10-CM | POA: Diagnosis not present

## 2016-08-26 DIAGNOSIS — Y999 Unspecified external cause status: Secondary | ICD-10-CM | POA: Insufficient documentation

## 2016-08-26 DIAGNOSIS — S199XXA Unspecified injury of neck, initial encounter: Secondary | ICD-10-CM | POA: Diagnosis present

## 2016-08-26 MED ORDER — METHOCARBAMOL 500 MG PO TABS: 500.0000 mg | ORAL_TABLET | Freq: Every evening | ORAL | 0 refills | Status: DC | PRN

## 2016-08-26 MED ORDER — HYDROCODONE-ACETAMINOPHEN 5-325 MG PO TABS: 1.0000 | ORAL_TABLET | Freq: Four times a day (QID) | ORAL | 0 refills | Status: DC | PRN

## 2016-08-26 MED ORDER — METHOCARBAMOL 500 MG PO TABS
1000.0000 mg | ORAL_TABLET | Freq: Once | ORAL | Status: AC
  Administered 2016-08-26: 1000 mg via ORAL
  Filled 2016-08-26: qty 2

## 2016-08-26 MED ORDER — IBUPROFEN 600 MG PO TABS: 600.0000 mg | ORAL_TABLET | Freq: Four times a day (QID) | ORAL | 0 refills | Status: DC | PRN

## 2016-08-26 MED ORDER — IBUPROFEN 200 MG PO TABS
600.0000 mg | ORAL_TABLET | Freq: Once | ORAL | Status: AC
  Administered 2016-08-26: 600 mg via ORAL
  Filled 2016-08-26: qty 3
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
## 2016-08-26 NOTE — Discharge Instructions (Addendum)
Take Ibuprofen three times daily for the next week. Take this medicine with food. Take muscle relaxer at bedtime to help you sleep. This medicine makes you drowsy so do not take before driving or work Take pain medicine as needed Use a heating pad for sore muscles - use for 20 minutes several times a day Return for worsening symptoms
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
## 2016-10-29 ENCOUNTER — Encounter (HOSPITAL_COMMUNITY): Payer: Self-pay | Admitting: Emergency Medicine

## 2016-10-29 ENCOUNTER — Emergency Department (HOSPITAL_COMMUNITY)
Admission: EM | Admit: 2016-10-29 | Discharge: 2016-10-29 | Disposition: A | Discharge status: Home / Self Care | Payer: Self-pay | Attending: Emergency Medicine | Admitting: Emergency Medicine

## 2016-10-29 DIAGNOSIS — Y929 Unspecified place or not applicable: Secondary | ICD-10-CM | POA: Insufficient documentation

## 2016-10-29 DIAGNOSIS — Y939 Activity, unspecified: Secondary | ICD-10-CM | POA: Insufficient documentation

## 2016-10-29 DIAGNOSIS — T161XXA Foreign body in right ear, initial encounter: Secondary | ICD-10-CM | POA: Insufficient documentation

## 2016-10-29 DIAGNOSIS — Y999 Unspecified external cause status: Secondary | ICD-10-CM | POA: Insufficient documentation

## 2016-10-29 DIAGNOSIS — X58XXXA Exposure to other specified factors, initial encounter: Secondary | ICD-10-CM | POA: Insufficient documentation

## 2016-10-29 NOTE — ED Notes (Signed)
Discharge instructions reviewed with patient. Patient verbalizes understanding. VSS.   

## 2016-10-29 NOTE — Discharge Instructions (Signed)
It was my pleasure taking care of you today!  ° °Return to ER for new or worsening symptoms, any additional concerns.  °

## 2016-10-29 NOTE — ED Triage Notes (Signed)
Pt c/o painless foreign body sensation to ear, aggravated by swallowing. Pt woke this morning with cotton-tipped applicator, "Q-tip," in her ear, does not remember putting Q-tip in her ear. Does regularly clean her ear canals with q-tips.  Cotton-like strands of foreign material present in ear canal. TM obstructed by solid white opaque material.

## 2016-10-29 NOTE — ED Provider Notes (Signed)
[facility] DEPT Provider Note   CSN: [id number] Arrival date & time: 10/29/16  1910     History   Chief Complaint Chief Complaint  Patient presents with  . Ear Fullness    HPI Victoria Bender is a 46 y.o. female.  The history is provided by the patient and medical records. No language interpreter was used.  Ear Fullness    Victoria Bender is a 46 y.o. female with no major medical problems who presents to ER for foreign body sensation to the right ear. Patient states that she awoke this morning with a Q-tip in her ear. She states that she must have tried to clean her ears in her sleep as she does not remember putting Q-tips in the ear. She does clean her ears regularly this way. She notes that since this morning, she has had decreased hearing in full sensation to the ear. Left ear feels as usual. No ear pain. No fever, chills, congestion.  Past Medical History:  Diagnosis Date  . Chronic epigastric pain    recurrent discrete episodes  . 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 Abdominal pain, epigastric 10/14/2010  . Nausea with vomiting 10/14/2010    Past Surgical History:  Procedure Laterality Date  . CHOLECYSTECTOMY  2012  . TUBAL LIGATION    . UPPER GASTROINTESTINAL ENDOSCOPY  11/25/2010   NORMAL    OB History    Gravida Para Term Preterm AB Living   3 2 2   1 2    SAB TAB Ectopic Multiple Live Births   1               Home Medications    Prior to Admission medications   Medication Sig Start Date End Date Taking? Authorizing Provider  HYDROcodone-acetaminophen (NORCO/VICODIN) 5-325 MG tablet Take 1 tablet by mouth every 6 (six) hours as needed for severe pain. 08/26/16   Recardo Evangelist, PA-C  ibuprofen (ADVIL,MOTRIN) 600 MG tablet Take 1 tablet (600 mg total) by mouth every 6 (six) hours as needed. 08/26/16   Recardo Evangelist, PA-C  methocarbamol (ROBAXIN) 500 MG tablet Take 1 tablet (500 mg  total) by mouth at bedtime and may repeat dose one time if needed. 08/26/16   Recardo Evangelist, PA-C    Family History Family History  Problem Relation Age of Onset  . Hypertension Father   . Heart attack Brother   . Colon polyps Brother   . Pancreatic cancer Maternal Aunt   . Diabetes Maternal Grandmother   . Colon cancer Neg Hx     Social History Social History  Substance Use Topics  . Smoking status: Never Smoker  . Smokeless tobacco: Never Used  . Alcohol use No     Comment: social     Allergies   Patient has no known allergies.   Review of Systems Review of Systems  HENT: Negative for ear discharge and ear pain.        + ear fullness     Physical Exam Updated Vital Signs BP 110/70 (BP Location: Left Arm)   Pulse 91   Temp 99 F (37.2 C) (Oral)   Resp 18   SpO2 100%   Physical Exam  Constitutional: She appears well-developed and well-nourished. No distress.  HENT:  Head: Normocephalic and atraumatic.  Left TM normal. Right ear canal occluded with white material c/w cotton.  Neck: Neck supple.  Cardiovascular:  Normal rate, regular rhythm and normal heart sounds.   No murmur heard. Pulmonary/Chest: Effort normal and breath sounds normal. No respiratory distress. She has no wheezes. She has no rales.  Musculoskeletal: Normal range of motion.  Neurological: She is alert.  Skin: Skin is warm and dry.  Nursing note and vitals reviewed.    ED Treatments / Results  Labs (all labs ordered are listed, but only abnormal results are displayed) Labs Reviewed - No data to display  EKG  EKG Interpretation None       Radiology No results found.  Procedures .Foreign Body Removal Date/Time: 10/29/2016 8:05 PM Performed by: Seward Speck Authorized by: Seward Speck  Consent: Verbal consent obtained. Risks and benefits: risks, benefits and alternatives were discussed Consent given by: patient Patient understanding: patient states  understanding of the procedure being performed Patient identity confirmed: verbally with patient Body area: ear Location details: right ear Localization method: ENT speculum Removal mechanism: forceps Complexity: simple Objects recovered: Cotton Post-procedure assessment: foreign body removed Patient tolerance: Patient tolerated the procedure well with no immediate complications   (including critical care time)  Medications Ordered in ED Medications - No data to display   Initial Impression / Assessment and Plan / ED Course  I have reviewed the triage vital signs and the nursing notes.  Pertinent labs & imaging results that were available during my care of the patient were reviewed by me and considered in my medical decision making (see chart for details).    Victoria Bender is a 46 y.o. female who presents to ED for decreased hearing and full sensation to the right ear. She awoke this morning with a q-tip in her ear and believes she attempted to clean the ears in her sleep. On exam, cotton material occluding TM. Removed with forceps as dictated above. Upon removal, TM examined with no signs of perf or infection. Patient feels improved. Symptoms resolved. Discharged home in satisfactory condition. All questions answered.    Final Clinical Impressions(s) / ED Diagnoses   Final diagnoses:  Foreign body of right ear, initial encounter    New Prescriptions New Prescriptions   No medications on file     Leverne Tessler, Ozella Almond, Hershal Coria 10/29/16 2008    Isla Pence, MD 10/29/16 2053

## 2017-03-15 ENCOUNTER — Encounter (HOSPITAL_COMMUNITY): Payer: Self-pay | Admitting: *Deleted

## 2017-03-15 ENCOUNTER — Other Ambulatory Visit: Payer: Self-pay

## 2017-03-15 ENCOUNTER — Emergency Department (HOSPITAL_COMMUNITY)
Admission: EM | Admit: 2017-03-15 | Discharge: 2017-03-15 | Disposition: A | Discharge status: Home / Self Care | Payer: Self-pay | Attending: Emergency Medicine | Admitting: Emergency Medicine

## 2017-03-15 ENCOUNTER — Emergency Department (HOSPITAL_COMMUNITY): Payer: Self-pay

## 2017-03-15 DIAGNOSIS — R079 Chest pain, unspecified: Secondary | ICD-10-CM | POA: Insufficient documentation

## 2017-03-15 DIAGNOSIS — Z5321 Procedure and treatment not carried out due to patient leaving prior to being seen by health care provider: Secondary | ICD-10-CM | POA: Insufficient documentation

## 2017-03-15 LAB — CBC
HCT: 29.2 % — ABNORMAL LOW (ref 36.0–46.0)
HEMOGLOBIN: 8.6 g/dL — AB (ref 12.0–15.0)
MCH: 19.9 pg — ABNORMAL LOW (ref 26.0–34.0)
MCHC: 29.5 g/dL — ABNORMAL LOW (ref 30.0–36.0)
MCV: 67.4 fL — ABNORMAL LOW (ref 78.0–100.0)
PLATELETS: 290 10*3/uL (ref 150–400)
RBC: 4.33 MIL/uL (ref 3.87–5.11)
RDW: 18.2 % — ABNORMAL HIGH (ref 11.5–15.5)
WBC: 8.4 10*3/uL (ref 4.0–10.5)

## 2017-03-15 LAB — BASIC METABOLIC PANEL
ANION GAP: 7 (ref 5–15)
BUN: 7 mg/dL (ref 6–20)
CALCIUM: 9.2 mg/dL (ref 8.9–10.3)
CHLORIDE: 109 mmol/L (ref 101–111)
CO2: 21 mmol/L — ABNORMAL LOW (ref 22–32)
CREATININE: 0.83 mg/dL (ref 0.44–1.00)
GFR calc non Af Amer: >60 mL/min (ref >60)
Glucose, Bld: 103 mg/dL — ABNORMAL HIGH (ref 65–99)
Potassium: 3.6 mmol/L (ref 3.5–5.1)
SODIUM: 137 mmol/L (ref 135–145)

## 2017-03-15 LAB — I-STAT TROPONIN, ED: TROPONIN I, POC: 0 ng/mL (ref 0.00–0.08)

## 2017-03-15 LAB — I-STAT BETA HCG BLOOD, ED (MC, WL, AP ONLY): I-stat hCG, quantitative: <5 m[IU]/mL (ref <5)

## 2017-03-15 NOTE — ED Notes (Signed)
Called to room without response

## 2017-03-15 NOTE — ED Triage Notes (Signed)
Pt reports chest tightness for several days. Had sharp pains under left breast today and sob. Reports baseline leg swelling but more severe today.

## 2017-03-15 NOTE — ED Notes (Signed)
No answer x2 for a room 

## 2017-11-30 ENCOUNTER — Other Ambulatory Visit: Payer: Self-pay

## 2017-11-30 ENCOUNTER — Emergency Department (HOSPITAL_COMMUNITY)
Admission: EM | Admit: 2017-11-30 | Discharge: 2017-11-30 | Disposition: A | Discharge status: Home / Self Care | Payer: Medicaid Other | Attending: Emergency Medicine | Admitting: Emergency Medicine

## 2017-11-30 ENCOUNTER — Emergency Department (HOSPITAL_COMMUNITY): Payer: Medicaid Other

## 2017-11-30 ENCOUNTER — Encounter (HOSPITAL_COMMUNITY): Payer: Self-pay

## 2017-11-30 DIAGNOSIS — F410 Panic disorder [episodic paroxysmal anxiety] without agoraphobia: Secondary | ICD-10-CM | POA: Insufficient documentation

## 2017-11-30 DIAGNOSIS — R079 Chest pain, unspecified: Secondary | ICD-10-CM | POA: Insufficient documentation

## 2017-11-30 LAB — BASIC METABOLIC PANEL
ANION GAP: 9 (ref 5–15)
BUN: <5 mg/dL — ABNORMAL LOW (ref 6–20)
CHLORIDE: 109 mmol/L (ref 98–111)
CO2: 21 mmol/L — AB (ref 22–32)
Calcium: 9.2 mg/dL (ref 8.9–10.3)
Creatinine, Ser: 0.96 mg/dL (ref 0.44–1.00)
GFR calc non Af Amer: >60 mL/min (ref >60)
GLUCOSE: 99 mg/dL (ref 70–99)
POTASSIUM: 3.4 mmol/L — AB (ref 3.5–5.1)
Sodium: 139 mmol/L (ref 135–145)

## 2017-11-30 LAB — CBC WITH DIFFERENTIAL/PLATELET
Basophils Absolute: 0.1 10*3/uL (ref 0.0–0.1)
Basophils Relative: 1 %
EOS PCT: 1 %
Eosinophils Absolute: 0.1 10*3/uL (ref 0.0–0.7)
HCT: 29.9 % — ABNORMAL LOW (ref 36.0–46.0)
HEMOGLOBIN: 8.4 g/dL — AB (ref 12.0–15.0)
LYMPHS ABS: 2.4 10*3/uL (ref 0.7–4.0)
LYMPHS PCT: 32 %
MCH: 19 pg — ABNORMAL LOW (ref 26.0–34.0)
MCHC: 28.1 g/dL — ABNORMAL LOW (ref 30.0–36.0)
MCV: 67.5 fL — AB (ref 78.0–100.0)
MONOS PCT: 9 %
Monocytes Absolute: 0.7 10*3/uL (ref 0.1–1.0)
NEUTROS PCT: 57 %
Neutro Abs: 4.1 10*3/uL (ref 1.7–7.7)
Platelets: 345 10*3/uL (ref 150–400)
RBC: 4.43 MIL/uL (ref 3.87–5.11)
RDW: 19.2 % — ABNORMAL HIGH (ref 11.5–15.5)
WBC: 7.4 10*3/uL (ref 4.0–10.5)

## 2017-11-30 LAB — I-STAT TROPONIN, ED
TROPONIN I, POC: 0.01 ng/mL (ref 0.00–0.08)
Troponin i, poc: 0 ng/mL (ref 0.00–0.08)

## 2017-11-30 LAB — D-DIMER, QUANTITATIVE: D-Dimer, Quant: 0.92 ug/mL-FEU — ABNORMAL HIGH (ref 0.00–0.50)

## 2017-11-30 MED ORDER — IOPAMIDOL (ISOVUE-370) INJECTION 76%
100.0000 mL | Freq: Once | INTRAVENOUS | Status: AC | PRN
  Administered 2017-11-30: 100 mL via INTRAVENOUS

## 2017-11-30 MED ORDER — ASPIRIN 81 MG PO CHEW
324.0000 mg | CHEWABLE_TABLET | Freq: Once | ORAL | Status: AC
  Administered 2017-11-30: 324 mg via ORAL
  Filled 2017-11-30: qty 4

## 2017-11-30 MED ORDER — LORAZEPAM 2 MG/ML IJ SOLN
1.0000 mg | Freq: Once | INTRAMUSCULAR | Status: AC
  Administered 2017-11-30: 1 mg via INTRAVENOUS
  Filled 2017-11-30: qty 1

## 2017-11-30 MED ORDER — IOPAMIDOL (ISOVUE-370) INJECTION 76%
INTRAVENOUS | Status: AC
  Filled 2017-11-30: qty 100

## 2017-11-30 NOTE — ED Notes (Signed)
Patient transported to CT 

## 2017-11-30 NOTE — Progress Notes (Signed)
Patient experienced mild nausea post contrast injection. No reaction noted.

## 2017-11-30 NOTE — ED Provider Notes (Signed)
[facility] EMERGENCY DEPARTMENT Provider Note   CSN: [id number] Arrival date & time: 11/30/17  [place]     History   Chief Complaint Chief Complaint  Patient presents with  . Chest Pain  . Shortness of Breath    HPI Victoria Bender is a 47 y.o. female.  HPI  47 year old female presents with an anxiety attack and palpitations.  She states within the last hour and a half or so, she all of a sudden started developing palpitations while at rest on the couch.  She is also developed some intermittent left-sided chest pain under her left breast.  Comes and goes and lasts about a minute at a time.  Has a hard time describing what it feels like.  Also feels very short of breath and anxious like a panic attack.  However the palpitations do not normally occur.  Chronic pedal edema but no new swelling, unilateral leg swelling, or recent travel/surgery. No current palpitations.  Past Medical History:  Diagnosis Date  . Chronic epigastric pain    recurrent discrete episodes  . 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 Abdominal pain, epigastric 10/14/2010  . Nausea with vomiting 10/14/2010    Past Surgical History:  Procedure Laterality Date  . CHOLECYSTECTOMY  2012  . TUBAL LIGATION    . UPPER GASTROINTESTINAL ENDOSCOPY  11/25/2010   NORMAL     OB History    Gravida  3   Para  2   Term  2   Preterm      AB  1   Living  2     SAB  1   TAB      Ectopic      Multiple      Live Births               Home Medications    Prior to Admission medications   Medication Sig Start Date End Date Taking? Authorizing Provider  HYDROcodone-acetaminophen (NORCO/VICODIN) 5-325 MG tablet Take 1 tablet by mouth every 6 (six) hours as needed for severe pain. Patient not taking: Reported on 11/30/2017 08/26/16   Recardo Evangelist, PA-C  ibuprofen (ADVIL,MOTRIN) 600 MG tablet Take 1 tablet (600 mg total) by  mouth every 6 (six) hours as needed. Patient not taking: Reported on 11/30/2017 08/26/16   Recardo Evangelist, PA-C  methocarbamol (ROBAXIN) 500 MG tablet Take 1 tablet (500 mg total) by mouth at bedtime and may repeat dose one time if needed. Patient not taking: Reported on 11/30/2017 08/26/16   Recardo Evangelist, PA-C    Family History Family History  Problem Relation Age of Onset  . Hypertension Father   . Heart attack Brother   . Colon polyps Brother   . Pancreatic cancer Maternal Aunt   . Diabetes Maternal Grandmother   . Colon cancer Neg Hx     Social History Social History   Tobacco Use  . Smoking status: Never Smoker  . Smokeless tobacco: Never Used  Substance Use Topics  . Alcohol use: No    Comment: social  . Drug use: No     Allergies   Patient has no known allergies.   Review of Systems Review of Systems  Constitutional: Negative for fever.  Respiratory: Positive for shortness of breath.   Cardiovascular: Positive for chest pain and palpitations.  Gastrointestinal: Negative for abdominal pain and vomiting.  Psychiatric/Behavioral: The patient is  nervous/anxious.   All other systems reviewed and are negative.    Physical Exam Updated Vital Signs BP 122/71   Pulse 99   Temp 98.6 F (37 C) (Oral)   Resp 18   Ht 5\' 5"  (1.651 m)   Wt 104.3 kg   SpO2 98%   BMI 38.27 kg/m   Physical Exam  Constitutional: She is oriented to person, place, and time. She appears well-developed and well-nourished.  Non-toxic appearance. She does not appear ill.  HENT:  Head: Normocephalic and atraumatic.  Right Ear: External ear normal.  Left Ear: External ear normal.  Nose: Nose normal.  Eyes: Right eye exhibits no discharge. Left eye exhibits no discharge.  Cardiovascular: Normal rate, regular rhythm and normal heart sounds.  Pulmonary/Chest: Breath sounds normal. Tachypnea noted. She has no decreased breath sounds. She exhibits tenderness.  Short, frequent breaths -  hyperventilating    Abdominal: Soft. There is no tenderness.  Musculoskeletal:       Right lower leg: She exhibits no edema.       Left lower leg: She exhibits no edema.  Neurological: She is alert and oriented to person, place, and time.  Skin: Skin is warm and dry.  Psychiatric: Her mood appears anxious.  Nursing note and vitals reviewed.    ED Treatments / Results  Labs (all labs ordered are listed, but only abnormal results are displayed) Labs Reviewed  CBC WITH DIFFERENTIAL/PLATELET - Abnormal; Notable for the following components:      Result Value   Hemoglobin 8.4 (*)    HCT 29.9 (*)    MCV 67.5 (*)    MCH 19.0 (*)    MCHC 28.1 (*)    RDW 19.2 (*)    All other components within normal limits  BASIC METABOLIC PANEL - Abnormal; Notable for the following components:   Potassium 3.4 (*)    CO2 21 (*)    BUN <5 (*)    All other components within normal limits  D-DIMER, QUANTITATIVE (NOT AT [facility]) - Abnormal; Notable for the following components:   D-Dimer, Quant 0.92 (*)    All other components within normal limits  I-STAT TROPONIN, ED  I-STAT TROPONIN, ED    EKG EKG Interpretation  Date/Time:  Monday November 30 2017 18:49:12 EDT Ventricular Rate:  94 PR Interval:    QRS Duration: 75 QT Interval:  320 QTC Calculation: 401 R Axis:   46 Text Interpretation:  Sinus rhythm Abnormal R-wave progression, early transition no significant change since Dec 2018 Confirmed by Sherwood Gambler [phone]) on 11/30/2017 7:01:42 PM   Radiology Dg Chest 2 View  Result Date: 11/30/2017 CLINICAL DATA:  Chest pain and palpitations EXAM: CHEST - 2 VIEW COMPARISON:  03/15/2017 FINDINGS: The heart size and mediastinal contours are within normal limits. Mild eventration of the left hemidiaphragm similar to prior. No effusion or overt pulmonary edema. No pulmonary consolidations. The visualized skeletal structures are unremarkable. IMPRESSION: No active cardiopulmonary disease.  Electronically Signed   By: Ashley Royalty M.D.   On: 11/30/2017 20:51   Ct Angio Chest Pe W/cm &/or Wo Cm  Result Date: 11/30/2017 CLINICAL DATA:  47 year old female with shortness of breath and chest pain. Concern for pulmonary embolism. EXAM: CT ANGIOGRAPHY CHEST WITH CONTRAST TECHNIQUE: Multidetector CT imaging of the chest was performed using the standard protocol during bolus administration of intravenous contrast. Multiplanar CT image reconstructions and MIPs were obtained to evaluate the vascular anatomy. CONTRAST:  158mL ISOVUE-370 IOPAMIDOL (ISOVUE-370) INJECTION 76% COMPARISON:  Chest radiograph dated 11/30/2017 FINDINGS: Cardiovascular: There is no cardiomegaly or pericardial effusion. The thoracic aorta is unremarkable. There is no CT evidence of pulmonary embolism. Mediastinum/Nodes: There is no hilar or mediastinal adenopathy. Esophagus and the thyroid gland are grossly unremarkable. No mediastinal fluid collection. Lungs/Pleura: Lungs are clear. No pleural effusion or pneumothorax. Upper Abdomen: No acute abnormality. Musculoskeletal: No chest wall abnormality. No acute or significant osseous findings. Review of the MIP images confirms the above findings. IMPRESSION: No acute intrathoracic pathology. No CT evidence of pulmonary embolism. Electronically Signed   By: Anner Crete M.D.   On: 11/30/2017 22:25    Procedures Procedures (including critical care time)  Medications Ordered in ED Medications  iopamidol (ISOVUE-370) 76 % injection (has no administration in time range)  aspirin chewable tablet 324 mg (324 mg Oral Given 11/30/17 2021)  LORazepam (ATIVAN) injection 1 mg (1 mg Intravenous Given 11/30/17 2021)  iopamidol (ISOVUE-370) 76 % injection 100 mL (100 mLs Intravenous Contrast Given 11/30/17 2158)     Initial Impression / Assessment and Plan / ED Course  I have reviewed the triage vital signs and the nursing notes.  Pertinent labs & imaging results that were available during  my care of the patient were reviewed by me and considered in my medical decision making (see chart for details).     The patient's chest pain is atypical and unlikely to be ACS.  However given her age, ECG and troponin evaluated.  Given the acute onset of her symptoms this afternoon, second troponin needed given negative first.  Unfortunately, the nurse drew it an hour to early for her to be a 3-hour troponin.  I discussed this with patient and she would rather go home now given it is continuing to be negative.  I think the odds of her having a positive 3-hour troponin are pretty low as this is more likely anxiety related given her past history and current symptoms.  She was evaluated for possible PE given her heart rate was right around 100 with the tachypnea but after positive d-dimer her CT is negative.  I think she stable for discharge home and encouraged to follow-up with her PCP regarding her on and off anxiety issues.  Return precautions.  Final Clinical Impressions(s) / ED Diagnoses   Final diagnoses:  Nonspecific chest pain  Anxiety attack    ED Discharge Orders    None       Sherwood Gambler, MD 12/01/17 0005

## 2017-11-30 NOTE — Discharge Instructions (Addendum)
If you develop worsening or recurrent chest pain, or if you develop vomiting, shortness of breath, or any other new/concerning symptoms and return to the ER for evaluation.  Follow-up with your primary care physician.  If you do not have one, follow-up with the [facility].

## 2017-11-30 NOTE — ED Triage Notes (Signed)
Patient's son lost his debit card 2-3 days ago and patient has felt anxiety sincet then. This afternoon patient felt chest pain and SOB and took a xanax art home. After no relief patient called EMS.

## 2017-12-07 ENCOUNTER — Other Ambulatory Visit: Payer: Self-pay | Admitting: Family Medicine

## 2017-12-07 DIAGNOSIS — Z1231 Encounter for screening mammogram for malignant neoplasm of breast: Secondary | ICD-10-CM

## 2017-12-08 ENCOUNTER — Encounter (HOSPITAL_COMMUNITY): Payer: Self-pay | Admitting: Emergency Medicine

## 2017-12-08 ENCOUNTER — Emergency Department (HOSPITAL_COMMUNITY): Payer: Medicaid Other

## 2017-12-08 ENCOUNTER — Other Ambulatory Visit: Payer: Self-pay

## 2017-12-08 ENCOUNTER — Emergency Department (HOSPITAL_COMMUNITY)
Admission: EM | Admit: 2017-12-08 | Discharge: 2017-12-08 | Discharge status: Against Medical Advice | Payer: Medicaid Other | Attending: Emergency Medicine | Admitting: Emergency Medicine

## 2017-12-08 DIAGNOSIS — Z5321 Procedure and treatment not carried out due to patient leaving prior to being seen by health care provider: Secondary | ICD-10-CM | POA: Insufficient documentation

## 2017-12-08 DIAGNOSIS — R079 Chest pain, unspecified: Secondary | ICD-10-CM | POA: Insufficient documentation

## 2017-12-08 LAB — BASIC METABOLIC PANEL
ANION GAP: 11 (ref 5–15)
CALCIUM: 9.4 mg/dL (ref 8.9–10.3)
CO2: 21 mmol/L — ABNORMAL LOW (ref 22–32)
Chloride: 107 mmol/L (ref 98–111)
Creatinine, Ser: 0.84 mg/dL (ref 0.44–1.00)
GFR calc Af Amer: >60 mL/min (ref >60)
GLUCOSE: 100 mg/dL — AB (ref 70–99)
Potassium: 4.1 mmol/L (ref 3.5–5.1)
SODIUM: 139 mmol/L (ref 135–145)

## 2017-12-08 LAB — CBC
HCT: 30.1 % — ABNORMAL LOW (ref 36.0–46.0)
HEMOGLOBIN: 8.5 g/dL — AB (ref 12.0–15.0)
MCH: 19.2 pg — ABNORMAL LOW (ref 26.0–34.0)
MCHC: 28.2 g/dL — AB (ref 30.0–36.0)
MCV: 67.9 fL — ABNORMAL LOW (ref 78.0–100.0)
Platelets: 348 10*3/uL (ref 150–400)
RBC: 4.43 MIL/uL (ref 3.87–5.11)
RDW: 19.6 % — AB (ref 11.5–15.5)
WBC: 7.2 10*3/uL (ref 4.0–10.5)

## 2017-12-08 LAB — I-STAT TROPONIN, ED: TROPONIN I, POC: 0.02 ng/mL (ref 0.00–0.08)

## 2017-12-08 LAB — I-STAT BETA HCG BLOOD, ED (MC, WL, AP ONLY): I-stat hCG, quantitative: <5 m[IU]/mL (ref <5)

## 2017-12-08 NOTE — ED Triage Notes (Addendum)
Pt reports she was driving home when she started having sob and left chest aching and then started having an anxiety attack. Upon EMS arrival, R 40-50. EMS gave 324 asa. 12 lead unremarkable, sinus tach at 101. Pain went from a 7 to a 0 after aspirin. Lungs clear per ems.  EMS VSS BP 118/84, HR 90, 100% room air.

## 2017-12-08 NOTE — ED Notes (Signed)
No answer x2 

## 2017-12-08 NOTE — ED Notes (Signed)
Pt discharged, no response to name X3.

## 2017-12-13 ENCOUNTER — Emergency Department (HOSPITAL_COMMUNITY)
Admission: EM | Admit: 2017-12-13 | Discharge: 2017-12-13 | Disposition: A | Discharge status: Home / Self Care | Payer: Self-pay | Attending: Emergency Medicine | Admitting: Emergency Medicine

## 2017-12-13 ENCOUNTER — Encounter (HOSPITAL_COMMUNITY): Payer: Self-pay | Admitting: Emergency Medicine

## 2017-12-13 ENCOUNTER — Emergency Department (HOSPITAL_COMMUNITY): Payer: Medicaid Other

## 2017-12-13 ENCOUNTER — Other Ambulatory Visit: Payer: Self-pay

## 2017-12-13 DIAGNOSIS — J4541 Moderate persistent asthma with (acute) exacerbation: Secondary | ICD-10-CM | POA: Insufficient documentation

## 2017-12-13 DIAGNOSIS — Z79899 Other long term (current) drug therapy: Secondary | ICD-10-CM | POA: Insufficient documentation

## 2017-12-13 LAB — CBC WITH DIFFERENTIAL/PLATELET
BASOS ABS: 0 10*3/uL (ref 0.0–0.1)
Basophils Relative: 0 %
EOS PCT: 0 %
Eosinophils Absolute: 0 10*3/uL (ref 0.0–0.7)
HCT: 27.6 % — ABNORMAL LOW (ref 36.0–46.0)
Hemoglobin: 7.9 g/dL — ABNORMAL LOW (ref 12.0–15.0)
Lymphocytes Relative: 18 %
Lymphs Abs: 1.3 10*3/uL (ref 0.7–4.0)
MCH: 18.9 pg — ABNORMAL LOW (ref 26.0–34.0)
MCHC: 28.6 g/dL — ABNORMAL LOW (ref 30.0–36.0)
MCV: 65.9 fL — ABNORMAL LOW (ref 78.0–100.0)
MONO ABS: 0.4 10*3/uL (ref 0.1–1.0)
MONOS PCT: 6 %
NEUTROS PCT: 76 %
Neutro Abs: 5.5 10*3/uL (ref 1.7–7.7)
PLATELETS: 419 10*3/uL — AB (ref 150–400)
RBC: 4.19 MIL/uL (ref 3.87–5.11)
RDW: 19.2 % — ABNORMAL HIGH (ref 11.5–15.5)
WBC: 7.2 10*3/uL (ref 4.0–10.5)

## 2017-12-13 LAB — BASIC METABOLIC PANEL
ANION GAP: 10 (ref 5–15)
BUN: 7 mg/dL (ref 6–20)
CALCIUM: 9.5 mg/dL (ref 8.9–10.3)
CO2: 21 mmol/L — ABNORMAL LOW (ref 22–32)
Chloride: 109 mmol/L (ref 98–111)
Creatinine, Ser: 0.78 mg/dL (ref 0.44–1.00)
Glucose, Bld: 99 mg/dL (ref 70–99)
POTASSIUM: 3.5 mmol/L (ref 3.5–5.1)
SODIUM: 140 mmol/L (ref 135–145)

## 2017-12-13 LAB — I-STAT BETA HCG BLOOD, ED (MC, WL, AP ONLY): I-stat hCG, quantitative: <5 m[IU]/mL (ref <5)

## 2017-12-13 LAB — I-STAT TROPONIN, ED: TROPONIN I, POC: 0 ng/mL (ref 0.00–0.08)

## 2017-12-13 MED ORDER — ALBUTEROL SULFATE HFA 108 (90 BASE) MCG/ACT IN AERS
6.0000 | INHALATION_SPRAY | Freq: Once | RESPIRATORY_TRACT | Status: AC
  Administered 2017-12-13: 6 via RESPIRATORY_TRACT
  Filled 2017-12-13: qty 6.7

## 2017-12-13 MED ORDER — DEXAMETHASONE 4 MG PO TABS
10.0000 mg | ORAL_TABLET | Freq: Once | ORAL | Status: AC
  Administered 2017-12-13: 10 mg via ORAL
  Filled 2017-12-13: qty 2

## 2017-12-13 NOTE — Discharge Instructions (Signed)
Use your inhaler every 4 hours(6 puffs) while awake, return for sudden worsening shortness of breath, or if you need to use your inhaler more often.  ° °

## 2017-12-13 NOTE — ED Triage Notes (Addendum)
Pt continues with SOB, Pt states she first experienced SOB several weeks ago and continues to have episodes of SOB especially with exertion and states she feels like heart is "skipping beats". No distress in triage, pt denies pain. Pt began hyperventilating in triage, sats remain at 100%, pt was able to slow breathing, but states she has episodes of hyperventilating when she ambulates.

## 2017-12-13 NOTE — ED Provider Notes (Signed)
[facility] DEPT Provider Note   CSN: [id number] Arrival date & time: 12/13/17  1359     History   Chief Complaint Chief Complaint  Patient presents with  . Shortness of Breath    HPI Victoria Bender is a 47 y.o. female.  47 yo F with a chief complaint of shortness of breath.  Going on for the past couple days.  Patient has a history of asthma and thinks this feels the same.  She has been using her home Symbicort with minimal relief though it was worsening today she is states and then when she came to the ED she felt that it had resolved.  She denies hemoptysis denies unilateral lower extremity edema denies recent surgery denies recent prolonged immobilization.  Denies history of PE or DVT.  The history is provided by the patient.  Shortness of Breath  This is a new problem. The average episode lasts 2 days. The problem occurs continuously.The current episode started 2 days ago. The problem has been gradually worsening. Pertinent negatives include no fever, no headaches, no rhinorrhea, no wheezing, no chest pain and no vomiting. She has tried inhaled steroids for the symptoms. The treatment provided moderate relief. She has had no prior hospitalizations. She has had prior ED visits. She has had no prior ICU admissions. Associated medical issues include asthma.    Past Medical History:  Diagnosis Date  . Chronic epigastric pain    recurrent discrete episodes  . 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 Abdominal pain, epigastric 10/14/2010  . Nausea with vomiting 10/14/2010    Past Surgical History:  Procedure Laterality Date  . CHOLECYSTECTOMY  2012  . TUBAL LIGATION    . UPPER GASTROINTESTINAL ENDOSCOPY  11/25/2010   NORMAL     OB History    Gravida  3   Para  2   Term  2   Preterm      AB  1   Living  2     SAB  1   TAB      Ectopic      Multiple      Live Births                 Home Medications    Prior to Admission medications   Medication Sig Start Date End Date Taking? Authorizing Provider  budesonide-formoterol (SYMBICORT) 160-4.5 MCG/ACT inhaler Inhale 1 puff into the lungs 2 (two) times daily.   Yes [provider]  fluticasone (FLONASE) 50 MCG/ACT nasal spray Place 1 spray into both nostrils daily.   Yes [provider]  ibuprofen (ADVIL,MOTRIN) 200 MG tablet Take 400 mg by mouth every 6 (six) hours as needed for moderate pain.   Yes [provider]  montelukast (SINGULAIR) 10 MG tablet Take 10 mg by mouth at bedtime.   Yes [provider]  Prenatal Vit-Fe Fumarate-FA (PRENATAL VITAMIN PLUS LOW IRON PO) Take 1 tablet by mouth daily.   Yes [provider]  rOPINIRole (REQUIP) 0.25 MG tablet Take 0.5 mg by mouth at bedtime.   Yes [provider]  HYDROcodone-acetaminophen (NORCO/VICODIN) 5-325 MG tablet Take 1 tablet by mouth every 6 (six) hours as needed for severe pain. Patient not taking: Reported on 11/30/2017 08/26/16   Recardo Evangelist, PA-C  ibuprofen (ADVIL,MOTRIN) 600 MG tablet Take 1 tablet (600 mg total) by mouth every 6 (six) hours as  needed. Patient not taking: Reported on 11/30/2017 08/26/16   Recardo Evangelist, PA-C  methocarbamol (ROBAXIN) 500 MG tablet Take 1 tablet (500 mg total) by mouth at bedtime and may repeat dose one time if needed. Patient not taking: Reported on 11/30/2017 08/26/16   Recardo Evangelist, PA-C    Family History Family History  Problem Relation Age of Onset  . Hypertension Father   . Heart attack Brother   . Colon polyps Brother   . Pancreatic cancer Maternal Aunt   . Diabetes Maternal Grandmother   . Colon cancer Neg Hx     Social History Social History   Tobacco Use  . Smoking status: Never Smoker  . Smokeless tobacco: Never Used  Substance Use Topics  . Alcohol use: No    Comment: social  . Drug use: No     Allergies   Patient has  no known allergies.   Review of Systems Review of Systems  Constitutional: Negative for chills and fever.  HENT: Negative for congestion and rhinorrhea.   Eyes: Negative for redness and visual disturbance.  Respiratory: Positive for chest tightness and shortness of breath. Negative for wheezing.   Cardiovascular: Negative for chest pain and palpitations.  Gastrointestinal: Negative for nausea and vomiting.  Genitourinary: Negative for dysuria and urgency.  Musculoskeletal: Negative for arthralgias and myalgias.  Skin: Negative for pallor and wound.  Neurological: Negative for dizziness and headaches.     Physical Exam Updated Vital Signs BP 123/75   Pulse (!) 103   Resp (!) 24   LMP 12/08/2017 (Exact Date)   SpO2 100%   Physical Exam  Constitutional: She is oriented to person, place, and time. She appears well-developed and well-nourished. No distress.  HENT:  Head: Normocephalic and atraumatic.  No noted nasal drainage, no noted sinus ttp, tm normal bilaterally.    Eyes: Pupils are equal, round, and reactive to light. EOM are normal.  Neck: Normal range of motion. Neck supple.  Cardiovascular: Normal rate and regular rhythm. Exam reveals no gallop and no friction rub.  No murmur heard. Pulmonary/Chest: Effort normal. She has decreased breath sounds in the left lower field. She has no wheezes. She has no rales.  Mild tachypnea, diminished breath sounds in the left lower fields.  Prolonged expiratory effort.  Abdominal: Soft. She exhibits no distension. There is no tenderness.  Musculoskeletal: She exhibits no edema or tenderness.  Neurological: She is alert and oriented to person, place, and time.  Skin: Skin is warm and dry. She is not diaphoretic.  Psychiatric: She has a normal mood and affect. Her behavior is normal.  Nursing note and vitals reviewed.    ED Treatments / Results  Labs (all labs ordered are listed, but only abnormal results are displayed) Labs  Reviewed  CBC WITH DIFFERENTIAL/PLATELET - Abnormal; Notable for the following components:      Result Value   Hemoglobin 7.9 (*)    HCT 27.6 (*)    MCV 65.9 (*)    MCH 18.9 (*)    MCHC 28.6 (*)    RDW 19.2 (*)    Platelets 419 (*)    All other components within normal limits  BASIC METABOLIC PANEL - Abnormal; Notable for the following components:   CO2 21 (*)    All other components within normal limits  I-STAT BETA HCG BLOOD, ED ([facility], [facility], [facility] ONLY)  I-STAT TROPONIN, ED    EKG EKG Interpretation  Date/Time:  Sunday December 13 2017 14:11:33 EDT Ventricular  Rate:  108 PR Interval:    QRS Duration: 70 QT Interval:  337 QTC Calculation: 452 R Axis:   52 Text Interpretation:  Sinus tachycardia Low voltage, precordial leads Abnormal R-wave progression, early transition Borderline repolarization abnormality Baseline wander in lead(s) V4 TECHNICALLY DIFFICULT wavering baseline in multilple leads Otherwise no significant change Confirmed by Deno Etienne [phone]) on 12/13/2017 3:46:04 PM   Radiology Dg Chest 2 View  Result Date: 12/13/2017 CLINICAL DATA:  Patient with shortness of breath. EXAM: CHEST - 2 VIEW COMPARISON:  Chest radiograph 12/08/2017. FINDINGS: Monitoring leads overlie the patient. Normal cardiac and mediastinal contours. No consolidative pulmonary opacities. No pleural effusion or pneumothorax. IMPRESSION: No acute cardiopulmonary process. Electronically Signed   By: Lovey Newcomer M.D.   On: 12/13/2017 16:20    Procedures Procedures (including critical care time)  Medications Ordered in ED Medications  albuterol (PROVENTIL HFA;VENTOLIN HFA) 108 (90 Base) MCG/ACT inhaler 6 puff (6 puffs Inhalation Given 12/13/17 1626)  dexamethasone (DECADRON) tablet 10 mg (10 mg Oral Given 12/13/17 1626)     Initial Impression / Assessment and Plan / ED Course  I have reviewed the triage vital signs and the nursing notes.  Pertinent labs & imaging results that were available during  my care of the patient were reviewed by me and considered in my medical decision making (see chart for details).     47 yo F with a chief complaint of shortness of breath.  Going on for the past couple days.  Feels like her prior asthma.  Patient has tried her Symbicort without improvement.  She does not have a rescue inhaler at home.  My exam with mild prolonged expiration in all lung fields.  She is feeling much better by the time she was back to her room.  Said that her mom made her come to the ED.  She is a bit tachycardic, and so I am unable to use the [facility] rule.  She was however seen in the ED less than 2 weeks ago and had a positive d-dimer and CT angiogram of the chest that was negative for PE.  It seems unlikely that she had a large PE that was missed with a good time scan then that would be evident today.  She is not hypotensive, not hypoxic.  I do not feel strongly about repeating a d-dimer that will likely be positive as it was previously or a CT angiogram.  We will give 6 puffs of albuterol, Decadron.  Reassess.  The patient continues to feel well on reassessment.  She feels that her breathing is continuing to be well.  Her hemoglobin is mildly lower than it was on last exam but within the range of lab error.  I discussed with the family about following up as an outpatient.  There is somewhat apprehensive as she has had recurrent episodes over the past month or so.  I feel this patient would benefit from a pulmonology consultation as she does not yet have a known diagnosis of asthma.  She was given an albuterol inhaler to take home.  Will use every 4 hours while awake.  5:49 PM:  I have discussed the diagnosis/risks/treatment options with the patient and family and believe the pt to be eligible for discharge home to follow-up with PCP, Pulm. We also discussed returning to the ED immediately if new or worsening sx occur. We discussed the sx which are most concerning (e.g., sudden worsening sob,  need to use inhaler more often than  every 4 hours, fever, inability to tolerate by mouth) that necessitate immediate return. Medications administered to the patient during their visit and any new prescriptions provided to the patient are listed below.  Medications given during this visit Medications  albuterol (PROVENTIL HFA;VENTOLIN HFA) 108 (90 Base) MCG/ACT inhaler 6 puff (6 puffs Inhalation Given 12/13/17 1626)  dexamethasone (DECADRON) tablet 10 mg (10 mg Oral Given 12/13/17 1626)      The patient appears reasonably screen and/or stabilized for discharge and I doubt any other medical condition or other [facility] requiring further screening, evaluation, or treatment in the ED at this time prior to discharge.    Final Clinical Impressions(s) / ED Diagnoses   Final diagnoses:  Moderate persistent asthma with exacerbation    ED Discharge Orders    None       Deno Etienne, DO 12/13/17 1749

## 2017-12-18 ENCOUNTER — Encounter: Payer: Self-pay | Admitting: Pulmonary Disease

## 2017-12-18 ENCOUNTER — Other Ambulatory Visit (INDEPENDENT_AMBULATORY_CARE_PROVIDER_SITE_OTHER): Payer: PRIVATE HEALTH INSURANCE

## 2017-12-18 ENCOUNTER — Other Ambulatory Visit: Payer: Self-pay | Admitting: *Deleted

## 2017-12-18 ENCOUNTER — Ambulatory Visit (INDEPENDENT_AMBULATORY_CARE_PROVIDER_SITE_OTHER): Payer: PRIVATE HEALTH INSURANCE | Admitting: Pulmonary Disease

## 2017-12-18 VITALS — BP 130/70 | HR 92 | Ht 65.0 in | Wt 225.0 lb

## 2017-12-18 DIAGNOSIS — R0602 Shortness of breath: Secondary | ICD-10-CM

## 2017-12-18 LAB — CBC WITH DIFFERENTIAL/PLATELET
BASOS ABS: 0 10*3/uL (ref 0.0–0.1)
Basophils Relative: 0.5 % (ref 0.0–3.0)
EOS PCT: 1.1 % (ref 0.0–5.0)
Eosinophils Absolute: 0.1 10*3/uL (ref 0.0–0.7)
HCT: 28.7 % — ABNORMAL LOW (ref 36.0–46.0)
Hemoglobin: 8.8 g/dL — ABNORMAL LOW (ref 12.0–15.0)
LYMPHS ABS: 2.1 10*3/uL (ref 0.7–4.0)
Lymphocytes Relative: 21.9 % (ref 12.0–46.0)
MCHC: 30.8 g/dL (ref 30.0–36.0)
MCV: 62.7 fl — AB (ref 78.0–100.0)
MONO ABS: 0.6 10*3/uL (ref 0.1–1.0)
MONOS PCT: 5.9 % (ref 3.0–12.0)
NEUTROS ABS: 6.8 10*3/uL (ref 1.4–7.7)
Neutrophils Relative %: 70.6 % (ref 43.0–77.0)
PLATELETS: 417 10*3/uL — AB (ref 150.0–400.0)
RBC: 4.57 Mil/uL (ref 3.87–5.11)
RDW: 21.1 % — ABNORMAL HIGH (ref 11.5–15.5)
WBC: 9.6 10*3/uL (ref 4.0–10.5)

## 2017-12-18 LAB — FERRITIN: FERRITIN: 4 ng/mL — AB (ref 10.0–291.0)

## 2017-12-18 LAB — IBC PANEL
Iron: 73 ug/dL (ref 42–145)
Saturation Ratios: 12.7 % — ABNORMAL LOW (ref 20.0–50.0)
Transferrin: 409 mg/dL — ABNORMAL HIGH (ref 212.0–360.0)

## 2017-12-18 LAB — NITRIC OXIDE: Nitric Oxide: 9

## 2017-12-18 NOTE — Progress Notes (Signed)
Labs could not be found when being collected by [email or web address]

## 2017-12-18 NOTE — Patient Instructions (Addendum)
We will check a CBC differential,IgE, pulmonary function test Continue the Symbicort and albuterol as needed. Follow-up in 1 to 2 months.

## 2017-12-18 NOTE — Progress Notes (Signed)
Victoria Bender    [id number]    11-22-1970  Primary Care Physician:Richter, Maebelle Munroe, MD  Referring Physician: Hayden Rasmussen, MD [address] [place], [place] [address]  Chief complaint: Consult for dyspnea  HPI: 47 year old with past medical history of GERD, panic attacks, anxiety Complains of recurrent attacks of dyspnea on minimal exertion for the past 2 to 3 months.  There are no other exacerbating factors.  Symptoms usually relieved while resting and relaxation Cough, sputum production, nocturnal awakenings.  She has been evaluated in the ED on 11/30/2017 and 12/13/17 with a negative CT angiogram.  Also given nebulizer, Decadron Diagnosed with asthma and allergies few weeks ago and she is currently on Symbicort and albuterol as needed.  She feels that the inhalers do not help.  Denies seasonal allergies and she has history of GERD about 20 years ago but no recent symptoms. She had multiple ED visits but no intubations for respiratory failure.  Pets: No pets Occupation: Materials engineer Exposures: She is living at home with mold 1 year ago.  Her current home does not have any mold issues.  No jaw tub, Jacuzzi Smoking history: Never smoker Travel history: No significant travel Relevant family history: No significant family history of lung disease  Outpatient Encounter Medications as of 12/18/2017  Medication Sig  . budesonide-formoterol (SYMBICORT) 160-4.5 MCG/ACT inhaler Inhale 1 puff into the lungs 2 (two) times daily.  . fluticasone (FLONASE) 50 MCG/ACT nasal spray Place 1 spray into both nostrils daily.  . montelukast (SINGULAIR) 10 MG tablet Take 10 mg by mouth at bedtime.  . Prenatal Vit-Fe Fumarate-FA (PRENATAL VITAMIN PLUS LOW IRON PO) Take 1 tablet by mouth daily.  Marland Kitchen rOPINIRole (REQUIP) 0.25 MG tablet Take 0.5 mg by mouth at bedtime.  Marland Kitchen HYDROcodone-acetaminophen (NORCO/VICODIN) 5-325 MG tablet Take 1 tablet by mouth every 6 (six) hours as needed for  severe pain. (Patient not taking: Reported on 12/18/2017)  . ibuprofen (ADVIL,MOTRIN) 200 MG tablet Take 400 mg by mouth every 6 (six) hours as needed for moderate pain.  Marland Kitchen ibuprofen (ADVIL,MOTRIN) 600 MG tablet Take 1 tablet (600 mg total) by mouth every 6 (six) hours as needed. (Patient not taking: Reported on 12/18/2017)  . methocarbamol (ROBAXIN) 500 MG tablet Take 1 tablet (500 mg total) by mouth at bedtime and may repeat dose one time if needed. (Patient not taking: Reported on 12/18/2017)   No facility-administered encounter medications on file as of 12/18/2017.     Allergies as of 12/18/2017  . (No Known Allergies)    Past Medical History:  Diagnosis Date  . Chronic epigastric pain    recurrent discrete episodes  . Panic attack as reaction to stress     Past Surgical History:  Procedure Laterality Date  . CHOLECYSTECTOMY  2012  . TUBAL LIGATION    . UPPER GASTROINTESTINAL ENDOSCOPY  11/25/2010   NORMAL    Family History  Problem Relation Age of Onset  . Hypertension Father   . Heart attack Brother   . Colon polyps Brother   . Pancreatic cancer Maternal Aunt   . Diabetes Maternal Grandmother   . Colon cancer Neg Hx     Social History   Socioeconomic History  . Marital status: Married    Spouse name: Not on file  . Number of children: 2  . Years of education: Not on file  . Highest education level: Not on file  Occupational History  .  Not on file  Social Needs  . Financial resource strain: Not on file  . Food insecurity:    Worry: Not on file    Inability: Not on file  . Transportation needs:    Medical: Not on file    Non-medical: Not on file  Tobacco Use  . Smoking status: Never Smoker  . Smokeless tobacco: Never Used  Substance and Sexual Activity  . Alcohol use: No    Comment: social  . Drug use: No  . Sexual activity: Yes    Birth control/protection: Surgical  Lifestyle  . Physical activity:    Days per week: Not on file    Minutes per session:  Not on file  . Stress: Not on file  Relationships  . Social connections:    Talks on phone: Not on file    Gets together: Not on file    Attends religious service: Not on file    Active member of club or organization: Not on file    Attends meetings of clubs or organizations: Not on file    Relationship status: Not on file  . Intimate partner violence:    Fear of current or ex partner: Not on file    Emotionally abused: Not on file    Physically abused: Not on file    Forced sexual activity: Not on file  Other Topics Concern  . Not on file  Social History Narrative  . Not on file   Review of systems: Review of Systems  Constitutional: Negative for fever and chills.  HENT: Negative.   Eyes: Negative for blurred vision.  Respiratory: as per HPI  Cardiovascular: Negative for chest pain and palpitations.  Gastrointestinal: Negative for vomiting, diarrhea, blood per rectum. Genitourinary: Negative for dysuria, urgency, frequency and hematuria.  Musculoskeletal: Negative for myalgias, back pain and joint pain.  Skin: Negative for itching and rash.  Neurological: Negative for dizziness, tremors, focal weakness, seizures and loss of consciousness.  Endo/Heme/Allergies: Negative for environmental allergies.  Psychiatric/Behavioral: Negative for depression, suicidal ideas and hallucinations.  All other systems reviewed and are negative.  Physical Exam: Blood pressure 130/70, pulse 92, height 5\' 5"  (1.651 m), weight 102.1 kg, last menstrual period 12/08/2017, SpO2 99 %. Gen:      No acute distress HEENT:  EOMI, sclera anicteric Neck:     No masses; no thyromegaly Lungs:    Clear to auscultation bilaterally; normal respiratory effort CV:         Regular rate and rhythm; no murmurs Abd:      + bowel sounds; soft, non-tender; no palpable masses, no distension Ext:    No edema; adequate peripheral perfusion Skin:      Warm and dry; no rash Neuro: alert and oriented x 3 Psych: normal  mood and affect  Data Reviewed: Imaging: CT angio 12/14/2016- no PE, clear lungs CT angio 10/25/2014-no PE, clear lungs CT angio 11/30/2017-no PE, clear lungs I have reviewed the images personally.  PFTs: FENO 12/18/17- 9  Labs: CBC 12/13/1988-WBC 7.2, eos 0, IMA globin 7.9, platelets 419  Assessment:  Consult for dyspnea Although she has a diagnosis of asthma her symptoms are somewhat atypical We will evaluate with a CBC with differential, IgE levels, pulmonary function test Continue Symbicort and albuterol for now pending review of these results  Other contributors to her dyspnea could be her anxiety, obesity, deconditioning, anemia (hb 7.9) Check iron studies, reticulocyte count, B12, folate levels If she continues to be symptomatic then consider cardiac pulmonary  exercise test.  Plan/Recommendations: - CBC, iron studies, reticulocyte count, B12 folate, IgE level - Pulmonary function test - Continue Symbicort, albuterol  Marshell Garfinkel MD [facility] 12/18/2017, 3:00 PM  CC: Hayden Rasmussen, MD

## 2017-12-19 LAB — RETICULOCYTES
ABS Retic: 92820 cells/uL — ABNORMAL HIGH (ref 20000–8000)
Retic Ct Pct: 2.1 %

## 2017-12-21 LAB — IGE: IgE (Immunoglobulin E), Serum: 53 kU/L (ref <=114)

## 2018-01-05 ENCOUNTER — Ambulatory Visit
Admission: RE | Admit: 2018-01-05 | Discharge: 2018-01-05 | Disposition: A | Discharge status: Home / Self Care | Payer: PRIVATE HEALTH INSURANCE | Source: Ambulatory Visit | Attending: Family Medicine | Admitting: Family Medicine

## 2018-01-05 DIAGNOSIS — Z1231 Encounter for screening mammogram for malignant neoplasm of breast: Secondary | ICD-10-CM

## 2018-01-14 ENCOUNTER — Encounter: Payer: Self-pay | Admitting: Hematology

## 2018-01-14 ENCOUNTER — Telehealth: Payer: Self-pay | Admitting: Hematology

## 2018-01-14 NOTE — Telephone Encounter (Signed)
New referral received from Dr. Darron Doom for IDA. Pt has been cld and scheduled to see Dr. Irene Limbo on 10/22 at 10am. Pt aware to arrive 30 minutes early. Letter mailed.

## 2018-01-19 ENCOUNTER — Inpatient Hospital Stay: Payer: Managed Care, Other (non HMO)

## 2018-01-19 ENCOUNTER — Inpatient Hospital Stay: Payer: Managed Care, Other (non HMO) | Attending: Hematology | Admitting: Hematology

## 2018-01-19 ENCOUNTER — Telehealth: Payer: Self-pay | Admitting: Hematology

## 2018-01-19 ENCOUNTER — Encounter: Payer: Self-pay | Admitting: Hematology

## 2018-01-19 VITALS — BP 111/73 | HR 77 | Temp 98.2°F | Resp 18 | Ht 65.0 in | Wt 219.6 lb

## 2018-01-19 DIAGNOSIS — Z9049 Acquired absence of other specified parts of digestive tract: Secondary | ICD-10-CM | POA: Diagnosis not present

## 2018-01-19 DIAGNOSIS — D508 Other iron deficiency anemias: Secondary | ICD-10-CM | POA: Insufficient documentation

## 2018-01-19 DIAGNOSIS — D500 Iron deficiency anemia secondary to blood loss (chronic): Secondary | ICD-10-CM

## 2018-01-19 DIAGNOSIS — D696 Thrombocytopenia, unspecified: Secondary | ICD-10-CM | POA: Insufficient documentation

## 2018-01-19 DIAGNOSIS — N920 Excessive and frequent menstruation with regular cycle: Secondary | ICD-10-CM | POA: Diagnosis not present

## 2018-01-19 LAB — CMP (CANCER CENTER ONLY)
ALT: 11 U/L (ref 0–44)
AST: 16 U/L (ref 15–41)
Albumin: 4 g/dL (ref 3.5–5.0)
Alkaline Phosphatase: 68 U/L (ref 38–126)
Anion gap: 8 (ref 5–15)
BUN: 5 mg/dL — AB (ref 6–20)
CHLORIDE: 108 mmol/L (ref 98–111)
CO2: 25 mmol/L (ref 22–32)
Calcium: 9.9 mg/dL (ref 8.9–10.3)
Creatinine: 0.88 mg/dL (ref 0.44–1.00)
GFR, Est AFR Am: >60 mL/min (ref >60)
Glucose, Bld: 93 mg/dL (ref 70–99)
POTASSIUM: 4.4 mmol/L (ref 3.5–5.1)
Sodium: 141 mmol/L (ref 135–145)
Total Bilirubin: 0.6 mg/dL (ref 0.3–1.2)
Total Protein: 7.9 g/dL (ref 6.5–8.1)

## 2018-01-19 LAB — CBC WITH DIFFERENTIAL/PLATELET
ABS IMMATURE GRANULOCYTES: 0.02 10*3/uL (ref 0.00–0.07)
Basophils Absolute: 0 10*3/uL (ref 0.0–0.1)
Basophils Relative: 1 %
Eosinophils Absolute: 0 10*3/uL (ref 0.0–0.5)
Eosinophils Relative: 1 %
HCT: 34.8 % — ABNORMAL LOW (ref 36.0–46.0)
HEMOGLOBIN: 9.9 g/dL — AB (ref 12.0–15.0)
IMMATURE GRANULOCYTES: 0 %
LYMPHS PCT: 30 %
Lymphs Abs: 1.9 10*3/uL (ref 0.7–4.0)
MCH: 21.1 pg — AB (ref 26.0–34.0)
MCHC: 28.4 g/dL — ABNORMAL LOW (ref 30.0–36.0)
MCV: 74 fL — ABNORMAL LOW (ref 80.0–100.0)
MONO ABS: 0.4 10*3/uL (ref 0.1–1.0)
Monocytes Relative: 6 %
NEUTROS ABS: 3.9 10*3/uL (ref 1.7–7.7)
Neutrophils Relative %: 62 %
Platelets: 313 10*3/uL (ref 150–400)
RBC: 4.7 MIL/uL (ref 3.87–5.11)
RDW: 23.7 % — ABNORMAL HIGH (ref 11.5–15.5)
WBC: 6.3 10*3/uL (ref 4.0–10.5)
nRBC: 0 % (ref 0.0–0.2)

## 2018-01-19 LAB — FERRITIN

## 2018-01-19 LAB — SAMPLE TO BLOOD BANK

## 2018-01-19 LAB — IRON AND TIBC
Iron: 76 ug/dL (ref 41–142)
Saturation Ratios: 15 % — ABNORMAL LOW (ref 21–57)
TIBC: 501 ug/dL — ABNORMAL HIGH (ref 236–444)
UIBC: 425 ug/dL

## 2018-01-19 LAB — VITAMIN B12: Vitamin B-12: 659 pg/mL (ref 180–914)

## 2018-01-19 NOTE — Telephone Encounter (Signed)
Appts scheduled avs/calendar printed per 10/22 los °

## 2018-01-19 NOTE — Progress Notes (Signed)
HEMATOLOGY/ONCOLOGY CONSULTATION NOTE  Date of Service: 01/19/2018  Patient Care Team: Hayden Rasmussen, MD as PCP - General (Family Medicine)  CHIEF COMPLAINTS/PURPOSE OF CONSULTATION:  Iron deficiency anemia   HISTORY OF PRESENTING ILLNESS:   Victoria Bender is a wonderful 47 y.o. female who has been referred to [place] by Dr. Horald Pollen for evaluation and management of Iron deficiency anemia. She is accompanied today by her mother. The pt reports that she is doing well overall.   The pt reports that she was first made aware of her anemia while in her 11s. She notes that she began eating bags of crushed ice 4-5 years ago. She notes in the last month she has had restless legs, and has been SOB with tightness in her chest. She notes that the tightness in her chest presents around 6pm. She notes that she has had recent family stress and does not have a good outlet for this stress so far. She notes that she has had panic attacks for the past 10 years.   The pt notes that in the last 2 years her periods have yielded clots for the first two days, and her periods have been lasting 4-5 days. She notes that she will be seeing [facility] tomorrow for her first evaluation of her heavy periods. The pt denies having [place] in the past and notes that she has always had heavy periods. The pt notes that she has been taking prenatal vitamins with iron as her iron replacement and has not taken iron replacement by itself, nor has had blood transfusions or IV Iron replacements.   She denies using NSAIDs or acid suppressants and denies any other concerns for bleeding including nose bleeds and blood in the stools.    The pt also notes that she had her gallbladder removed in 2012, and denies any other surgeries or recent surgeries.   Most recent lab results (12/18/17) of CBC w/diff is as follows: all values are WNL except for HGB at 8.8, HCT at 28.7, MCV at 62.7, RDW at 21.1, PLT at 417k. 12/18/17 Ferritin  was low at 4.0 12/18/17 IBC Panel revealed Transferrin at 409 and a 12.7% Saturation ratio.   On review of systems, pt reports restless legs, some SOB, tiredness, heavy periods, often feeling cold, stress, anxiety, and denies blood in the stools, black stools, nose bleeds, abdominal pains, leg swelling, and any other symptoms.   On PMHx the pt reports cholecystectomy in 2012, and denies asthma. On Social Hx the pt reports that she has never smoked cigarettes and consumes ETOH three times a year. She denies any recreation drug use or chemical exposure. She is married and has three children. She is not currently employed.  On Family Hx the pt reports father with prostate cancer at 22, and denies blood disorders.  Denies any known medication allergies.   MEDICAL HISTORY:  Past Medical History:  Diagnosis Date  . Chronic epigastric pain    recurrent discrete episodes  . Panic attack as reaction to stress     SURGICAL HISTORY: Past Surgical History:  Procedure Laterality Date  . CHOLECYSTECTOMY  2012  . TUBAL LIGATION    . UPPER GASTROINTESTINAL ENDOSCOPY  11/25/2010   NORMAL    SOCIAL HISTORY: Social History   Socioeconomic History  . Marital status: Married    Spouse name: Not on file  . Number of children: 2  . Years of education: Not on file  . Highest education level: Not on  file  Occupational History  . Not on file  Social Needs  . Financial resource strain: Not on file  . Food insecurity:    Worry: Not on file    Inability: Not on file  . Transportation needs:    Medical: Not on file    Non-medical: Not on file  Tobacco Use  . Smoking status: Never Smoker  . Smokeless tobacco: Never Used  Substance and Sexual Activity  . Alcohol use: No    Comment: social  . Drug use: No  . Sexual activity: Yes    Birth control/protection: Surgical  Lifestyle  . Physical activity:    Days per week: Not on file    Minutes per session: Not on file  . Stress: Not on file    Relationships  . Social connections:    Talks on phone: Not on file    Gets together: Not on file    Attends religious service: Not on file    Active member of club or organization: Not on file    Attends meetings of clubs or organizations: Not on file    Relationship status: Not on file  . Intimate partner violence:    Fear of current or ex partner: Not on file    Emotionally abused: Not on file    Physically abused: Not on file    Forced sexual activity: Not on file  Other Topics Concern  . Not on file  Social History Narrative  . Not on file    FAMILY HISTORY: Family History  Problem Relation Age of Onset  . Hypertension Father   . Heart attack Brother   . Colon polyps Brother   . Pancreatic cancer Maternal Aunt   . Diabetes Maternal Grandmother   . Colon cancer Neg Hx     ALLERGIES:  has No Known Allergies.  MEDICATIONS:  Current Outpatient Medications  Medication Sig Dispense Refill  . clonazePAM (KLONOPIN) 1 MG tablet Take 1 mg by mouth at bedtime.    . budesonide-formoterol (SYMBICORT) 160-4.5 MCG/ACT inhaler Inhale 1 puff into the lungs 2 (two) times daily.    . fluticasone (FLONASE) 50 MCG/ACT nasal spray Place 1 spray into both nostrils daily.    Marland Kitchen HYDROcodone-acetaminophen (NORCO/VICODIN) 5-325 MG tablet Take 1 tablet by mouth every 6 (six) hours as needed for severe pain. (Patient not taking: Reported on 12/18/2017) 8 tablet 0  . ibuprofen (ADVIL,MOTRIN) 200 MG tablet Take 400 mg by mouth every 6 (six) hours as needed for moderate pain.    Marland Kitchen ibuprofen (ADVIL,MOTRIN) 600 MG tablet Take 1 tablet (600 mg total) by mouth every 6 (six) hours as needed. (Patient not taking: Reported on 12/18/2017) 30 tablet 0  . methocarbamol (ROBAXIN) 500 MG tablet Take 1 tablet (500 mg total) by mouth at bedtime and may repeat dose one time if needed. (Patient not taking: Reported on 12/18/2017) 10 tablet 0  . montelukast (SINGULAIR) 10 MG tablet Take 10 mg by mouth at bedtime.    .  Prenatal Vit-Fe Fumarate-FA (PRENATAL VITAMIN PLUS LOW IRON PO) Take 1 tablet by mouth daily.    Marland Kitchen rOPINIRole (REQUIP) 0.25 MG tablet Take 0.5 mg by mouth at bedtime.     No current facility-administered medications for this visit.     REVIEW OF SYSTEMS:    10 Point review of Systems was done is negative except as noted above.  PHYSICAL EXAMINATION:  . Vitals:   01/19/18 1100  BP: 111/73  Pulse: 77  Resp: 18  Temp: 98.2 F (36.8 C)  SpO2: 100%   Filed Weights   01/19/18 1100  Weight: 219 lb 9.6 oz (99.6 kg)   .Body mass index is 36.54 kg/m.  GENERAL:alert, in no acute distress and comfortable SKIN: no acute rashes, no significant lesions EYES: conjunctiva are pink and non-injected, sclera anicteric OROPHARYNX: MMM, no exudates, no oropharyngeal erythema or ulceration NECK: supple, no JVD LYMPH:  no palpable lymphadenopathy in the cervical, axillary or inguinal regions LUNGS: clear to auscultation b/l with normal respiratory effort HEART: regular rate & rhythm ABDOMEN:  normoactive bowel sounds , non tender, not distended. Extremity: no pedal edema PSYCH: alert & oriented x 3 with fluent speech NEURO: no focal motor/sensory deficits  LABORATORY DATA:  I have reviewed the data as listed  . CBC Latest Ref Rng & Units 12/18/2017 12/13/2017 12/08/2017  WBC 4.0 - 10.5 K/uL 9.6 7.2 7.2  Hemoglobin 12.0 - 15.0 g/dL 8.8 Repeated and verified X2.(L) 7.9(L) 8.5(L)  Hematocrit 36.0 - 46.0 % 28.7(L) 27.6(L) 30.1(L)  Platelets 150.0 - 400.0 K/uL 417.0(H) 419(H) 348    . CMP Latest Ref Rng & Units 12/13/2017 12/08/2017 11/30/2017  Glucose 70 - 99 mg/dL 99 100(H) 99  BUN 6 - 20 mg/dL 7 <5(L) <5(L)  Creatinine 0.44 - 1.00 mg/dL 0.78 0.84 0.96  Sodium 135 - 145 mmol/L 140 139 139  Potassium 3.5 - 5.1 mmol/L 3.5 4.1 3.4(L)  Chloride 98 - 111 mmol/L 109 107 109  CO2 22 - 32 mmol/L 21(L) 21(L) 21(L)  Calcium 8.9 - 10.3 mg/dL 9.5 9.4 9.2  Total Protein 6.0 - 8.3 g/dL - - -  Total  Bilirubin 0.3 - 1.2 mg/dL - - -  Alkaline Phos 39 - 117 U/L - - -  AST 0 - 37 U/L - - -  ALT 0 - 35 U/L - - -   . Lab Results  Component Value Date   IRON 76 01/19/2018   TIBC 501 (H) 01/19/2018   IRONPCTSAT 15 (L) 01/19/2018   (Iron and TIBC)  Lab Results  Component Value Date   FERRITIN <4 (L) 01/19/2018    RADIOGRAPHIC STUDIES: I have personally reviewed the radiological images as listed and agreed with the findings in the report. Mm 3d Screen Breast Bilateral  Result Date: 01/05/2018 CLINICAL DATA:  Screening. EXAM: DIGITAL SCREENING BILATERAL MAMMOGRAM WITH TOMO AND CAD COMPARISON:  None. ACR Breast Density Category b: There are scattered areas of fibroglandular density. FINDINGS: There are no findings suspicious for malignancy. Images were processed with CAD. IMPRESSION: No mammographic evidence of malignancy. A result letter of this screening mammogram will be mailed directly to the patient. RECOMMENDATION: Screening mammogram in one year. (Code:SM-B-01Y) BI-RADS CATEGORY  1: Negative. Electronically Signed   By: Lajean Manes M.D.   On: 01/05/2018 12:57    ASSESSMENT & PLAN:  47 y.o. female with  1. Iron deficiency anemia likely from chronic blood loss due to menorrhagia. PLAN -Discussed patient's most recent labs from 12/18/17, anemic with HGB at 8.8, iron deficient with ferritin at 4.0, MCV at 62.7. Mild thrombocytopenia likely reactive to iron deficiency at 417k. 12% Saturation ratio.  -Discussed the options to correct her IDA with PO Iron replacement vs IV Iron replacement. The pt prefers to replace iron IV.  -Recommended that the pt continue on maintenance PO Iron while she is still having heavy periods and pursuing work up with her OBGYN regarding her menorrhagia -Recommend that the pt begin taking a Vitamin B complex -Will order  blood tests today  -Will set the pt up for IV Injectafer x weekly for 2 doses -Will see the pt back in 8 weeks    Labs today IV  Injectafer weekly x 2 doses (ASAP) RTC with Dr Irene Limbo in 8 weeks with labs    All of the patients questions were answered with apparent satisfaction. The patient knows to call the clinic with any problems, questions or concerns.  The total time spent in the appt was 35 minutes and more than 50% was on counseling and direct patient cares.    Sullivan Lone MD MS AAHIVMS [facility] Hematology/Oncology Physician [facility]  (Office):       [phone] (Work cell):  [phone] (Fax):           [phone]  01/19/2018 11:54 AM  I, Baldwin Jamaica, am acting as a scribe for Dr. Irene Limbo  .I have reviewed the above documentation for accuracy and completeness, and I agree with the above. Brunetta Genera MD

## 2018-01-21 ENCOUNTER — Telehealth: Payer: Self-pay | Admitting: *Deleted

## 2018-01-21 ENCOUNTER — Ambulatory Visit (HOSPITAL_COMMUNITY)
Admission: RE | Admit: 2018-01-21 | Discharge: 2018-01-21 | Disposition: A | Discharge status: Home / Self Care | Payer: PRIVATE HEALTH INSURANCE | Source: Ambulatory Visit | Attending: Hematology | Admitting: Hematology

## 2018-01-21 ENCOUNTER — Telehealth: Payer: Self-pay | Admitting: Hematology

## 2018-01-21 ENCOUNTER — Other Ambulatory Visit: Payer: Self-pay | Admitting: Hematology

## 2018-01-21 DIAGNOSIS — D509 Iron deficiency anemia, unspecified: Secondary | ICD-10-CM | POA: Insufficient documentation

## 2018-01-21 MED ORDER — SODIUM CHLORIDE 0.9 % IV SOLN
750.0000 mg | INTRAVENOUS | Status: DC
  Administered 2018-01-21: 750 mg via INTRAVENOUS
  Filled 2018-01-21: qty 15

## 2018-01-21 MED ORDER — SODIUM CHLORIDE 0.9 % IV SOLN
INTRAVENOUS | Status: DC | PRN
  Administered 2018-01-21: 250 mL via INTRAVENOUS

## 2018-01-21 NOTE — Telephone Encounter (Signed)
Called and spoke with Patient care center regarding appointments that were not scheduled. They stated that they had closed out of the chart before the appointments were saved.  They advised to send patient on over for treatment today.  Carolanne Grumbling was notified and patient was called and told to return to [facility] for infusion.

## 2018-01-21 NOTE — Progress Notes (Signed)
PATIENT CARE CENTER NOTE  Diagnosis: Iron Deficiency Anemia    Provider: Dr. Irene Limbo   Procedure: IV Injectafer    Note: Patient received infusion of Injectafer. Patient tolerated infusion well with no adverse reaction. Monitored patient for 30 minutes post-infusion. Vital signs stable. Discharge instructions given. Patient alert, oriented and ambulatory at discharge.  [place]

## 2018-01-21 NOTE — Discharge Instructions (Signed)
Ferric carboxymaltose injection What is this medicine? FERRIC CARBOXYMALTOSE (ferr-ik car-box-ee-mol-toes) is an iron complex. Iron is used to make healthy red blood cells, which carry oxygen and nutrients throughout the body. This medicine is used to treat anemia in people with chronic kidney disease or people who cannot take iron by mouth. This medicine may be used for other purposes; ask your health care provider or pharmacist if you have questions. COMMON BRAND NAME(S): Injectafer What should I tell my health care provider before I take this medicine? They need to know if you have any of these conditions: -anemia not caused by low iron levels -high levels of iron in the blood -liver disease -an unusual or allergic reaction to iron, other medicines, foods, dyes, or preservatives -pregnant or trying to get pregnant -breast-feeding How should I use this medicine? This medicine is for infusion into a vein. It is given by a health care professional in a hospital or clinic setting. Talk to your pediatrician regarding the use of this medicine in children. Special care may be needed. Overdosage: If you think you have taken too much of this medicine contact a poison control center or emergency room at once. NOTE: This medicine is only for you. Do not share this medicine with others. What if I miss a dose? It is important not to miss your dose. Call your doctor or health care professional if you are unable to keep an appointment. What may interact with this medicine? Do not take this medicine with any of the following medications: -deferoxamine -dimercaprol -other iron products This medicine may also interact with the following medications: -chloramphenicol -deferasirox This list may not describe all possible interactions. Give your health care provider a list of all the medicines, herbs, non-prescription drugs, or dietary supplements you use. Also tell them if you smoke, drink alcohol, or use  illegal drugs. Some items may interact with your medicine. What should I watch for while using this medicine? Visit your doctor or health care professional regularly. Tell your doctor if your symptoms do not start to get better or if they get worse. You may need blood work done while you are taking this medicine. You may need to follow a special diet. Talk to your doctor. Foods that contain iron include: whole grains/cereals, dried fruits, beans, or peas, leafy green vegetables, and organ meats (liver, kidney). What side effects may I notice from receiving this medicine? Side effects that you should report to your doctor or health care professional as soon as possible: -allergic reactions like skin rash, itching or hives, swelling of the face, lips, or tongue -breathing problems -changes in blood pressure -feeling faint or lightheaded, falls -flushing, sweating, or hot feelings Side effects that usually do not require medical attention (report to your doctor or health care professional if they continue or are bothersome): -changes in taste -constipation -dizziness -headache -nausea -pain, redness, or irritation at site where injected -vomiting This list may not describe all possible side effects. Call your doctor for medical advice about side effects. You may report side effects to FDA at 1-800-FDA-1088. Where should I keep my medicine? This drug is given in a hospital or clinic and will not be stored at home. NOTE: This sheet is a summary. It may not cover all possible information. If you have questions about this medicine, talk to your doctor, pharmacist, or health care provider.  2018 Elsevier/Gold Standard (2015-04-19 11:20:47)  

## 2018-01-21 NOTE — Telephone Encounter (Signed)
Patient arrived for appt for iron infusion at medical center on [facility]. They have no record of appt for this patient. Advised her will follow up with scheduling and either this writer or scheduling will contact patient with updated schedule.

## 2018-01-27 ENCOUNTER — Ambulatory Visit (HOSPITAL_COMMUNITY)
Admission: RE | Admit: 2018-01-27 | Discharge: 2018-01-27 | Disposition: A | Discharge status: Home / Self Care | Payer: PRIVATE HEALTH INSURANCE | Source: Ambulatory Visit | Attending: Hematology | Admitting: Hematology

## 2018-01-27 DIAGNOSIS — D509 Iron deficiency anemia, unspecified: Secondary | ICD-10-CM | POA: Diagnosis not present

## 2018-01-27 MED ORDER — SODIUM CHLORIDE 0.9 % IV SOLN
750.0000 mg | Freq: Once | INTRAVENOUS | Status: AC
  Administered 2018-01-27: 750 mg via INTRAVENOUS
  Filled 2018-01-27: qty 15

## 2018-01-27 MED ORDER — SODIUM CHLORIDE 0.9 % IV SOLN
INTRAVENOUS | Status: DC | PRN
  Administered 2018-01-27: 250 mL via INTRAVENOUS

## 2018-01-27 NOTE — Progress Notes (Signed)
Diagnosis: Iron Deficiency Anemia    Provider: Dr. Irene Limbo   Procedure: IV Injectafer    Note: Patient received infusion of Injectafer. Patient tolerated infusion well with no adverse reaction. Monitored patient for 30 minutes post-infusion. Vital signs stable. Discharge instructions given. Patient alert, oriented and ambulatory at discharge.

## 2018-01-27 NOTE — Discharge Instructions (Signed)
Ferric carboxymaltose injection What is this medicine? FERRIC CARBOXYMALTOSE (ferr-ik car-box-ee-mol-toes) is an iron complex. Iron is used to make healthy red blood cells, which carry oxygen and nutrients throughout the body. This medicine is used to treat anemia in people with chronic kidney disease or people who cannot take iron by mouth. This medicine may be used for other purposes; ask your health care provider or pharmacist if you have questions. COMMON BRAND NAME(S): Injectafer What should I tell my health care provider before I take this medicine? They need to know if you have any of these conditions: -anemia not caused by low iron levels -high levels of iron in the blood -liver disease -an unusual or allergic reaction to iron, other medicines, foods, dyes, or preservatives -pregnant or trying to get pregnant -breast-feeding How should I use this medicine? This medicine is for infusion into a vein. It is given by a health care professional in a hospital or clinic setting. Talk to your pediatrician regarding the use of this medicine in children. Special care may be needed. Overdosage: If you think you have taken too much of this medicine contact a poison control center or emergency room at once. NOTE: This medicine is only for you. Do not share this medicine with others. What if I miss a dose? It is important not to miss your dose. Call your doctor or health care professional if you are unable to keep an appointment. What may interact with this medicine? Do not take this medicine with any of the following medications: -deferoxamine -dimercaprol -other iron products This medicine may also interact with the following medications: -chloramphenicol -deferasirox This list may not describe all possible interactions. Give your health care provider a list of all the medicines, herbs, non-prescription drugs, or dietary supplements you use. Also tell them if you smoke, drink alcohol, or use  illegal drugs. Some items may interact with your medicine. What should I watch for while using this medicine? Visit your doctor or health care professional regularly. Tell your doctor if your symptoms do not start to get better or if they get worse. You may need blood work done while you are taking this medicine. You may need to follow a special diet. Talk to your doctor. Foods that contain iron include: whole grains/cereals, dried fruits, beans, or peas, leafy green vegetables, and organ meats (liver, kidney). What side effects may I notice from receiving this medicine? Side effects that you should report to your doctor or health care professional as soon as possible: -allergic reactions like skin rash, itching or hives, swelling of the face, lips, or tongue -breathing problems -changes in blood pressure -feeling faint or lightheaded, falls -flushing, sweating, or hot feelings Side effects that usually do not require medical attention (report to your doctor or health care professional if they continue or are bothersome): -changes in taste -constipation -dizziness -headache -nausea -pain, redness, or irritation at site where injected -vomiting This list may not describe all possible side effects. Call your doctor for medical advice about side effects. You may report side effects to FDA at 1-800-FDA-1088. Where should I keep my medicine? This drug is given in a hospital or clinic and will not be stored at home. NOTE: This sheet is a summary. It may not cover all possible information. If you have questions about this medicine, talk to your doctor, pharmacist, or health care provider.  2018 Elsevier/Gold Standard (2015-04-19 11:20:47)  

## 2018-02-18 ENCOUNTER — Ambulatory Visit: Payer: PRIVATE HEALTH INSURANCE | Admitting: Pulmonary Disease

## 2018-03-18 ENCOUNTER — Encounter: Payer: Self-pay | Admitting: Hematology

## 2018-03-18 ENCOUNTER — Telehealth: Payer: Self-pay

## 2018-03-18 ENCOUNTER — Inpatient Hospital Stay: Payer: Managed Care, Other (non HMO) | Admitting: Hematology

## 2018-03-18 ENCOUNTER — Inpatient Hospital Stay: Payer: Managed Care, Other (non HMO) | Attending: Hematology

## 2018-03-18 DIAGNOSIS — D508 Other iron deficiency anemias: Secondary | ICD-10-CM | POA: Insufficient documentation

## 2018-03-18 DIAGNOSIS — D500 Iron deficiency anemia secondary to blood loss (chronic): Secondary | ICD-10-CM

## 2018-03-18 LAB — CBC WITH DIFFERENTIAL/PLATELET
ABS IMMATURE GRANULOCYTES: 0.01 10*3/uL (ref 0.00–0.07)
BASOS PCT: 1 %
Basophils Absolute: 0 10*3/uL (ref 0.0–0.1)
EOS PCT: 2 %
Eosinophils Absolute: 0.1 10*3/uL (ref 0.0–0.5)
HCT: 42.1 % (ref 36.0–46.0)
HEMOGLOBIN: 13.3 g/dL (ref 12.0–15.0)
Immature Granulocytes: 0 %
Lymphocytes Relative: 26 %
Lymphs Abs: 1.6 10*3/uL (ref 0.7–4.0)
MCH: 27.3 pg (ref 26.0–34.0)
MCHC: 31.6 g/dL (ref 30.0–36.0)
MCV: 86.4 fL (ref 80.0–100.0)
MONO ABS: 0.3 10*3/uL (ref 0.1–1.0)
MONOS PCT: 4 %
NEUTROS ABS: 4.3 10*3/uL (ref 1.7–7.7)
Neutrophils Relative %: 67 %
Platelets: 214 10*3/uL (ref 150–400)
RBC: 4.87 MIL/uL (ref 3.87–5.11)
RDW: 20.2 % — ABNORMAL HIGH (ref 11.5–15.5)
WBC: 6.3 10*3/uL (ref 4.0–10.5)
nRBC: 0 % (ref 0.0–0.2)

## 2018-03-18 LAB — FERRITIN: FERRITIN: 51 ng/mL (ref 11–307)

## 2018-03-18 LAB — IRON AND TIBC
IRON: 46 ug/dL (ref 41–142)
SATURATION RATIOS: 14 % — AB (ref 21–57)
TIBC: 330 ug/dL (ref 236–444)
UIBC: 284 ug/dL (ref 120–384)

## 2018-03-18 NOTE — Telephone Encounter (Signed)
Per patient request to reschedule due to another appointment. Per 12/19 los was unable to use the appointment time to r/s due to she was already arrived for appointment. She had labs on today also.

## 2018-03-23 ENCOUNTER — Telehealth: Payer: Self-pay

## 2018-03-23 NOTE — Telephone Encounter (Signed)
Per 12/19 los. Patient left

## 2018-03-24 NOTE — Progress Notes (Signed)
This encounter was created in error - please disregard.

## 2018-04-19 ENCOUNTER — Inpatient Hospital Stay: Payer: Managed Care, Other (non HMO) | Attending: Hematology | Admitting: Hematology

## 2018-05-11 ENCOUNTER — Encounter: Payer: Self-pay | Admitting: Internal Medicine

## 2018-06-07 ENCOUNTER — Ambulatory Visit: Payer: PRIVATE HEALTH INSURANCE | Admitting: Internal Medicine

## 2018-07-05 ENCOUNTER — Ambulatory Visit (INDEPENDENT_AMBULATORY_CARE_PROVIDER_SITE_OTHER): Payer: Managed Care, Other (non HMO) | Admitting: Internal Medicine

## 2018-07-05 ENCOUNTER — Encounter: Payer: Self-pay | Admitting: Internal Medicine

## 2018-07-05 ENCOUNTER — Other Ambulatory Visit: Payer: Self-pay

## 2018-07-05 DIAGNOSIS — D500 Iron deficiency anemia secondary to blood loss (chronic): Secondary | ICD-10-CM | POA: Diagnosis not present

## 2018-07-05 DIAGNOSIS — Z8371 Family history of colonic polyps: Secondary | ICD-10-CM | POA: Diagnosis not present

## 2018-07-05 DIAGNOSIS — Z83719 Family history of colon polyps, unspecified: Secondary | ICD-10-CM | POA: Insufficient documentation

## 2018-07-05 HISTORY — DX: Iron deficiency anemia secondary to blood loss (chronic): D50.0

## 2018-07-05 NOTE — Patient Instructions (Signed)
Good to visit with you today.  As we discussed please come to the lab in the basement of our building at [place]. after you receive this message through My Chart and have labs drawn.  We will contact you with results and plans.  My current thinking is you will need an elective colonoscopy later this year after the Coronavirus restrictions are lifted.  You should also see a gynecologist. We will try to check your insurance to see about referring you for menorrhagia - it seems to me this should be ok but you never know with insurance companies. It seems that the chronic heavy periods have been the cause of your long-standing anemia.  I appreciate the opportunity to care for you. [name], MD, [facility]

## 2018-07-05 NOTE — Assessment & Plan Note (Addendum)
Not necessarily a reason to do colonoscopy before 30 but she is African-American so routine screening reasonable at 34 and though the iron deficiency anemia seems likely due to menstrual loss since she has been anemic since her 20's that adds to reasons to start before 52  Will not schedule now due to Coronavirus/Covid-19 restrictions but will place on a list to schedule later this year Should be able to be done in [facility] and go through previsits

## 2018-07-05 NOTE — Assessment & Plan Note (Signed)
Recheck ferritin and CBC Likely will need to go back on oral iron Needs GYN evaluation - says her insurance does not allow her to see GYN - ? If that is only for PAP etc and routine care - a GYN visit for menorrhagia would be diagnostic and possibly therapeutic

## 2018-07-05 NOTE — Progress Notes (Signed)
    TELEHEALTH ENCOUNTER IN SETTING OF COVID-19 PANDEMIC - REQUESTED BY PATIENT SERVICE PROVIDED BY TELEMEDECINE - TYPE: Webex PATIENT LOCATION: Home PATIENT HAS CONSENTED TO TELEHEALTH VISIT PROVIDER LOCATION: OFFICE REFERRING PROVIDER:Dr. Calianna Kim 48 y.o. Feb 05, 1971 [id number]  Assessment & Plan:   Iron deficiency anemia due to chronic blood loss - menorrhagia suspected as cause Recheck ferritin and CBC Likely will need to go back on oral iron Needs GYN evaluation - says her insurance does not allow her to see GYN - ? If that is only for PAP etc and routine care - a GYN visit for menorrhagia would be diagnostic and possibly therapeutic  Family history of colonic polyps - brother 27's and father 76's Not necessarily a reason to do colonoscopy before 58 but she is African-American so routine screening reasonable at 58 and though the iron deficiency anemia seems likely due to menstrual loss since she has been anemic since her 30's that adds to reasons to start before 7  Will not schedule now due to Coronavirus/Covid-19 restrictions but will place on a list to schedule later this year Should be able to be done in [facility] and go through previsits   I appreciate the opportunity to care for her. VV:[id number], Maebelle Munroe, MD   Subjective:   Chief Complaint: family history of colon polyps  HPI 48 yo AA woman with referral to be considered for colonoscopy due to brother (48's) and father (85's) having colon polyps that we believe were adenomatous/pre-cancerous. She does not have any GI bleeding, change in bowels, abdominal pain or any GI symptoms. Does have a hx iron-deficiency anemia that is attributed to menorrhagia and has been a problem since her 20's. Saw Dr. Irene Limbo of hematology last year - had 2 feraheme infusions and Hgb went from 9 to 13 and ferritin from<4 to 51 as of 03/12/2018. She felt better - less fatigue, dyspnea and stopped oral iron and has not been  reassessed. She was supposed to see GYN but was told her insurance would not allow her to see GYN.  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 Also had neg CT/MRCP. No Known Allergies Current Meds  Medication Sig  . clonazePAM (KLONOPIN) 1 MG tablet Take 1 mg by mouth at bedtime.  . fluticasone (FLONASE) 50 MCG/ACT nasal spray Place 1 spray into both nostrils daily.   Past Medical History:  Diagnosis Date  . Allergic rhinitis   . Chronic epigastric pain    recurrent discrete episodes  . Iron deficiency anemia   . Iron deficiency anemia due to chronic blood loss 07/05/2018  . 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 Restless leg syndrome    Past Surgical History:  Procedure Laterality Date  . CHOLECYSTECTOMY  2012  . TUBAL LIGATION    . UPPER GASTROINTESTINAL ENDOSCOPY  11/25/2010   NORMAL   Social History   Social History Narrative   Separated - living with her mom   2 adult kids   Work - self-employed - crafts   Non-smoker, rare EtOH, no drugs   family history includes Colon polyps in her brother and father; Diabetes in her maternal grandmother; Heart attack in her brother; Hypertension in her father; Pancreatic cancer in her maternal aunt.   Review of Systems Panic attacks at times, eyeglasses, all other ROS negative

## 2018-07-08 ENCOUNTER — Telehealth: Payer: Self-pay | Admitting: Internal Medicine

## 2018-07-08 NOTE — Telephone Encounter (Signed)
Patient states Dr.Gessner told her during appt on 07/05/18 that he wanted her to get lab work done. Patient wants to know if and when she needs to do this.

## 2018-07-12 NOTE — Telephone Encounter (Signed)
I spoke with [place] and told her the lab days/hours to come and get her lab work drawn. Orders are in epic.

## 2018-07-13 ENCOUNTER — Other Ambulatory Visit (INDEPENDENT_AMBULATORY_CARE_PROVIDER_SITE_OTHER): Payer: Managed Care, Other (non HMO)

## 2018-07-13 DIAGNOSIS — D500 Iron deficiency anemia secondary to blood loss (chronic): Secondary | ICD-10-CM

## 2018-07-13 LAB — CBC WITH DIFFERENTIAL/PLATELET
Basophils Absolute: 0 10*3/uL (ref 0.0–0.1)
Basophils Relative: 0.4 % (ref 0.0–3.0)
Eosinophils Absolute: 0 10*3/uL (ref 0.0–0.7)
Eosinophils Relative: 0.9 % (ref 0.0–5.0)
HCT: 39.1 % (ref 36.0–46.0)
Hemoglobin: 12.9 g/dL (ref 12.0–15.0)
Lymphocytes Relative: 28.2 % (ref 12.0–46.0)
Lymphs Abs: 1.5 10*3/uL (ref 0.7–4.0)
MCHC: 33 g/dL (ref 30.0–36.0)
MCV: 86.2 fl (ref 78.0–100.0)
Monocytes Absolute: 0.2 10*3/uL (ref 0.1–1.0)
Monocytes Relative: 4.3 % (ref 3.0–12.0)
Neutro Abs: 3.6 10*3/uL (ref 1.4–7.7)
Neutrophils Relative %: 66.2 % (ref 43.0–77.0)
Platelets: 260 10*3/uL (ref 150.0–400.0)
RBC: 4.53 Mil/uL (ref 3.87–5.11)
RDW: 13.8 % (ref 11.5–15.5)
WBC: 5.4 10*3/uL (ref 4.0–10.5)

## 2018-07-13 LAB — FERRITIN: Ferritin: 5.5 ng/mL — ABNORMAL LOW (ref 10.0–291.0)

## 2018-07-14 NOTE — Progress Notes (Signed)
Victoria Bender,  Blood count is good but iron is low so without iron supplements you will get anemic (low hemoglobin) again. So starty ferrous sulfate 325 mg every day - can get over the counter.  Double check about seeing a gynecologist regarding heavy periods - I find it strange that you cannot see a gynecologist for problems like that. Maybe can't for PAP smear and routine check up but you have a problem (menorrhagia and blood loss anemia) that needs evaluation.  I have you on a list to contact when virrus restrictions end.  [name], MD, [facility]

## 2018-07-28 ENCOUNTER — Emergency Department (HOSPITAL_COMMUNITY)
Admission: EM | Admit: 2018-07-28 | Discharge: 2018-07-29 | Disposition: A | Discharge status: Home / Self Care | Payer: Managed Care, Other (non HMO) | Attending: Emergency Medicine | Admitting: Emergency Medicine

## 2018-07-28 ENCOUNTER — Other Ambulatory Visit: Payer: Self-pay

## 2018-07-28 ENCOUNTER — Emergency Department (HOSPITAL_COMMUNITY): Payer: Managed Care, Other (non HMO)

## 2018-07-28 DIAGNOSIS — Z79899 Other long term (current) drug therapy: Secondary | ICD-10-CM | POA: Diagnosis not present

## 2018-07-28 DIAGNOSIS — F410 Panic disorder [episodic paroxysmal anxiety] without agoraphobia: Secondary | ICD-10-CM | POA: Diagnosis not present

## 2018-07-28 DIAGNOSIS — R079 Chest pain, unspecified: Secondary | ICD-10-CM | POA: Insufficient documentation

## 2018-07-28 MED ORDER — SODIUM CHLORIDE 0.9% FLUSH: 3.0000 mL | Freq: Once | INTRAVENOUS | Status: DC

## 2018-07-28 NOTE — ED Triage Notes (Signed)
Per ems pt is at home with teenage daughter and has been having some issues with her today bc of the quarantine and she has been having bad anxiety today with her and hyperventilating. Pt said she was stressing so bad it caused her chest under her left breast discomfort. Pt is alert oriented x 4. No SOB NO DISTRESS NOTED

## 2018-07-29 LAB — I-STAT BETA HCG BLOOD, ED (MC, WL, AP ONLY): I-stat hCG, quantitative: <5 m[IU]/mL (ref <5)

## 2018-07-29 LAB — BASIC METABOLIC PANEL
Anion gap: 9 (ref 5–15)
BUN: 8 mg/dL (ref 6–20)
CO2: 20 mmol/L — ABNORMAL LOW (ref 22–32)
Calcium: 9.4 mg/dL (ref 8.9–10.3)
Chloride: 108 mmol/L (ref 98–111)
Creatinine, Ser: 1.04 mg/dL — ABNORMAL HIGH (ref 0.44–1.00)
GFR calc Af Amer: >60 mL/min (ref >60)
GFR calc non Af Amer: >60 mL/min (ref >60)
Glucose, Bld: 109 mg/dL — ABNORMAL HIGH (ref 70–99)
Potassium: 4.3 mmol/L (ref 3.5–5.1)
Sodium: 137 mmol/L (ref 135–145)

## 2018-07-29 LAB — TROPONIN I
Troponin I: <0.03 ng/mL (ref <0.03)
Troponin I: <0.03 ng/mL (ref <0.03)

## 2018-07-29 LAB — CBC
HCT: 39.7 % (ref 36.0–46.0)
Hemoglobin: 12.5 g/dL (ref 12.0–15.0)
MCH: 27.6 pg (ref 26.0–34.0)
MCHC: 31.5 g/dL (ref 30.0–36.0)
MCV: 87.6 fL (ref 80.0–100.0)
Platelets: 289 10*3/uL (ref 150–400)
RBC: 4.53 MIL/uL (ref 3.87–5.11)
RDW: 12.9 % (ref 11.5–15.5)
WBC: 8.2 10*3/uL (ref 4.0–10.5)
nRBC: 0 % (ref 0.0–0.2)

## 2018-07-29 NOTE — ED Notes (Signed)
Blood specimen for Troponin recollected and sent to lab .

## 2018-07-29 NOTE — ED Provider Notes (Signed)
[facility] EMERGENCY DEPARTMENT Provider Note   CSN: [id number] Arrival date & time: 07/28/18  2323    History   Chief Complaint Chief Complaint  Patient presents with  . Chest Pain    HPI Victoria Bender is a 48 y.o. female.     Patient presents to the emergency department with a chief complaint of chest pain.  She states that she had some chest pain this evening.  She reports that it caused her to have a panic attack as well.  She has had some shortness of breath symptoms.  She believes that her symptoms have been aggravated due to contention in the home from a family member, who does not agree with the quarantine guidelines.  Patient states that she had to take some aspirin and Xanax.  She feels improved, but still has some shortness of breath symptoms.  She denies any fever, chills, or cough.  She denies any other associated symptoms now.  States that she is feeling much better now after having taken her medication.  The history is provided by the patient. No language interpreter was used.    Past Medical History:  Diagnosis Date  . Allergic rhinitis   . Chronic epigastric pain    recurrent discrete episodes  . Iron deficiency anemia   . Iron deficiency anemia due to chronic blood loss 07/05/2018  . 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 Restless leg syndrome     Patient Active Problem List   Diagnosis Date Noted  . Iron deficiency anemia due to chronic blood loss - menorrhagia suspected as cause 07/05/2018  . Family history of colonic polyps - brother 76's and father 63's 07/05/2018    Past Surgical History:  Procedure Laterality Date  . CHOLECYSTECTOMY  2012  . TUBAL LIGATION    . UPPER GASTROINTESTINAL ENDOSCOPY  11/25/2010   NORMAL     OB History    Gravida  3   Para  2   Term  2   Preterm      AB  1   Living  2     SAB  1   TAB      Ectopic      Multiple      Live Births               Home Medications     Prior to Admission medications   Medication Sig Start Date End Date Taking? Authorizing Provider  clonazePAM (KLONOPIN) 1 MG tablet Take 1 mg by mouth at bedtime.    [provider]  fluticasone (FLONASE) 50 MCG/ACT nasal spray Place 1 spray into both nostrils daily.    [provider]    Family History Family History  Problem Relation Age of Onset  . Hypertension Father   . Colon polyps Father   . Heart attack Brother   . Colon polyps Brother        adenomatous polyps  . Pancreatic cancer Maternal Aunt   . Diabetes Maternal Grandmother   . Colon cancer Neg Hx     Social History Social History   Tobacco Use  . Smoking status: Never Smoker  . Smokeless tobacco: Never Used  Substance Use Topics  . Alcohol use: No    Comment: social  . Drug use: No     Allergies   Patient has no known allergies.   Review of Systems Review of Systems  All other systems reviewed and are negative.  Physical Exam Updated Vital Signs BP 106/73 (BP Location: Left Arm)   Pulse 85   Temp 98 F (36.7 C) (Oral)   Resp 16   SpO2 100%   Physical Exam Vitals signs and nursing note reviewed.  Constitutional:      General: She is not in acute distress.    Appearance: She is well-developed.  HENT:     Head: Normocephalic and atraumatic.  Eyes:     Conjunctiva/sclera: Conjunctivae normal.  Neck:     Musculoskeletal: Neck supple.  Cardiovascular:     Rate and Rhythm: Normal rate and regular rhythm.     Heart sounds: No murmur.  Pulmonary:     Effort: Pulmonary effort is normal. No respiratory distress.     Breath sounds: Normal breath sounds.  Abdominal:     Palpations: Abdomen is soft.     Tenderness: There is no abdominal tenderness.  Skin:    General: Skin is warm and dry.  Neurological:     Mental Status: She is alert and oriented to person, place, and time.  Psychiatric:        Mood and Affect: Mood normal.        Behavior: Behavior normal.       ED Treatments / Results  Labs (all labs ordered are listed, but only abnormal results are displayed) Labs Reviewed  BASIC METABOLIC PANEL - Abnormal; Notable for the following components:      Result Value   CO2 20 (*)    Glucose, Bld 109 (*)    Creatinine, Ser 1.04 (*)    All other components within normal limits  CBC  TROPONIN I  I-STAT BETA HCG BLOOD, ED ([facility], [facility], [facility] ONLY)    EKG None  Radiology Dg Chest 2 View  Result Date: 07/28/2018 CLINICAL DATA:  48 year old female with left side chest pain onset 1930 hours. Shortness of breath. EXAM: CHEST - 2 VIEW COMPARISON:  12/13/2017 and earlier. FINDINGS: Stable lung volumes and mediastinal contours. Chronic increased pulmonary interstitial markings are stable since at least 2018. No pneumothorax, pleural effusion or acute pulmonary opacity. Visualized tracheal air column is within normal limits. Negative visible bowel gas pattern. No osseous abnormality identified. IMPRESSION: No acute cardiopulmonary abnormality. Electronically Signed   By: Genevie Ann M.D.   On: 07/28/2018 23:59    Procedures Procedures (including critical care time)  Medications Ordered in ED Medications  sodium chloride flush (NS) 0.9 % injection 3 mL (has no administration in time range)     Initial Impression / Assessment and Plan / ED Course  I have reviewed the triage vital signs and the nursing notes.  Pertinent labs & imaging results that were available during my care of the patient were reviewed by me and considered in my medical decision making (see chart for details).        Patient with chest pain and shortness of breath.  Symptoms started today.  She has been having contention at home over the quarantine, and believes that this is exacerbated her anxiety.  She also states that she had a panic attack after having had the chest pain.  She feels improved now after having taken some Xanax.  EKG shows no ischemic changes.  No concerning arrhythmias.   Her heart rate is normal, her oxygen level is normal, I doubt PE.  She is low risk for ACS, her heart score is a 1.  I doubt COVID-19, as the patient has not had fever, or chills, or cough.  Will check repeat troponin, plan for discharge if normal.  Delta troponin negative.  Delay in test results due to power outage and tube system malfunction.  Stable for outpatient follow-up.  Final Clinical Impressions(s) / ED Diagnoses   Final diagnoses:  Nonspecific chest pain  Panic attack    ED Discharge Orders    None       Montine Circle, PA-C 07/29/18 0630    Ward, Delice Bison, DO 07/29/18 [phone]

## 2018-07-29 NOTE — ED Notes (Signed)
Lab called by nurse to follow up result of pt.'s Troponin collected at 3 am ,  Lab tech advised RN that they did not receive the specimen , facilities notified by secretary to locate pt.'s specimen at tube system  , PA notified .

## 2018-08-13 ENCOUNTER — Encounter: Payer: Self-pay | Admitting: Internal Medicine

## 2018-08-30 ENCOUNTER — Other Ambulatory Visit: Payer: Self-pay

## 2018-08-30 ENCOUNTER — Ambulatory Visit (AMBULATORY_SURGERY_CENTER): Payer: Self-pay

## 2018-08-30 ENCOUNTER — Encounter: Payer: Self-pay | Admitting: Internal Medicine

## 2018-08-30 VITALS — Ht 65.0 in | Wt 230.0 lb

## 2018-08-30 DIAGNOSIS — D500 Iron deficiency anemia secondary to blood loss (chronic): Secondary | ICD-10-CM

## 2018-08-30 NOTE — Progress Notes (Signed)
Denies allergies to eggs or soy products. Denies complication of anesthesia or sedation. Denies use of weight loss medication. Denies use of O2.   Emmi instructions given for colonoscopy.  Pre-Visit was conducted by phone due to Covid 19. Instructions were reviewed with patient and mailed to her confirmed home address. Patient was encouraged to call if she had any questions or concerns.

## 2018-09-10 ENCOUNTER — Telehealth: Payer: Self-pay

## 2018-09-10 NOTE — Telephone Encounter (Signed)
Covid-19 screening questions   Do you now or have you had a fever in the last 14 days? No  Do you have any respiratory symptoms of shortness of breath or cough now or in the last 14 days? No  Do you have any family members or close contacts with diagnosed or suspected Covid-19 in the past 14 days? No  Have you been tested for Covid-19 and found to be positive? No        

## 2018-09-13 ENCOUNTER — Ambulatory Visit (AMBULATORY_SURGERY_CENTER): Payer: Managed Care, Other (non HMO) | Admitting: Internal Medicine

## 2018-09-13 ENCOUNTER — Encounter: Payer: Self-pay | Admitting: Internal Medicine

## 2018-09-13 ENCOUNTER — Other Ambulatory Visit: Payer: Self-pay

## 2018-09-13 VITALS — BP 105/76 | HR 74 | Temp 98.5°F | Resp 13 | Ht 65.0 in | Wt 230.0 lb

## 2018-09-13 DIAGNOSIS — D500 Iron deficiency anemia secondary to blood loss (chronic): Secondary | ICD-10-CM

## 2018-09-13 DIAGNOSIS — K5730 Diverticulosis of large intestine without perforation or abscess without bleeding: Secondary | ICD-10-CM | POA: Diagnosis not present

## 2018-09-13 DIAGNOSIS — D123 Benign neoplasm of transverse colon: Secondary | ICD-10-CM | POA: Diagnosis not present

## 2018-09-13 MED ORDER — SODIUM CHLORIDE 0.9 % IV SOLN: 500.0000 mL | Freq: Once | INTRAVENOUS | Status: DC

## 2018-09-13 NOTE — Progress Notes (Signed)
Pt awake. VSS. Report given to RN. No anesthetic complications noted 

## 2018-09-13 NOTE — Op Note (Signed)
[facility] Patient Name: Victoria Bender Procedure Date: 09/13/2018 11:16 AM MRN: [id number] Endoscopist: [name] , MD Age: 48 Referring MD:  Date of Birth: January 04, 1971 Gender: Female Account #: [id number] Procedure:                Colonoscopy Indications:              Iron deficiency anemia secondary to chronic blood                            loss Medicines:                Propofol per Anesthesia, Monitored Anesthesia Care Procedure:                Pre-Anesthesia Assessment:                           - Prior to the procedure, a History and Physical                            was performed, and patient medications and                            allergies were reviewed. The patient's tolerance of                            previous anesthesia was also reviewed. The risks                            and benefits of the procedure and the sedation                            options and risks were discussed with the patient.                            All questions were answered, and informed consent                            was obtained. Prior Anticoagulants: The patient has                            taken no previous anticoagulant or antiplatelet                            agents. ASA Grade Assessment: II - A patient with                            mild systemic disease. After reviewing the risks                            and benefits, the patient was deemed in                            satisfactory condition to undergo the procedure.  After obtaining informed consent, the colonoscope                            was passed under direct vision. Throughout the                            procedure, the patient's blood pressure, pulse, and                            oxygen saturations were monitored continuously. The                            Colonoscope was introduced through the anus and                            advanced to the the cecum,  identified by                            appendiceal orifice and ileocecal valve. The                            colonoscopy was performed without difficulty. The                            patient tolerated the procedure well. The quality                            of the bowel preparation was good. The bowel                            preparation used was Miralax via split dose                            instruction. The ileocecal valve, appendiceal                            orifice, and rectum were photographed. Scope In: 11:27:58 AM Scope Out: 11:42:41 AM Scope Withdrawal Time: 0 hours 11 minutes 25 seconds  Total Procedure Duration: 0 hours 14 minutes 43 seconds  Findings:                 The perianal and digital rectal examinations were                            normal.                           A 1 to 2 mm polyp was found in the distal                            transverse colon. The polyp was sessile. The polyp                            was removed with a cold biopsy forceps. Resection  and retrieval were complete. Verification of                            patient identification for the specimen was done.                            Estimated blood loss was minimal.                           Multiple diverticula were found in the sigmoid                            colon.                           The exam was otherwise without abnormality on                            direct and retroflexion views. Complications:            No immediate complications. Estimated Blood Loss:     Estimated blood loss was minimal. Impression:               - One 1 to 2 mm polyp in the distal transverse                            colon, removed with a cold biopsy forceps. Resected                            and retrieved.                           - Diverticulosis in the sigmoid colon.                           - The examination was otherwise normal on direct                             and retroflexion views. Recommendation:           - Patient has a contact number available for                            emergencies. The signs and symptoms of potential                            delayed complications were discussed with the                            patient. Return to normal activities tomorrow.                            Written discharge instructions were provided to the                            patient.                           -  Resume previous diet.                           - Continue present medications.                           - Repeat colonoscopy is recommended. The                            colonoscopy date will be determined after pathology                            results from today's exam become available for                            review.                           - I will ask about her coverage for GYN evaluation                            - needs to see one for menorrhagia but says she is                            not able to wkt her insurance. ? deductible issue                            vs other? [name], MD 09/13/2018 11:49:27 AM This report has been signed electronically.

## 2018-09-13 NOTE — Progress Notes (Signed)
Pt's states no medical or surgical changes since previsit or office visit. 

## 2018-09-13 NOTE — Progress Notes (Signed)
Called to room to assist during endoscopic procedure.  Patient ID and intended procedure confirmed with present staff. Received instructions for my participation in the procedure from the performing physician.  

## 2018-09-13 NOTE — Patient Instructions (Addendum)
I removed a very tiny polyp.  I will let you know pathology results and when to have another routine colonoscopy by mail and/or My Chart.  I will look into the insurance and the GYN referral.  I appreciate the opportunity to care for you. [name], MD, [facility]  Polyp (handout given) Diverticulosis (handout given)  YOU HAD AN ENDOSCOPIC PROCEDURE TODAY AT [facility]:   Refer to the procedure report that was given to you for any specific questions about what was found during the examination.  If the procedure report does not answer your questions, please call your gastroenterologist to clarify.  If you requested that your care partner not be given the details of your procedure findings, then the procedure report has been included in a sealed envelope for you to review at your convenience later.  YOU SHOULD EXPECT: Some feelings of bloating in the abdomen. Passage of more gas than usual.  Walking can help get rid of the air that was put into your GI tract during the procedure and reduce the bloating. If you had a lower endoscopy (such as a colonoscopy or flexible sigmoidoscopy) you may notice spotting of blood in your stool or on the toilet paper. If you underwent a bowel prep for your procedure, you may not have a normal bowel movement for a few days.  Please Note:  You might notice some irritation and congestion in your nose or some drainage.  This is from the oxygen used during your procedure.  There is no need for concern and it should clear up in a day or so.  SYMPTOMS TO REPORT IMMEDIATELY:   Following lower endoscopy (colonoscopy or flexible sigmoidoscopy):  Excessive amounts of blood in the stool  Significant tenderness or worsening of abdominal pains  Swelling of the abdomen that is new, acute  Fever of 100F or higher   Following upper endoscopy (EGD)  Vomiting of blood or coffee ground material  New chest pain or pain under the shoulder blades  Painful or persistently difficult swallowing  New shortness of breath  Fever of 100F or higher  Black, tarry-looking stools  For urgent or emergent issues, a gastroenterologist can be reached at any hour by calling [phone].   DIET:  We do recommend a small meal at first, but then you may proceed to your regular diet.  Drink plenty of fluids but you should avoid alcoholic beverages for 24 hours.  ACTIVITY:  You should plan to take it easy for the rest of today and you should NOT DRIVE or use heavy machinery until tomorrow (because of the sedation medicines used during the test).    FOLLOW UP: Our staff will call the number listed on your records 48-72 hours following your procedure to check on you and address any questions or concerns that you may have regarding the information given to you following your procedure. If we do not reach you, we will leave a message.  We will attempt to reach you two times.  During this call, we will ask if you have developed any symptoms of COVID 19. If you develop any symptoms (ie: fever, flu-like symptoms, shortness of breath, cough etc.) before then, please call [phone].  If you test positive for Covid 19 in the 2 weeks post procedure, please call and report this information to [place].    If any biopsies were taken you will be contacted by phone or by letter within the next 1-3 weeks.  Please  call us at [phone] if you have not heard about the biopsies in 3 weeks.    SIGNATURES/CONFIDENTIALITY: You and/or your care partner have signed paperwork which will be entered into your electronic medical record.  These signatures attest to the fact that that the information above on your After Visit Summary has been reviewed and is understood.  Full responsibility of the confidentiality of this discharge information lies with you and/or your care-partner.

## 2018-09-15 ENCOUNTER — Telehealth: Payer: Self-pay | Admitting: *Deleted

## 2018-09-15 NOTE — Telephone Encounter (Signed)
  Follow up Call-  Call back number 09/13/2018  Post procedure Call Back phone  # [phone]  Permission to leave phone message Yes  Some recent data might be hidden     Patient questions:  Do you have a fever, pain , or abdominal swelling? No. Pain Score  0 *  Have you tolerated food without any problems? Yes.    Have you been able to return to your normal activities? Yes.    Do you have any questions about your discharge instructions: Diet   No. Medications  No. Follow up visit  No.  Do you have questions or concerns about your Care? No.  Actions: * If pain score is 4 or above: No action needed, pain <4.  1. Have you developed a fever since your procedure? No  2.   Have you had an respiratory symptoms (SOB or cough) since your procedure? No  3.   Have you tested positive for COVID 19 since your procedure No  4.   Have you had any family members/close contacts diagnosed with the COVID 19 since your procedure?  No   If yes to any of these questions please route to Joylene John, RN and Alphonsa Gin, RN.

## 2018-09-21 ENCOUNTER — Encounter: Payer: Self-pay | Admitting: Internal Medicine

## 2018-09-21 NOTE — Progress Notes (Signed)
1-2 mm adenoma Recall 2027 My Chart  Note - insurance she has does not cover procedures, consultants well - barrier to GYN eval and Tx

## 2018-09-21 NOTE — Progress Notes (Signed)
Correction colon recall 2025 due to FHx polyps also

## 2020-04-20 ENCOUNTER — Encounter (HOSPITAL_COMMUNITY): Payer: Self-pay | Admitting: Emergency Medicine

## 2020-04-20 ENCOUNTER — Emergency Department (HOSPITAL_COMMUNITY)
Admission: EM | Admit: 2020-04-20 | Discharge: 2020-04-20 | Disposition: A | Discharge status: Home / Self Care | Payer: BLUE CROSS/BLUE SHIELD | Attending: Emergency Medicine | Admitting: Emergency Medicine

## 2020-04-20 ENCOUNTER — Other Ambulatory Visit: Payer: Self-pay

## 2020-04-20 ENCOUNTER — Emergency Department (HOSPITAL_COMMUNITY): Payer: BLUE CROSS/BLUE SHIELD

## 2020-04-20 DIAGNOSIS — R079 Chest pain, unspecified: Secondary | ICD-10-CM

## 2020-04-20 DIAGNOSIS — R0789 Other chest pain: Secondary | ICD-10-CM | POA: Insufficient documentation

## 2020-04-20 DIAGNOSIS — R202 Paresthesia of skin: Secondary | ICD-10-CM | POA: Diagnosis not present

## 2020-04-20 LAB — BASIC METABOLIC PANEL
Anion gap: 9 (ref 5–15)
BUN: 7 mg/dL (ref 6–20)
CO2: 23 mmol/L (ref 22–32)
Calcium: 9 mg/dL (ref 8.9–10.3)
Chloride: 107 mmol/L (ref 98–111)
Creatinine, Ser: 0.89 mg/dL (ref 0.44–1.00)
GFR, Estimated: >60 mL/min (ref >60)
Glucose, Bld: 101 mg/dL — ABNORMAL HIGH (ref 70–99)
Potassium: 3.9 mmol/L (ref 3.5–5.1)
Sodium: 139 mmol/L (ref 135–145)

## 2020-04-20 LAB — CBC
HCT: 34.2 % — ABNORMAL LOW (ref 36.0–46.0)
Hemoglobin: 10.3 g/dL — ABNORMAL LOW (ref 12.0–15.0)
MCH: 25.2 pg — ABNORMAL LOW (ref 26.0–34.0)
MCHC: 30.1 g/dL (ref 30.0–36.0)
MCV: 83.6 fL (ref 80.0–100.0)
Platelets: 294 10*3/uL (ref 150–400)
RBC: 4.09 MIL/uL (ref 3.87–5.11)
RDW: 15.1 % (ref 11.5–15.5)
WBC: 8.6 10*3/uL (ref 4.0–10.5)
nRBC: 0 % (ref 0.0–0.2)

## 2020-04-20 LAB — TROPONIN I (HIGH SENSITIVITY)
Troponin I (High Sensitivity): 3 ng/L (ref <18)
Troponin I (High Sensitivity): 3 ng/L (ref <18)

## 2020-04-20 LAB — I-STAT BETA HCG BLOOD, ED (MC, WL, AP ONLY): I-stat hCG, quantitative: <5 m[IU]/mL (ref <5)

## 2020-04-20 NOTE — Discharge Instructions (Signed)
Please read instructions below. Follow up with your primary care provider.  Return to the ER for new or worsening symptoms; including worsening chest pain, shortness of breath, pain that radiates to the arm or neck, pain or shortness of breath worsened with exertion.   

## 2020-04-20 NOTE — ED Triage Notes (Signed)
Patient here with complaint of chest tightness that started at 1500 today. Denies cardiac history.

## 2020-04-20 NOTE — ED Provider Notes (Signed)
[facility] EMERGENCY DEPARTMENT Provider Note   CSN: [id number] Arrival date & time: 04/20/20  1726     History Chief Complaint  Patient presents with  . Chest Pain    Victoria Bender is a 50 y.o. female w PMHx iron deficiency anemia, panic attach, presenting to the ED with complaint of chest tightness that began today at 2 PM.  She states she began having some fluttering sensation in her chest earlier in the morning, however while she was sitting at her desk today around 2 PM she began feeling it tight tightness in her central chest.  She began feeling some tingling sensation in her left arm as well.  Symptoms subsided after about 30 minutes.  She was at rest when symptoms began.  She had no associated nausea, diaphoresis, or shortness of breath.  She still feels a little bit of tingling in her left arm though otherwise symptoms are improved.    No cardiac history or or first-degree relatives with cardiac disease before the age of 58.  Denies history of hypertension.,  Hyperlipidemia, diabetes, smoking.  No history of PE or DVT, no recent trauma or immobilization, no exogenous estrogen use.  No recent illness or any active URI symptoms  The history is provided by the patient.    HPI: A 50 year old patient with a history of obesity presents for evaluation of chest pain. Initial onset of pain was more than 6 hours ago. The patient's chest pain is described as heaviness/pressure/tightness and is not worse with exertion. The patient's chest pain is middle- or left-sided, is not well-localized, is not sharp and does radiate to the arms/jaw/neck. The patient does not complain of nausea and denies diaphoresis. The patient has no history of stroke, has no history of peripheral artery disease, has not smoked in the past 90 days, denies any history of treated diabetes, has no relevant family history of coronary artery disease (first degree relative at less than age 74), is not  hypertensive and has no history of hypercholesterolemia.   Past Medical History:  Diagnosis Date  . Allergic rhinitis   . Chronic epigastric pain    recurrent discrete episodes  . Iron deficiency anemia   . Iron deficiency anemia due to chronic blood loss 07/05/2018  . 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 Restless leg syndrome     Patient Active Problem List   Diagnosis Date Noted  . Iron deficiency anemia due to chronic blood loss - menorrhagia suspected as cause 07/05/2018  . Family history of colonic polyps - brother 2's and father 23's 07/05/2018    Past Surgical History:  Procedure Laterality Date  . CHOLECYSTECTOMY  2012  . TUBAL LIGATION    . UPPER GASTROINTESTINAL ENDOSCOPY  11/25/2010   NORMAL     OB History    Gravida  3   Para  2   Term  2   Preterm      AB  1   Living  2     SAB  1   IAB      Ectopic      Multiple      Live Births              Family History  Problem Relation Age of Onset  . Hypertension Father   . Colon polyps Father   . Heart attack Brother   . Colon polyps Brother        adenomatous polyps  . Pancreatic  cancer Maternal Aunt   . Diabetes Maternal Grandmother   . Colon cancer Neg Hx   . Esophageal cancer Neg Hx   . Stomach cancer Neg Hx   . Rectal cancer Neg Hx     Social History   Tobacco Use  . Smoking status: Never Smoker  . Smokeless tobacco: Never Used  Vaping Use  . Vaping Use: Never used  Substance Use Topics  . Alcohol use: No    Comment: social  . Drug use: No    Home Medications Prior to Admission medications   Medication Sig Start Date End Date Taking? Authorizing Provider  clonazePAM (KLONOPIN) 1 MG tablet Take 1 mg by mouth at bedtime.    [provider]  ibuprofen (ADVIL) 200 MG tablet Take 200 mg by mouth every 6 (six) hours as needed for fever or mild pain.    [provider]  polyethylene glycol powder (GLYCOLAX/MIRALAX) 17 GM/SCOOP powder Take 1 Container by mouth  once.    [provider]  Prenatal Vit-Fe Fumarate-FA (PRENATAL MULTIVITAMIN) TABS tablet Take 1 tablet by mouth daily at 12 noon.    [provider]    Allergies    Patient has no known allergies.  Review of Systems   Review of Systems  All other systems reviewed and are negative.   Physical Exam Updated Vital Signs BP 115/82   Pulse 81   Temp 99.1 F (37.3 C) (Oral)   Resp 19   Ht 5\' 5"  (1.651 m)   Wt 108 kg   SpO2 100%   BMI 39.61 kg/m   Physical Exam Vitals and nursing note reviewed.  Constitutional:      General: She is not in acute distress.    Appearance: She is well-developed and well-nourished. She is not ill-appearing.  HENT:     Head: Normocephalic and atraumatic.  Eyes:     Conjunctiva/sclera: Conjunctivae normal.  Cardiovascular:     Rate and Rhythm: Normal rate and regular rhythm.     Heart sounds: Normal heart sounds.     Comments: Strong and equal radial pulses and DP pulses bilaterally Pulmonary:     Effort: Pulmonary effort is normal. No respiratory distress.     Breath sounds: Normal breath sounds.     Comments: Left-sided anterior chest wall tenderness to the pectoral region extending into the deltoid region Abdominal:     General: Bowel sounds are normal.     Palpations: Abdomen is soft.     Tenderness: There is no abdominal tenderness. There is no guarding or rebound.  Musculoskeletal:     Right lower leg: No edema.     Left lower leg: No edema.  Skin:    General: Skin is warm.  Neurological:     Mental Status: She is alert.  Psychiatric:        Mood and Affect: Mood and affect normal.        Behavior: Behavior normal.     ED Results / Procedures / Treatments   Labs (all labs ordered are listed, but only abnormal results are displayed) Labs Reviewed  BASIC METABOLIC PANEL - Abnormal; Notable for the following components:      Result Value   Glucose, Bld 101 (*)    All other components within normal limits  CBC  - Abnormal; Notable for the following components:   Hemoglobin 10.3 (*)    HCT 34.2 (*)    MCH 25.2 (*)    All other components within normal  limits  I-STAT BETA HCG BLOOD, ED ([facility], [facility], [facility] ONLY)  TROPONIN I (HIGH SENSITIVITY)  TROPONIN I (HIGH SENSITIVITY)    EKG EKG Interpretation  Date/Time:  Friday April 20 2020 20:27:39 EST Ventricular Rate:  84 PR Interval:    QRS Duration: 81 QT Interval:  337 QTC Calculation: 399 R Axis:   40 Text Interpretation: Sinus rhythm Low voltage, precordial leads Abnormal R-wave progression, early transition Baseline wander in lead(s) V3 V4 Confirmed by Quintella Reichert [phone]) on 04/20/2020 8:37:30 PM   Radiology DG Chest 2 View  Result Date: 04/20/2020 CLINICAL DATA:  Chest tightness since 3 p.m. EXAM: CHEST - 2 VIEW COMPARISON:  07/28/2018 FINDINGS: The heart size and mediastinal contours are within normal limits. Both lungs are clear. The visualized skeletal structures are unremarkable. IMPRESSION: No active cardiopulmonary disease. Electronically Signed   By: Randa Ngo M.D.   On: 04/20/2020 17:53    Procedures Procedures (including critical care time)  Medications Ordered in ED Medications - No data to display  ED Course  I have reviewed the triage vital signs and the nursing notes.  Pertinent labs & imaging results that were available during my care of the patient were reviewed by me and considered in my medical decision making (see chart for details).    MDM Rules/Calculators/A&P HEAR Score: 3                       Pt presenting with chest tightness that began at rest with some left arm tingling that occurred earlier this afternoon.  Does have history of panic attack and feels similar to prior panic attacks though she does not think she was feeling stressed at the time.Marland Kitchen  Hear score of 3 chest pain is not likely of cardiac or pulmonary etiology d/t presentation, PERC negative, VSS, no tracheal deviation, no JVD or new murmur, RRR,  breath sounds equal bilaterally, EKG without acute abnormalities, negative troponin, and negative CXR.  Consider musculoskeletal pain versus panic related.  Patient is to be discharged with recommendation to follow up with PCP in regards to today's hospital visit. Pt has been advised to return to the ED if CP becomes exertional, associated with diaphoresis or nausea, radiates to left jaw/arm, worsens or becomes concerning in any way. Pt appears reliable for follow up and is agreeable to discharge.   Discussed results, findings, treatment and follow up. Patient advised of return precautions. Patient verbalized understanding and agreed with plan.  Final Clinical Impression(s) / ED Diagnoses Final diagnoses:  Left-sided chest pain    Rx / DC Orders ED Discharge Orders    None       Jery Hollern, [place] N, PA-C 04/20/20 2109    Quintella Reichert, MD 04/21/20 1439

## 2020-10-01 ENCOUNTER — Emergency Department: Payer: BLUE CROSS/BLUE SHIELD

## 2020-10-01 ENCOUNTER — Telehealth: Payer: Self-pay | Admitting: Emergency Medicine

## 2020-10-01 ENCOUNTER — Emergency Department
Admission: EM | Admit: 2020-10-01 | Discharge: 2020-10-01 | Disposition: A | Discharge status: Home / Self Care | Payer: BLUE CROSS/BLUE SHIELD | Attending: Emergency Medicine | Admitting: Emergency Medicine

## 2020-10-01 ENCOUNTER — Other Ambulatory Visit: Payer: Self-pay

## 2020-10-01 DIAGNOSIS — M791 Myalgia, unspecified site: Secondary | ICD-10-CM | POA: Diagnosis present

## 2020-10-01 DIAGNOSIS — U071 COVID-19: Secondary | ICD-10-CM | POA: Diagnosis not present

## 2020-10-01 DIAGNOSIS — J069 Acute upper respiratory infection, unspecified: Secondary | ICD-10-CM | POA: Diagnosis not present

## 2020-10-01 DIAGNOSIS — Z28310 Unvaccinated for covid-19: Secondary | ICD-10-CM | POA: Insufficient documentation

## 2020-10-01 DIAGNOSIS — Z1152 Encounter for screening for COVID-19: Secondary | ICD-10-CM

## 2020-10-01 HISTORY — DX: COVID-19: U07.1

## 2020-10-01 LAB — RESP PANEL BY RT-PCR (FLU A&B, COVID) ARPGX2
Influenza A by PCR: NEGATIVE
Influenza B by PCR: NEGATIVE
SARS Coronavirus 2 by RT PCR: POSITIVE — AB

## 2020-10-01 MED ORDER — ONDANSETRON 4 MG PO TBDP
4.0000 mg | ORAL_TABLET | Freq: Once | ORAL | Status: AC
  Administered 2020-10-01: 08:00:00 4 mg via ORAL
  Filled 2020-10-01: qty 1

## 2020-10-01 MED ORDER — ACETAMINOPHEN 325 MG PO TABS
650.0000 mg | ORAL_TABLET | Freq: Once | ORAL | Status: AC | PRN
  Administered 2020-10-01: 07:00:00 650 mg via ORAL
  Filled 2020-10-01: qty 2

## 2020-10-01 MED ORDER — NIRMATRELVIR/RITONAVIR (PAXLOVID)TABLET: 3.0000 | ORAL_TABLET | Freq: Two times a day (BID) | ORAL | 0 refills | Status: AC

## 2020-10-01 NOTE — ED Provider Notes (Signed)
[facility] Emergency Department Provider Note   ____________________________________________   Event Date/Time   First MD Initiated Contact with Patient 10/01/20 [phone]     (approximate)  I have reviewed the triage vital signs and the nursing notes.   HISTORY  Chief Complaint Fever and Cough   HPI Victoria Bender is a 50 y.o. female presents to the ED with complaint of generalized body aches, headache, fever and cough x3 days.  Patient states she has taken over-the-counter medication without any relief.  Patient is non-smoker and no history of bronchitis or pneumonia in the past.  Patient has not been vaccine for COVID.  Rates her pain as 4 out of 10.         Past Medical History:  Diagnosis Date   Allergic rhinitis    Chronic epigastric pain    recurrent discrete episodes   Iron deficiency anemia    Iron deficiency anemia due to chronic blood loss 07/05/2018   Panic attack as reaction to stress    Restless leg syndrome     Patient Active Problem List   Diagnosis Date Noted   Iron deficiency anemia due to chronic blood loss - menorrhagia suspected as cause 07/05/2018   Family history of colonic polyps - brother 31's and father 30's 07/05/2018    Past Surgical History:  Procedure Laterality Date   CHOLECYSTECTOMY  2012   TUBAL LIGATION     UPPER GASTROINTESTINAL ENDOSCOPY  11/25/2010   NORMAL    Prior to Admission medications   Medication Sig Start Date End Date Taking? Authorizing Provider  clonazePAM (KLONOPIN) 1 MG tablet Take 1 mg by mouth at bedtime.    [provider]  ibuprofen (ADVIL) 200 MG tablet Take 200 mg by mouth every 6 (six) hours as needed for fever or mild pain.    [provider]  nirmatrelvir/ritonavir EUA (PAXLOVID) TABS Take 3 tablets by mouth 2 (two) times daily for 5 days. Patient GFR is greater than 60 Take nirmatrelvir (150 mg) two tablets twice daily for 5 days and ritonavir (100 mg) one tablet  twice daily for 5 days. 10/01/20 10/06/20  Letitia Neri L, PA-C  polyethylene glycol powder (GLYCOLAX/MIRALAX) 17 GM/SCOOP powder Take 1 Container by mouth once.    [provider]  Prenatal Vit-Fe Fumarate-FA (PRENATAL MULTIVITAMIN) TABS tablet Take 1 tablet by mouth daily at 12 noon.    [provider]    Allergies Patient has no known allergies.  Family History  Problem Relation Age of Onset   Hypertension Father    Colon polyps Father    Heart attack Brother    Colon polyps Brother        adenomatous polyps   Pancreatic cancer Maternal Aunt    Diabetes Maternal Grandmother    Colon cancer Neg Hx    Esophageal cancer Neg Hx    Stomach cancer Neg Hx    Rectal cancer Neg Hx     Social History Social History   Tobacco Use   Smoking status: Never   Smokeless tobacco: Never  Vaping Use   Vaping Use: Never used  Substance Use Topics   Alcohol use: No    Comment: social   Drug use: No    Review of Systems Constitutional: Positive fever/chills Eyes: No visual changes. ENT: No sore throat. Cardiovascular: Denies chest pain. Respiratory: Denies shortness of breath.  Positive for cough. Gastrointestinal: No abdominal pain.  Positive nausea, no vomiting.  No diarrhea.  Genitourinary: Negative for dysuria. Musculoskeletal: Positive muscle aches. Skin: Negative for rash. Neurological: Positive for headaches, negative for focal weakness or numbness. ____________________________________________   PHYSICAL EXAM:  VITAL SIGNS: ED Triage Vitals  Enc Vitals Group     BP 10/01/20 0710 118/80     Pulse Rate 10/01/20 0710 (!) 103     Resp 10/01/20 0710 20     Temp 10/01/20 0710 (!) 100.5 F (38.1 C)     Temp Source 10/01/20 0710 Oral     SpO2 10/01/20 0710 100 %     Weight 10/01/20 0711 240 lb (108.9 kg)     Height 10/01/20 0711 5\' 5"  (1.651 m)     Head Circumference --      Peak Flow --      Pain Score 10/01/20 0711 4     Pain Loc --      Pain Edu?  --      Excl. in [place]? --     Constitutional: Alert and oriented. Well appearing and in no acute distress. Eyes: Conjunctivae are normal.  Head: Atraumatic. Nose: No congestion/rhinnorhea. Mouth/Throat: Mucous membranes are moist.  Oropharynx non-erythematous. Neck: No stridor.   Cardiovascular: Normal rate, regular rhythm. Grossly normal heart sounds.  Good peripheral circulation. Respiratory: Normal respiratory effort.  No retractions. Lungs CTAB. Gastrointestinal: Soft and nontender. No distention.  Musculoskeletal: Moves upper and lower extremities any difficulty and normal gait was noted. Neurologic:  Normal speech and language. No gross focal neurologic deficits are appreciated. No gait instability. Skin:  Skin is warm, dry and intact. No rash noted. Psychiatric: Mood and affect are normal. Speech and behavior are normal.  ____________________________________________   LABS (all labs ordered are listed, but only abnormal results are displayed)  Labs Reviewed  RESP PANEL BY RT-PCR (FLU A&B, COVID) ARPGX2 - Abnormal; Notable for the following components:      Result Value   SARS Coronavirus 2 by RT PCR POSITIVE (*)    All other components within normal limits   ____________________________________________  EKG  Deferred ____________________________________________  RADIOLOGY Leana Gamer, personally viewed and evaluated these images (plain radiographs) as part of my medical decision making, as well as reviewing the written report by the radiologist.  Official radiology report(s): DG Chest Port 1 View  Result Date: 10/01/2020 CLINICAL DATA:  Pt c/o generalized body aches, fever, cough, and headache since Friday. Pt has taken OTC medication with no relief. Pt is a nonsmoker. EXAM: PORTABLE CHEST 1 VIEW COMPARISON:  04/20/2020 FINDINGS: Cardiac silhouette is normal in size. No mediastinal or hilar masses. Clear lungs.  No pleural effusion or pneumothorax. Skeletal  structures are grossly intact. IMPRESSION: No active disease. Electronically Signed   By: Lajean Manes M.D.   On: 10/01/2020 08:36    ____________________________________________   PROCEDURES  Procedure(s) performed (including Critical Care):  Procedures   ____________________________________________   INITIAL IMPRESSION / ASSESSMENT AND PLAN / ED COURSE  As part of my medical decision making, I reviewed the following data within the electronic MEDICAL RECORD NUMBER Notes from prior ED visits and [place] Controlled Substance Database  50 year old female presents to the ED with 3 days of fever, headache, generalized body aches and cough.  Patient not get the COVID-vaccine and has no known COVID exposure.  Chest x-ray was negative for pneumonia or bronchitis.  I discussed symptoms with patient and she is agreeable to watch for the results of her COVID test on MyChart.  She is aware that if it is  positive she will need to remain out of work another 7 days that she has had 3 days of symptoms.  We will quarantine during that time.  We also discussed the possibility of her taking Paxlovid if her test is positive and she is agreeable. ____________________________________________   FINAL CLINICAL IMPRESSION(S) / ED DIAGNOSES  Final diagnoses:  Viral URI with cough  Encounter for screening for COVID-19     ED Discharge Orders     None        Note:  This document was prepared using Dragon voice recognition software and may include unintentional dictation errors.    Johnn Hai, PA-C 10/01/20 1610    Vladimir Crofts, MD 10/02/20 [phone]

## 2020-10-01 NOTE — ED Notes (Addendum)
See triage note  Presents with low grade temp and cough with body aches  States sxs' started on Friday  States she has been taking OTC meds with min relief

## 2020-10-01 NOTE — Discharge Instructions (Addendum)
Call your primary care provider if any continued problems or concerns.  Return to the emergency department if any severe worsening of your symptoms such as difficulty breathing or shortness of breath.  Drink plenty of fluids to stay hydrated and continue with Tylenol or ibuprofen as needed for body aches, headache or fever.  Your chest x-ray was negative for pneumonia or bronchitis.  You may see the results of your COVID test on MyChart.  If your COVID test is positive we will send the paxlovid  to the pharmacy for you to begin taking.  Also if you are COVID-positive you will need to remain out of work and also quarantine for 7 more days as you have already had symptoms for 3 days.

## 2020-10-01 NOTE — ED Triage Notes (Signed)
Pt via POV from home. Pt c/o generalized body aches, fever, cough, and headache since Friday. Pt has taken OTC medication with no relief. Pt is A&Ox4 and NAD. Ambulatory to triage.

## 2021-02-22 ENCOUNTER — Other Ambulatory Visit: Payer: Self-pay

## 2021-02-22 DIAGNOSIS — Z5321 Procedure and treatment not carried out due to patient leaving prior to being seen by health care provider: Secondary | ICD-10-CM | POA: Insufficient documentation

## 2021-02-22 DIAGNOSIS — H938X3 Other specified disorders of ear, bilateral: Secondary | ICD-10-CM | POA: Diagnosis present

## 2021-02-22 NOTE — ED Triage Notes (Signed)
Pt c/o left ear fullness and right ear popping sensation that started around 6pm. Pt denies headache or illnesses.

## 2021-02-23 ENCOUNTER — Emergency Department (HOSPITAL_COMMUNITY)
Admission: EM | Admit: 2021-02-23 | Discharge: 2021-02-23 | Disposition: A | Discharge status: Home / Self Care | Payer: BLUE CROSS/BLUE SHIELD | Attending: Emergency Medicine | Admitting: Emergency Medicine

## 2021-02-23 ENCOUNTER — Emergency Department
Admission: EM | Admit: 2021-02-23 | Discharge: 2021-02-23 | Disposition: A | Discharge status: Home / Self Care | Payer: BLUE CROSS/BLUE SHIELD | Attending: Emergency Medicine | Admitting: Emergency Medicine

## 2021-02-23 DIAGNOSIS — H7292 Unspecified perforation of tympanic membrane, left ear: Secondary | ICD-10-CM | POA: Insufficient documentation

## 2021-02-23 DIAGNOSIS — H9202 Otalgia, left ear: Secondary | ICD-10-CM | POA: Diagnosis present

## 2021-02-23 NOTE — ED Triage Notes (Signed)
Patient c/o ears draining x 2 weeks, cleaned with q tip last night, now right ear popping and left feels clogged. Pain 3/10

## 2021-02-23 NOTE — Discharge Instructions (Addendum)
It appears you have a small perforation in your left eardrum.  This is common.  Your ears do not appear infected.  It is important that you keep your ears dry at this time.  Try and keep them closed in the shower and refrain from swimming at this time.  Most of these heal on their own, however I am attaching an office to your discharge papers for you to follow-up with about your symptoms.  You do not need antibiotics at this time.  Avoid Q-tips as they can cause further damage or earwax impactions.

## 2021-02-23 NOTE — ED Provider Notes (Signed)
[facility] DEPT Provider Note   CSN: [id number] Arrival date & time: 02/23/21  1331     History Chief Complaint  Patient presents with   Otalgia    Victoria Bender is a 50 y.o. female past medical history of allergic rhinitis presenting today with ear fullness.  Patient reports that she was sick with congestion, sore throat and other URI symptoms 2 weeks ago.  The symptoms have cleared up however she continues to feel as though she has fullness in her ears.  At one point her left ear was painful however now she denies any pain.  Has been using Q-tips because she felt as though she was having some drainage.  Never had an ear infection, not a swimmer.  Denies fever or chills.   Past Medical History:  Diagnosis Date   Allergic rhinitis    Chronic epigastric pain    recurrent discrete episodes   Iron deficiency anemia    Iron deficiency anemia due to chronic blood loss 07/05/2018   Panic attack as reaction to stress    Restless leg syndrome     Patient Active Problem List   Diagnosis Date Noted   Iron deficiency anemia due to chronic blood loss - menorrhagia suspected as cause 07/05/2018   Family history of colonic polyps - brother 38's and father 48's 07/05/2018    Past Surgical History:  Procedure Laterality Date   CHOLECYSTECTOMY  2012   TUBAL LIGATION     UPPER GASTROINTESTINAL ENDOSCOPY  11/25/2010   NORMAL     OB History     Gravida  3   Para  2   Term  2   Preterm      AB  1   Living  2      SAB  1   IAB      Ectopic      Multiple      Live Births              Family History  Problem Relation Age of Onset   Hypertension Father    Colon polyps Father    Heart attack Brother    Colon polyps Brother        adenomatous polyps   Pancreatic cancer Maternal Aunt    Diabetes Maternal Grandmother    Colon cancer Neg Hx    Esophageal cancer Neg Hx    Stomach cancer Neg Hx    Rectal cancer Neg Hx      Social History   Tobacco Use   Smoking status: Never   Smokeless tobacco: Never  Vaping Use   Vaping Use: Never used  Substance Use Topics   Alcohol use: No    Comment: social   Drug use: No    Home Medications Prior to Admission medications   Medication Sig Start Date End Date Taking? Authorizing Provider  clonazePAM (KLONOPIN) 1 MG tablet Take 1 mg by mouth at bedtime.    [provider]  ibuprofen (ADVIL) 200 MG tablet Take 200 mg by mouth every 6 (six) hours as needed for fever or mild pain.    [provider]  polyethylene glycol powder (GLYCOLAX/MIRALAX) 17 GM/SCOOP powder Take 1 Container by mouth once.    [provider]  Prenatal Vit-Fe Fumarate-FA (PRENATAL MULTIVITAMIN) TABS tablet Take 1 tablet by mouth daily at 12 noon.    [provider]    Allergies    Patient has no known allergies.  Review of Systems  Review of Systems  HENT:  Positive for ear pain and hearing loss. Negative for congestion, sinus pressure, sore throat and tinnitus.   Neurological:  Negative for dizziness and headaches.  All other systems reviewed and are negative.  Physical Exam Updated Vital Signs BP 128/76 (BP Location: Right Arm)   Pulse 95   Temp 98.2 F (36.8 C) (Oral)   Resp 18   Ht 5\' 5"  (1.651 m)   Wt 108.9 kg   LMP 02/20/2021 (Exact Date)   SpO2 100%   BMI 39.94 kg/m   Physical Exam Vitals and nursing note reviewed.  Constitutional:      Appearance: Normal appearance.  HENT:     Head: Normocephalic and atraumatic.     Right Ear: Tympanic membrane, ear canal and external ear normal. There is no impacted cerumen.     Left Ear: Ear canal and external ear normal. There is no impacted cerumen.     Ears:     Comments: Small perforation in the left eardrum.  No signs of infection or drainage.  Right ear within normal limits.    Mouth/Throat:     Mouth: Mucous membranes are moist.     Pharynx: Oropharynx is clear.  Eyes:      General: No scleral icterus.    Conjunctiva/sclera: Conjunctivae normal.  Pulmonary:     Effort: Pulmonary effort is normal. No respiratory distress.  Skin:    Findings: No rash.  Neurological:     Mental Status: She is alert.  Psychiatric:        Mood and Affect: Mood normal.    ED Results / Procedures / Treatments   Labs (all labs ordered are listed, but only abnormal results are displayed) Labs Reviewed - No data to display  EKG None  Radiology No results found.  Procedures Procedures   Medications Ordered in ED Medications - No data to display  ED Course  I have reviewed the triage vital signs and the nursing notes.  Pertinent labs & imaging results that were available during my care of the patient were reviewed by me and considered in my medical decision making (see chart for details).    MDM Rules/Calculators/A&P No signs of infection in either ear.  I suspect that she obtained a perforation to her left eardrum after being sick a few weeks ago.  No signs of infection, I would not treat with antibiotics.  We discussed the importance of keeping her ears dry.  She has been given an ENT office to follow-up with.  She is agreeable to the plan. Final Clinical Impression(s) / ED Diagnoses Final diagnoses:  Perforation of left tympanic membrane    Rx / DC Orders Results and diagnoses were explained to the patient. Return precautions discussed in full. Patient had no additional questions and expressed complete understanding.     Rhae Hammock, PA-C 02/23/21 1504    Wynona Dove A, DO 02/23/21 1553

## 2021-03-04 ENCOUNTER — Telehealth: Payer: Self-pay | Admitting: Hematology

## 2021-03-04 NOTE — Telephone Encounter (Signed)
Scheduled appt per 12/5 referral. Pt is aware of appt date and time.

## 2021-03-28 ENCOUNTER — Inpatient Hospital Stay: Payer: BLUE CROSS/BLUE SHIELD | Attending: Hematology | Admitting: Hematology

## 2021-03-28 ENCOUNTER — Other Ambulatory Visit: Payer: Self-pay

## 2021-03-28 ENCOUNTER — Inpatient Hospital Stay: Payer: BLUE CROSS/BLUE SHIELD

## 2021-03-28 VITALS — BP 117/70 | HR 92 | Temp 98.1°F | Resp 16 | Wt 243.6 lb

## 2021-03-28 DIAGNOSIS — G2581 Restless legs syndrome: Secondary | ICD-10-CM | POA: Diagnosis not present

## 2021-03-28 DIAGNOSIS — Z79899 Other long term (current) drug therapy: Secondary | ICD-10-CM | POA: Diagnosis not present

## 2021-03-28 DIAGNOSIS — F5089 Other specified eating disorder: Secondary | ICD-10-CM | POA: Insufficient documentation

## 2021-03-28 DIAGNOSIS — D500 Iron deficiency anemia secondary to blood loss (chronic): Secondary | ICD-10-CM | POA: Insufficient documentation

## 2021-03-28 DIAGNOSIS — N920 Excessive and frequent menstruation with regular cycle: Secondary | ICD-10-CM | POA: Diagnosis not present

## 2021-03-28 LAB — CMP (CANCER CENTER ONLY)
ALT: 7 U/L (ref 0–44)
AST: 10 U/L — ABNORMAL LOW (ref 15–41)
Albumin: 3.7 g/dL (ref 3.5–5.0)
Alkaline Phosphatase: 44 U/L (ref 38–126)
Anion gap: 4 — ABNORMAL LOW (ref 5–15)
BUN: 6 mg/dL (ref 6–20)
CO2: 25 mmol/L (ref 22–32)
Calcium: 8.9 mg/dL (ref 8.9–10.3)
Chloride: 109 mmol/L (ref 98–111)
Creatinine: 0.88 mg/dL (ref 0.44–1.00)
GFR, Estimated: >60 mL/min (ref >60)
Glucose, Bld: 100 mg/dL — ABNORMAL HIGH (ref 70–99)
Potassium: 3.9 mmol/L (ref 3.5–5.1)
Sodium: 138 mmol/L (ref 135–145)
Total Bilirubin: 0.6 mg/dL (ref 0.3–1.2)
Total Protein: 6.9 g/dL (ref 6.5–8.1)

## 2021-03-28 LAB — IRON AND IRON BINDING CAPACITY (CC-WL,HP ONLY)
Iron: 14 ug/dL — ABNORMAL LOW (ref 28–170)
Saturation Ratios: 3 % — ABNORMAL LOW (ref 10.4–31.8)
TIBC: 525 ug/dL — ABNORMAL HIGH (ref 250–450)
UIBC: 511 ug/dL — ABNORMAL HIGH (ref 148–442)

## 2021-03-28 LAB — CBC WITH DIFFERENTIAL/PLATELET
Abs Immature Granulocytes: 0.02 10*3/uL (ref 0.00–0.07)
Basophils Absolute: 0 10*3/uL (ref 0.0–0.1)
Basophils Relative: 1 %
Eosinophils Absolute: 0.1 10*3/uL (ref 0.0–0.5)
Eosinophils Relative: 2 %
HCT: 25.5 % — ABNORMAL LOW (ref 36.0–46.0)
Hemoglobin: 7.2 g/dL — ABNORMAL LOW (ref 12.0–15.0)
Immature Granulocytes: 0 %
Lymphocytes Relative: 24 %
Lymphs Abs: 1.6 10*3/uL (ref 0.7–4.0)
MCH: 19.3 pg — ABNORMAL LOW (ref 26.0–34.0)
MCHC: 28.2 g/dL — ABNORMAL LOW (ref 30.0–36.0)
MCV: 68.2 fL — ABNORMAL LOW (ref 80.0–100.0)
Monocytes Absolute: 0.5 10*3/uL (ref 0.1–1.0)
Monocytes Relative: 7 %
Neutro Abs: 4.3 10*3/uL (ref 1.7–7.7)
Neutrophils Relative %: 66 %
Platelets: 380 10*3/uL (ref 150–400)
RBC: 3.74 MIL/uL — ABNORMAL LOW (ref 3.87–5.11)
RDW: 19.9 % — ABNORMAL HIGH (ref 11.5–15.5)
Smear Review: NORMAL
WBC: 6.5 10*3/uL (ref 4.0–10.5)
nRBC: 0 % (ref 0.0–0.2)

## 2021-03-28 LAB — FERRITIN: Ferritin: <4 ng/mL — ABNORMAL LOW (ref 11–307)

## 2021-03-28 NOTE — Progress Notes (Addendum)
Marland Kitchen   HEMATOLOGY/ONCOLOGY CONSULTATION NOTE  Date of Service: 03/28/2021  Patient Care Team: Hayden Rasmussen, MD as PCP - General (Family Medicine)  CHIEF COMPLAINTS/PURPOSE OF CONSULTATION:  IDA  HISTORY OF PRESENTING ILLNESS:   Victoria Bender is a wonderful 50 y.o. female who has been referred to [place] by Dr .Darron Doom, Maebelle Munroe, MD  for evaluation and management of Iron deficiency anemia.  Patient has history of allergic rhinitis, persistent asthma, deficiency anemia due to history of ulcers restless leg syndrome was previously seen by [place] this 20 2019 for iron deficiency anemia related to chronic blood loss from menorrhagia.  She received IV Injectafer x2 doses and was able to follow-up with [place] did not return to care.  She notes she has had some insurance issues that precluded appropriate medical follow-up.  Patient notes she continues to have heavy periods.  Recent labs with her primary care physician on 02/01/2021 showed hemoglobin of 8.1 with an MCV of 70.9 with WBC count of 7.1k and platelets of 428k Ferritin 2.3  Patient notes no overt GI bleeding.  No hematochezia or melena.  No hematuria.  No gum bleeding.  No recent surgeries causing blood loss. Patient notes no issues with tolerance of previous IV iron infusion. Patient notes significant fatigue, recurrence of severe pica symptoms and craving for chewing ice, worsening of her restless leg syndrome. She is scheduled to receive IV iron provided consent to set this up.  She is aware about the pros and cons of IV iron infusion and would like to proceed.   MEDICAL HISTORY:  Past Medical History:  Diagnosis Date   Allergic rhinitis    Chronic epigastric pain    recurrent discrete episodes   Iron deficiency anemia    Iron deficiency anemia due to chronic blood loss 07/05/2018   Panic attack as reaction to stress    Restless leg syndrome     SURGICAL HISTORY: Past Surgical History:  Procedure Laterality Date    CHOLECYSTECTOMY  2012   TUBAL LIGATION     UPPER GASTROINTESTINAL ENDOSCOPY  11/25/2010   NORMAL    SOCIAL HISTORY: Social History   Socioeconomic History   Marital status: Married    Spouse name: Not on file   Number of children: 2   Years of education: Not on file   Highest education level: Not on file  Occupational History   Not on file  Tobacco Use   Smoking status: Never   Smokeless tobacco: Never  Vaping Use   Vaping Use: Never used  Substance and Sexual Activity   Alcohol use: No    Comment: social   Drug use: No   Sexual activity: Yes    Birth control/protection: Surgical  Other Topics Concern   Not on file  Social History Narrative   Separated - living with her mom   2 adult kids   Work - self-employed - Public relations account executive, rare EtOH, no drugs   Social Determinants of Radio broadcast assistant Strain: Not on file  Food Insecurity: Not on file  Transportation Needs: Not on file  Physical Activity: Not on file  Stress: Not on file  Social Connections: Not on file  Intimate Partner Violence: Not on file    FAMILY HISTORY: Family History  Problem Relation Age of Onset   Hypertension Father    Colon polyps Father    Heart attack Brother    Colon polyps Brother        adenomatous  polyps   Pancreatic cancer Maternal Aunt    Diabetes Maternal Grandmother    Colon cancer Neg Hx    Esophageal cancer Neg Hx    Stomach cancer Neg Hx    Rectal cancer Neg Hx     ALLERGIES:  has No Known Allergies.  MEDICATIONS:  Current Outpatient Medications  Medication Sig Dispense Refill   clonazePAM (KLONOPIN) 1 MG tablet Take 1 mg by mouth at bedtime.     ibuprofen (ADVIL) 200 MG tablet Take 200 mg by mouth every 6 (six) hours as needed for fever or mild pain.     polyethylene glycol powder (GLYCOLAX/MIRALAX) 17 GM/SCOOP powder Take 1 Container by mouth once.     Prenatal Vit-Fe Fumarate-FA (PRENATAL MULTIVITAMIN) TABS tablet Take 1 tablet by mouth daily at 12  noon.     Current Facility-Administered Medications  Medication Dose Route Frequency Provider Last Rate Last Admin   0.9 %  sodium chloride infusion  500 mL Intravenous Once [name], MD        REVIEW OF SYSTEMS:    .10 Point review of Systems was done is negative except as noted above.  PHYSICAL EXAMINATION: ECOG PERFORMANCE STATUS: 1  . Vitals:   03/28/21 1307  BP: 117/70  Pulse: 92  Resp: 16  Temp: 98.1 F (36.7 C)  SpO2: 100%   Filed Weights   03/28/21 1307  Weight: 243 lb 9.6 oz (110.5 kg)   .Body mass index is 40.54 kg/m. Marland Kitchen GENERAL:alert, in no acute distress and comfortable SKIN: no acute rashes, no significant lesions EYES: conjunctiva are pink and non-injected, sclera anicteric OROPHARYNX: MMM, no exudates, no oropharyngeal erythema or ulceration NECK: supple, no JVD LYMPH:  no palpable lymphadenopathy in the cervical, axillary or inguinal regions LUNGS: clear to auscultation b/l with normal respiratory effort HEART: regular rate & rhythm ABDOMEN:  normoactive bowel sounds , non tender, not distended. Extremity: no pedal edema PSYCH: alert & oriented x 3 with fluent speech NEURO: no focal motor/sensory deficits   LABORATORY DATA:  I have reviewed the data as listed  . CBC Latest Ref Rng & Units 03/28/2021 04/20/2020 07/28/2018  WBC 4.0 - 10.5 K/uL 6.5 8.6 8.2  Hemoglobin 12.0 - 15.0 g/dL 7.2(L) 10.3(L) 12.5  Hematocrit 36.0 - 46.0 % 25.5(L) 34.2(L) 39.7  Platelets 150 - 400 K/uL 380 294 289    . CMP Latest Ref Rng & Units 03/28/2021 04/20/2020 07/28/2018  Glucose 70 - 99 mg/dL 100(H) 101(H) 109(H)  BUN 6 - 20 mg/dL 6 7 8   Creatinine 0.44 - 1.00 mg/dL 0.88 0.89 1.04(H)  Sodium 135 - 145 mmol/L 138 139 137  Potassium 3.5 - 5.1 mmol/L 3.9 3.9 4.3  Chloride 98 - 111 mmol/L 109 107 108  CO2 22 - 32 mmol/L 25 23 20(L)  Calcium 8.9 - 10.3 mg/dL 8.9 9.0 9.4  Total Protein 6.5 - 8.1 g/dL 6.9 - -  Total Bilirubin 0.3 - 1.2 mg/dL 0.6 - -  Alkaline  Phos 38 - 126 U/L 44 - -  AST 15 - 41 U/L 10(L) - -  ALT 0 - 44 U/L 7 - -   . Lab Results  Component Value Date   IRON 14 (L) 03/28/2021   TIBC 525 (H) 03/28/2021   IRONPCTSAT 3 (L) 03/28/2021   (Iron and TIBC)  Lab Results  Component Value Date   FERRITIN <4 (L) 03/28/2021     RADIOGRAPHIC STUDIES: I have personally reviewed the radiological images as listed and agreed  with the findings in the report. No results found.  ASSESSMENT & PLAN:   50 year old female with history of recurrent iron deficiency anemia from menorrhagia with  1) Symptomatic severe iron deficiency Anemia due to menorrhagia. 2) menorrhagia -unknown etiology will need OB/GYN follow-up. 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 PLAN -Discussed available labs in detail with the patient. -Given her severe iron deficiency would recommend urgent IV iron infusion.  IV Injectafer 750 mg weekly x2 was ordered but based on insurance requirements this was changed to Venofer 300 mg weekly x4 infusions. -She was recommended to start taking vitamin B complex 1 capsule p.o. daily over-the-counter to support accelerated hematopoiesis. -She needs to follow-up with her primary care physician for OB/GYN referral for control of her menorrhagia. -She was counseled to call us if she develops any lightheadedness or dizziness since this might be an indication for needing PRBC transfusion. -Follow-up with primary care physician/OB/GYN for management of menorrhagia and discussed with primary care physician need for GI work-up/FOBT.  Patient has had a colonoscopy with Dr. Carlean Purl on 09/13/2018 which showed small 1 to 2 mm polyp in the distal transverse colon multiple diverticula in the sigmoid colon otherwise unremarkable.  Follow-up Labs today Repeat labs in 2 to 3 weeks to ensure that the hemoglobin is not dropping below 7 and needing PRBC transfusion. IV  Venofer 300 mg weekly x4 doses RTC with Dr Irene Limbo with labs in 3 months  All of the patients questions were answered with apparent satisfaction. The patient knows to call the clinic with any problems, questions or concerns.  I spent  counseling the patient face to face. The total time spent in the appointment was and more than 50% was on counseling and direct patient cares.    Sullivan Lone MD [place] AAHIVMS [facility] Hematology/Oncology Physician [facility]

## 2021-04-03 ENCOUNTER — Encounter: Payer: Self-pay | Admitting: Hematology

## 2021-04-03 ENCOUNTER — Telehealth: Payer: Self-pay | Admitting: Hematology

## 2021-04-03 NOTE — Telephone Encounter (Signed)
Scheduled follow-up appointments per 12/29 los. Patient is aware. 

## 2021-04-04 ENCOUNTER — Inpatient Hospital Stay: Payer: BC Managed Care – PPO | Attending: Hematology

## 2021-04-04 ENCOUNTER — Other Ambulatory Visit: Payer: Self-pay

## 2021-04-04 ENCOUNTER — Other Ambulatory Visit: Payer: Self-pay | Admitting: Hematology

## 2021-04-04 VITALS — BP 133/66 | HR 84 | Temp 98.2°F | Resp 16 | Wt 237.5 lb

## 2021-04-04 DIAGNOSIS — Z79899 Other long term (current) drug therapy: Secondary | ICD-10-CM | POA: Diagnosis not present

## 2021-04-04 DIAGNOSIS — D500 Iron deficiency anemia secondary to blood loss (chronic): Secondary | ICD-10-CM | POA: Diagnosis present

## 2021-04-04 MED ORDER — SODIUM CHLORIDE 0.9 % IV SOLN
200.0000 mg | Freq: Once | INTRAVENOUS | Status: AC
  Administered 2021-04-04: 200 mg via INTRAVENOUS
  Filled 2021-04-04: qty 200

## 2021-04-04 MED ORDER — SODIUM CHLORIDE 0.9 % IV SOLN: 750.0000 mg | Freq: Once | INTRAVENOUS | Status: DC

## 2021-04-04 MED ORDER — SODIUM CHLORIDE 0.9 % IV SOLN: Freq: Once | INTRAVENOUS | Status: AC

## 2021-04-04 MED ORDER — LORATADINE 10 MG PO TABS
10.0000 mg | ORAL_TABLET | Freq: Once | ORAL | Status: AC
  Administered 2021-04-04: 10 mg via ORAL
  Filled 2021-04-04: qty 1

## 2021-04-04 MED ORDER — ACETAMINOPHEN 325 MG PO TABS
650.0000 mg | ORAL_TABLET | Freq: Once | ORAL | Status: AC
  Administered 2021-04-04: 650 mg via ORAL
  Filled 2021-04-04: qty 2

## 2021-04-04 NOTE — Progress Notes (Signed)
Pt observed for 30 minutes post Venofer infusion. Pt tolerated treatment well w/out incident.  VSS at discharge. Ambulatory to lobby.

## 2021-04-04 NOTE — Patient Instructions (Signed)

## 2021-04-08 ENCOUNTER — Telehealth: Payer: Self-pay

## 2021-04-08 NOTE — Telephone Encounter (Signed)
Per Dr Irene Limbo: Contacted patient to let her know we will need to schedule additional 4 doses of Venofer 300 mg weekly.  Her hemoglobin is 7.2 on previous labs and please let her know to call us if she develops any new dizziness or lightheadedness since she might need PRBC transfusion.  She needs repeat labs in 2 weeks to ensure that hemoglobin is going in the right direction.  We will see her back in clinic in 2 to 3 months with repeat labs to evaluate response to iron.  Please remind her she needs to follow-up with her primary care physician for OB/GYN and GI work-up. Pt acknowledged and verbalized understanding. Pt had dose 1 of Venofer last week and has dose 2 scheduled on 04/11/21. Will send scheduling a message to get 2 more doses scheduled.

## 2021-04-10 ENCOUNTER — Other Ambulatory Visit: Payer: Self-pay

## 2021-04-10 DIAGNOSIS — D500 Iron deficiency anemia secondary to blood loss (chronic): Secondary | ICD-10-CM

## 2021-04-11 ENCOUNTER — Inpatient Hospital Stay: Payer: BC Managed Care – PPO

## 2021-04-11 ENCOUNTER — Other Ambulatory Visit: Payer: Self-pay

## 2021-04-11 VITALS — BP 105/69 | HR 73 | Temp 98.4°F | Resp 17

## 2021-04-11 DIAGNOSIS — D500 Iron deficiency anemia secondary to blood loss (chronic): Secondary | ICD-10-CM | POA: Diagnosis not present

## 2021-04-11 LAB — CBC WITH DIFFERENTIAL (CANCER CENTER ONLY)
Abs Immature Granulocytes: 0.01 10*3/uL (ref 0.00–0.07)
Basophils Absolute: 0 10*3/uL (ref 0.0–0.1)
Basophils Relative: 0 %
Eosinophils Absolute: 0.1 10*3/uL (ref 0.0–0.5)
Eosinophils Relative: 1 %
HCT: 28.1 % — ABNORMAL LOW (ref 36.0–46.0)
Hemoglobin: 7.8 g/dL — ABNORMAL LOW (ref 12.0–15.0)
Immature Granulocytes: 0 %
Lymphocytes Relative: 33 %
Lymphs Abs: 2 10*3/uL (ref 0.7–4.0)
MCH: 19.4 pg — ABNORMAL LOW (ref 26.0–34.0)
MCHC: 27.8 g/dL — ABNORMAL LOW (ref 30.0–36.0)
MCV: 69.7 fL — ABNORMAL LOW (ref 80.0–100.0)
Monocytes Absolute: 0.3 10*3/uL (ref 0.1–1.0)
Monocytes Relative: 5 %
Neutro Abs: 3.7 10*3/uL (ref 1.7–7.7)
Neutrophils Relative %: 61 %
Platelet Count: 316 10*3/uL (ref 150–400)
RBC: 4.03 MIL/uL (ref 3.87–5.11)
RDW: 22.3 % — ABNORMAL HIGH (ref 11.5–15.5)
WBC Count: 6.1 10*3/uL (ref 4.0–10.5)
nRBC: 0 % (ref 0.0–0.2)

## 2021-04-11 LAB — CMP (CANCER CENTER ONLY)
ALT: 6 U/L (ref 0–44)
AST: 9 U/L — ABNORMAL LOW (ref 15–41)
Albumin: 3.7 g/dL (ref 3.5–5.0)
Alkaline Phosphatase: 47 U/L (ref 38–126)
Anion gap: 5 (ref 5–15)
BUN: 6 mg/dL (ref 6–20)
CO2: 23 mmol/L (ref 22–32)
Calcium: 8.8 mg/dL — ABNORMAL LOW (ref 8.9–10.3)
Chloride: 110 mmol/L (ref 98–111)
Creatinine: 0.85 mg/dL (ref 0.44–1.00)
GFR, Estimated: >60 mL/min (ref >60)
Glucose, Bld: 106 mg/dL — ABNORMAL HIGH (ref 70–99)
Potassium: 3.9 mmol/L (ref 3.5–5.1)
Sodium: 138 mmol/L (ref 135–145)
Total Bilirubin: 0.3 mg/dL (ref 0.3–1.2)
Total Protein: 6.9 g/dL (ref 6.5–8.1)

## 2021-04-11 LAB — FERRITIN: Ferritin: 30 ng/mL (ref 11–307)

## 2021-04-11 LAB — IRON AND IRON BINDING CAPACITY (CC-WL,HP ONLY)
Iron: 472 ug/dL — ABNORMAL HIGH (ref 28–170)
Saturation Ratios: 95 % — ABNORMAL HIGH (ref 10.4–31.8)
TIBC: 497 ug/dL — ABNORMAL HIGH (ref 250–450)
UIBC: 25 ug/dL — ABNORMAL LOW (ref 148–442)

## 2021-04-11 MED ORDER — SODIUM CHLORIDE 0.9 % IV SOLN: Freq: Once | INTRAVENOUS | Status: AC

## 2021-04-11 MED ORDER — ACETAMINOPHEN 325 MG PO TABS
650.0000 mg | ORAL_TABLET | Freq: Once | ORAL | Status: AC
  Administered 2021-04-11: 650 mg via ORAL
  Filled 2021-04-11: qty 2

## 2021-04-11 MED ORDER — SODIUM CHLORIDE 0.9 % IV SOLN
300.0000 mg | Freq: Once | INTRAVENOUS | Status: AC
  Administered 2021-04-11: 300 mg via INTRAVENOUS
  Filled 2021-04-11: qty 300

## 2021-04-11 MED ORDER — LORATADINE 10 MG PO TABS
10.0000 mg | ORAL_TABLET | Freq: Once | ORAL | Status: AC
  Administered 2021-04-11: 10 mg via ORAL
  Filled 2021-04-11: qty 1

## 2021-04-11 NOTE — Patient Instructions (Signed)

## 2021-04-18 ENCOUNTER — Inpatient Hospital Stay: Payer: BC Managed Care – PPO

## 2021-04-18 ENCOUNTER — Other Ambulatory Visit: Payer: Self-pay

## 2021-04-18 VITALS — BP 107/72 | HR 79 | Temp 98.2°F | Resp 16 | Ht 65.0 in | Wt 239.2 lb

## 2021-04-18 DIAGNOSIS — D500 Iron deficiency anemia secondary to blood loss (chronic): Secondary | ICD-10-CM

## 2021-04-18 MED ORDER — SODIUM CHLORIDE 0.9 % IV SOLN: Freq: Once | INTRAVENOUS | Status: AC

## 2021-04-18 MED ORDER — ACETAMINOPHEN 325 MG PO TABS
650.0000 mg | ORAL_TABLET | Freq: Once | ORAL | Status: AC
  Administered 2021-04-18: 650 mg via ORAL
  Filled 2021-04-18: qty 2

## 2021-04-18 MED ORDER — LORATADINE 10 MG PO TABS
10.0000 mg | ORAL_TABLET | Freq: Once | ORAL | Status: AC
  Administered 2021-04-18: 10 mg via ORAL
  Filled 2021-04-18: qty 1

## 2021-04-18 MED ORDER — SODIUM CHLORIDE 0.9 % IV SOLN
300.0000 mg | Freq: Once | INTRAVENOUS | Status: AC
  Administered 2021-04-18: 300 mg via INTRAVENOUS
  Filled 2021-04-18: qty 300

## 2021-04-18 NOTE — Patient Instructions (Signed)

## 2021-04-24 ENCOUNTER — Other Ambulatory Visit: Payer: Self-pay

## 2021-04-24 DIAGNOSIS — D500 Iron deficiency anemia secondary to blood loss (chronic): Secondary | ICD-10-CM

## 2021-04-25 ENCOUNTER — Inpatient Hospital Stay: Payer: BC Managed Care – PPO

## 2021-04-25 ENCOUNTER — Other Ambulatory Visit: Payer: Self-pay

## 2021-04-25 VITALS — BP 92/64 | HR 65 | Temp 98.2°F | Resp 16

## 2021-04-25 DIAGNOSIS — D500 Iron deficiency anemia secondary to blood loss (chronic): Secondary | ICD-10-CM

## 2021-04-25 LAB — CBC WITH DIFFERENTIAL (CANCER CENTER ONLY)
Abs Immature Granulocytes: 0 10*3/uL (ref 0.00–0.07)
Basophils Absolute: 0 10*3/uL (ref 0.0–0.1)
Basophils Relative: 0 %
Eosinophils Absolute: 0.1 10*3/uL (ref 0.0–0.5)
Eosinophils Relative: 1 %
HCT: 31.8 % — ABNORMAL LOW (ref 36.0–46.0)
Hemoglobin: 9.2 g/dL — ABNORMAL LOW (ref 12.0–15.0)
Immature Granulocytes: 0 %
Lymphocytes Relative: 22 %
Lymphs Abs: 1.1 10*3/uL (ref 0.7–4.0)
MCH: 21.2 pg — ABNORMAL LOW (ref 26.0–34.0)
MCHC: 28.9 g/dL — ABNORMAL LOW (ref 30.0–36.0)
MCV: 73.4 fL — ABNORMAL LOW (ref 80.0–100.0)
Monocytes Absolute: 0.3 10*3/uL (ref 0.1–1.0)
Monocytes Relative: 6 %
Neutro Abs: 3.5 10*3/uL (ref 1.7–7.7)
Neutrophils Relative %: 71 %
Platelet Count: 336 10*3/uL (ref 150–400)
RBC: 4.33 MIL/uL (ref 3.87–5.11)
RDW: 27 % — ABNORMAL HIGH (ref 11.5–15.5)
WBC Count: 4.9 10*3/uL (ref 4.0–10.5)
nRBC: 0 % (ref 0.0–0.2)

## 2021-04-25 LAB — CMP (CANCER CENTER ONLY)
ALT: 8 U/L (ref 0–44)
AST: 12 U/L — ABNORMAL LOW (ref 15–41)
Albumin: 4 g/dL (ref 3.5–5.0)
Alkaline Phosphatase: 46 U/L (ref 38–126)
Anion gap: 6 (ref 5–15)
BUN: 6 mg/dL (ref 6–20)
CO2: 24 mmol/L (ref 22–32)
Calcium: 9.1 mg/dL (ref 8.9–10.3)
Chloride: 108 mmol/L (ref 98–111)
Creatinine: 0.81 mg/dL (ref 0.44–1.00)
GFR, Estimated: >60 mL/min (ref >60)
Glucose, Bld: 100 mg/dL — ABNORMAL HIGH (ref 70–99)
Potassium: 3.9 mmol/L (ref 3.5–5.1)
Sodium: 138 mmol/L (ref 135–145)
Total Bilirubin: 0.7 mg/dL (ref 0.3–1.2)
Total Protein: 7.3 g/dL (ref 6.5–8.1)

## 2021-04-25 LAB — IRON AND IRON BINDING CAPACITY (CC-WL,HP ONLY)
Iron: 39 ug/dL (ref 28–170)
Saturation Ratios: 8 % — ABNORMAL LOW (ref 10.4–31.8)
TIBC: 486 ug/dL — ABNORMAL HIGH (ref 250–450)
UIBC: 447 ug/dL — ABNORMAL HIGH (ref 148–442)

## 2021-04-25 LAB — FERRITIN: Ferritin: 85 ng/mL (ref 11–307)

## 2021-04-25 MED ORDER — SODIUM CHLORIDE 0.9 % IV SOLN: Freq: Once | INTRAVENOUS | Status: AC

## 2021-04-25 MED ORDER — LORATADINE 10 MG PO TABS
10.0000 mg | ORAL_TABLET | Freq: Once | ORAL | Status: AC
  Administered 2021-04-25: 10 mg via ORAL
  Filled 2021-04-25: qty 1

## 2021-04-25 MED ORDER — ACETAMINOPHEN 325 MG PO TABS
650.0000 mg | ORAL_TABLET | Freq: Once | ORAL | Status: AC
  Administered 2021-04-25: 650 mg via ORAL
  Filled 2021-04-25: qty 2

## 2021-04-25 MED ORDER — SODIUM CHLORIDE 0.9 % IV SOLN
300.0000 mg | Freq: Once | INTRAVENOUS | Status: AC
  Administered 2021-04-25: 300 mg via INTRAVENOUS
  Filled 2021-04-25: qty 300

## 2021-04-25 NOTE — Patient Instructions (Signed)

## 2021-04-25 NOTE — Progress Notes (Signed)
Pt declined to stay for 30 minute post observation.  Pt tolerated trtmt well without incident.  VSS at discharge.  Ambulatory to lobby.

## 2021-06-06 ENCOUNTER — Emergency Department (HOSPITAL_COMMUNITY)
Admission: EM | Admit: 2021-06-06 | Discharge: 2021-06-06 | Discharge status: Against Medical Advice | Payer: BC Managed Care – PPO | Attending: Emergency Medicine | Admitting: Emergency Medicine

## 2021-06-06 ENCOUNTER — Emergency Department (HOSPITAL_COMMUNITY): Payer: BC Managed Care – PPO

## 2021-06-06 DIAGNOSIS — Z5321 Procedure and treatment not carried out due to patient leaving prior to being seen by health care provider: Secondary | ICD-10-CM | POA: Insufficient documentation

## 2021-06-06 DIAGNOSIS — R002 Palpitations: Secondary | ICD-10-CM | POA: Diagnosis not present

## 2021-06-06 DIAGNOSIS — F419 Anxiety disorder, unspecified: Secondary | ICD-10-CM | POA: Insufficient documentation

## 2021-06-06 DIAGNOSIS — R42 Dizziness and giddiness: Secondary | ICD-10-CM | POA: Diagnosis not present

## 2021-06-06 DIAGNOSIS — R079 Chest pain, unspecified: Secondary | ICD-10-CM | POA: Diagnosis not present

## 2021-06-06 LAB — BASIC METABOLIC PANEL
Anion gap: 6 (ref 5–15)
BUN: 6 mg/dL (ref 6–20)
CO2: 23 mmol/L (ref 22–32)
Calcium: 8.9 mg/dL (ref 8.9–10.3)
Chloride: 108 mmol/L (ref 98–111)
Creatinine, Ser: 0.82 mg/dL (ref 0.44–1.00)
GFR, Estimated: >60 mL/min (ref >60)
Glucose, Bld: 106 mg/dL — ABNORMAL HIGH (ref 70–99)
Potassium: 3.7 mmol/L (ref 3.5–5.1)
Sodium: 137 mmol/L (ref 135–145)

## 2021-06-06 LAB — CBC WITH DIFFERENTIAL/PLATELET
Abs Immature Granulocytes: 0 10*3/uL (ref 0.00–0.07)
Basophils Absolute: 0 10*3/uL (ref 0.0–0.1)
Basophils Relative: 0 %
Eosinophils Absolute: 0.1 10*3/uL (ref 0.0–0.5)
Eosinophils Relative: 1 %
HCT: 35.2 % — ABNORMAL LOW (ref 36.0–46.0)
Hemoglobin: 10.6 g/dL — ABNORMAL LOW (ref 12.0–15.0)
Lymphocytes Relative: 19 %
Lymphs Abs: 1.2 10*3/uL (ref 0.7–4.0)
MCH: 24 pg — ABNORMAL LOW (ref 26.0–34.0)
MCHC: 30.1 g/dL (ref 30.0–36.0)
MCV: 79.6 fL — ABNORMAL LOW (ref 80.0–100.0)
Monocytes Absolute: 0.2 10*3/uL (ref 0.1–1.0)
Monocytes Relative: 4 %
Neutro Abs: 4.6 10*3/uL (ref 1.7–7.7)
Neutrophils Relative %: 76 %
Platelets: 283 10*3/uL (ref 150–400)
RBC: 4.42 MIL/uL (ref 3.87–5.11)
RDW: 22.3 % — ABNORMAL HIGH (ref 11.5–15.5)
WBC: 6.1 10*3/uL (ref 4.0–10.5)
nRBC: 0 % (ref 0.0–0.2)
nRBC: 0 /100 WBC

## 2021-06-06 LAB — TROPONIN I (HIGH SENSITIVITY)
Troponin I (High Sensitivity): 3 ng/L (ref <18)
Troponin I (High Sensitivity): 3 ng/L (ref <18)

## 2021-06-06 NOTE — ED Notes (Signed)
PT decided to leave. AMA 18:26 ?

## 2021-06-06 NOTE — ED Triage Notes (Signed)
Patient with history of anxiety BIB [facility] from work where she became anxious, felt like her heart was racing, and began hyperventilating. On EMS arrival to patient EMS calmed patient, return to normal respiration and symptoms resolved. Patient alert, oriented, and in no apparent distress at this time. ? ?EMS Vitals ?BP 132/90 ?HR 94 ?

## 2021-06-06 NOTE — ED Provider Triage Note (Signed)
Emergency Medicine Provider Triage Evaluation Note ? ?Victoria Bender , a 51 y.o. female  was evaluated in triage.  Pt complains of palpitations.  Patient states that today she began having palpitations and feeling like her heart was "fluttering."  She states that after the palpitations began she started having central chest pain and pain that goes down her left arm into her elbow.  States that she began having anxiety and then hyperventilating after this.  She states that she has had these similar symptoms before" people treat the anxiety but do not look at my heart which is the first thing to happen before the hyperventilation."  She states that these episodes last for about 15 to 20 minutes.  She is currently having chest pain but palpitations have stopped.  She had a similar episode on Tuesday which she states was associated with lightheadedness and dizziness however she was not transported to the emergency department by EMS.  She denies any nausea or diaphoresis with the episode. ?Review of Systems  ?Positive: See above ?Negative:  ? ?Physical Exam  ?BP 125/76 (BP Location: Left Arm)   Pulse 91   Temp 98.5 ?F (36.9 ?C) (Oral)   Resp 17   SpO2 100%  ?Gen:   Awake, no distress   ?Resp:  Normal effort, lungs clear bilaterally ?MSK:   Moves extremities without difficulty  ?Other:  S1/S2 without murmur, no tachycardia, pulse 2+ bilateral radial ? ?Medical Decision Making  ?Medically screening exam initiated at 3:00 PM.  Appropriate orders placed.  Victoria Bender was informed that the remainder of the evaluation will be completed by another provider, this initial triage assessment does not replace that evaluation, and the importance of remaining in the ED until their evaluation is complete. ? ? ?  ?Mickie Hillier, PA-C ?06/06/21 1505 ? ?

## 2021-06-10 ENCOUNTER — Encounter: Payer: Self-pay | Admitting: Emergency Medicine

## 2021-06-10 ENCOUNTER — Other Ambulatory Visit: Payer: Self-pay

## 2021-06-10 ENCOUNTER — Emergency Department: Payer: BC Managed Care – PPO

## 2021-06-10 ENCOUNTER — Emergency Department
Admission: EM | Admit: 2021-06-10 | Discharge: 2021-06-11 | Disposition: A | Discharge status: Home / Self Care | Payer: BC Managed Care – PPO | Attending: Emergency Medicine | Admitting: Emergency Medicine

## 2021-06-10 DIAGNOSIS — R079 Chest pain, unspecified: Secondary | ICD-10-CM | POA: Insufficient documentation

## 2021-06-10 DIAGNOSIS — R0602 Shortness of breath: Secondary | ICD-10-CM | POA: Insufficient documentation

## 2021-06-10 DIAGNOSIS — R002 Palpitations: Secondary | ICD-10-CM | POA: Diagnosis present

## 2021-06-10 DIAGNOSIS — Z20822 Contact with and (suspected) exposure to covid-19: Secondary | ICD-10-CM | POA: Diagnosis not present

## 2021-06-10 HISTORY — DX: Chest pain, unspecified: R07.9

## 2021-06-10 LAB — BASIC METABOLIC PANEL
Anion gap: 6 (ref 5–15)
BUN: 9 mg/dL (ref 6–20)
CO2: 25 mmol/L (ref 22–32)
Calcium: 9 mg/dL (ref 8.9–10.3)
Chloride: 106 mmol/L (ref 98–111)
Creatinine, Ser: 0.79 mg/dL (ref 0.44–1.00)
GFR, Estimated: >60 mL/min (ref >60)
Glucose, Bld: 97 mg/dL (ref 70–99)
Potassium: 3.6 mmol/L (ref 3.5–5.1)
Sodium: 137 mmol/L (ref 135–145)

## 2021-06-10 LAB — CBC
HCT: 31.7 % — ABNORMAL LOW (ref 36.0–46.0)
Hemoglobin: 9.6 g/dL — ABNORMAL LOW (ref 12.0–15.0)
MCH: 23.6 pg — ABNORMAL LOW (ref 26.0–34.0)
MCHC: 30.3 g/dL (ref 30.0–36.0)
MCV: 77.9 fL — ABNORMAL LOW (ref 80.0–100.0)
Platelets: 301 10*3/uL (ref 150–400)
RBC: 4.07 MIL/uL (ref 3.87–5.11)
RDW: 21.3 % — ABNORMAL HIGH (ref 11.5–15.5)
WBC: 6.4 10*3/uL (ref 4.0–10.5)
nRBC: 0 % (ref 0.0–0.2)

## 2021-06-10 LAB — T4, FREE: Free T4: 0.95 ng/dL (ref 0.61–1.12)

## 2021-06-10 LAB — RESP PANEL BY RT-PCR (FLU A&B, COVID) ARPGX2
Influenza A by PCR: NEGATIVE
Influenza B by PCR: NEGATIVE
SARS Coronavirus 2 by RT PCR: NEGATIVE

## 2021-06-10 LAB — TROPONIN I (HIGH SENSITIVITY)
Troponin I (High Sensitivity): 3 ng/L (ref <18)
Troponin I (High Sensitivity): 3 ng/L (ref <18)

## 2021-06-10 LAB — HEPATIC FUNCTION PANEL
ALT: 11 U/L (ref 0–44)
AST: 15 U/L (ref 15–41)
Albumin: 3.6 g/dL (ref 3.5–5.0)
Alkaline Phosphatase: 43 U/L (ref 38–126)
Bilirubin, Direct: <0.1 mg/dL (ref 0.0–0.2)
Total Bilirubin: 0.4 mg/dL (ref 0.3–1.2)
Total Protein: 6.9 g/dL (ref 6.5–8.1)

## 2021-06-10 LAB — LIPASE, BLOOD: Lipase: 29 U/L (ref 11–51)

## 2021-06-10 LAB — TSH: TSH: 2.749 u[IU]/mL (ref 0.350–4.500)

## 2021-06-10 LAB — D-DIMER, QUANTITATIVE: D-Dimer, Quant: 0.44 ug/mL-FEU (ref 0.00–0.50)

## 2021-06-10 LAB — POC URINE PREG, ED: Preg Test, Ur: NEGATIVE

## 2021-06-10 NOTE — ED Provider Notes (Signed)
? ?[facility] ?Provider Note ? ? ? Event Date/Time  ? First MD Initiated Contact with Patient 06/10/21 2018   ?  (approximate) ? ? ?History  ? ?Palpitations and Chest Pain ? ? ?HPI ? ?Victoria Bender is a 51 y.o. female who comes in with palpitations.  Patient reports intermittent palpitations for the past week.  They last for a few seconds and are associate with some shortness of breath and some stabbing sensation in her chest.  She denies any chest pain or palpitations at this time.  She denies any risk factors for pulmonary embolism.  Denies any pain in her calves.  She denies ever having this previously.  She went to [facility] on review of records but left before treatment was complete. ? ? ? ?Physical Exam  ? ?Triage Vital Signs: ?ED Triage Vitals [06/10/21 1956]  ?Enc Vitals Group  ?   BP 132/83  ?   Pulse Rate 91  ?   Resp 16  ?   Temp 98.7 ?F (37.1 ?C)  ?   Temp Source Oral  ?   SpO2 98 %  ?   Weight 240 lb (108.9 kg)  ?   Height '5\' 5"'$  (1.651 m)  ?   Head Circumference   ?   Peak Flow   ?   Pain Score 8  ?   Pain Loc   ?   Pain Edu?   ?   Excl. in [place]?   ? ? ?Most recent vital signs: ?Vitals:  ? 06/10/21 1956  ?BP: 132/83  ?Pulse: 91  ?Resp: 16  ?Temp: 98.7 ?F (37.1 ?C)  ?SpO2: 98%  ? ? ? ?General: Awake, no distress.  ?CV:  Good peripheral perfusion.  ?Resp:  Normal effort.  ?Abd:  No distention ?Other:  Calves are soft and nontender ? ? ?ED Results / Procedures / Treatments  ? ?Labs ?(all labs ordered are listed, but only abnormal results are displayed) ?Labs Reviewed  ?CBC - Abnormal; Notable for the following components:  ?    Result Value  ? Hemoglobin 9.6 (*)   ? HCT 31.7 (*)   ? MCV 77.9 (*)   ? MCH 23.6 (*)   ? RDW 21.3 (*)   ? All other components within normal limits  ?BASIC METABOLIC PANEL  ?POC URINE PREG, ED  ?TROPONIN I (HIGH SENSITIVITY)  ? ? ? ?EKG ? ?My interpretation of EKG: ? ?Normal sinus rate of 86 without any ST elevation or T wave inversions, normal  intervals ? ?RADIOLOGY ?I have reviewed the xray personally and she has a little bit of elevation of the left hemidiaphragm ? ?PROCEDURES: ? ?Critical Care performed: No ? ?.1-3 Lead EKG Interpretation ?Performed by: Vanessa McDonald, MD ?Authorized by: Vanessa [place], MD  ? ?  Interpretation: abnormal   ?  ECG rate:  90 ?  ECG rate assessment: normal   ?  Rhythm: sinus rhythm   ?  Ectopy: PAC   ?  Conduction: normal   ? ? ?MEDICATIONS ORDERED IN ED: ?Medications - No data to display ? ? ?IMPRESSION / MDM / ASSESSMENT AND PLAN / ED COURSE  ?I reviewed the triage vital signs and the nursing notes. ?             ?               ? ?Differential diagnosis includes, but is not limited to, ACS, PE, thyroid dysfunction, anxiety.  We  will get EKG, cardiac markers, thyroid testing, D-dimer, chest x-ray, COVID test to further evaluate. ? ?Pregnancy test was negative.  BMP reassuring.  Hemoglobin slightly low at 9.6 but around patient's baseline and she has known anemia.  Initial troponin is negative D-dimer was negative ? ?Reevaluated patient she has not had any additional palpitations since being here occasionally has some PACs on the monitor.  Discussed with patient that she has follow-up with cardiology for echocardiogram, Holter and further discussion.  This time she feels comfortable with this plan.  Patient will be handed off pending repeat troponin anticipate discharge home after that ? ?The patient is on the cardiac monitor to evaluate for evidence of arrhythmia and/or significant heart rate changes. ? ? ?FINAL CLINICAL IMPRESSION(S) / ED DIAGNOSES  ? ?Final diagnoses:  ?Palpitations  ? ? ? ?Rx / DC Orders  ? ?ED Discharge Orders   ? ? None  ? ?  ? ? ? ?Note:  This document was prepared using Dragon voice recognition software and may include unintentional dictation errors. ?  ?Vanessa , MD ?06/10/21 2311 ? ?

## 2021-06-10 NOTE — ED Triage Notes (Signed)
Pt to ED via POV with c/o L sided rib pain, pt states pain to L ribs is constant, however intermittently has worsening pain. Pt states approx 30 mins PTA pt had sudden sharp pain that radiated up her chest. Pt also c/o feeling like her heart is fluttering. Pt states has been seen for same several days ago and was told nothing is wrong.  ?

## 2021-06-10 NOTE — ED Notes (Signed)
Pt NAD in bed, a/ox4, speaking in full and complete sentences. Pt c/o intermittent left sided chest squeezing with radiation into left arm, SOB, and palpitations. Pt states she was evaluated 4 days ago for same but it persists. LS clear bilat, VSS ?

## 2021-06-10 NOTE — ED Notes (Signed)
Patient transported to X-ray 

## 2021-06-10 NOTE — Discharge Instructions (Addendum)
Please call the cardiology number to get follow-up to discuss your palpitations and return to the ER if you develop worsening symptoms or any other concerns ?

## 2021-06-27 ENCOUNTER — Other Ambulatory Visit: Payer: Self-pay

## 2021-06-27 DIAGNOSIS — D500 Iron deficiency anemia secondary to blood loss (chronic): Secondary | ICD-10-CM

## 2021-06-28 ENCOUNTER — Encounter: Payer: Self-pay | Admitting: Hematology

## 2021-06-28 ENCOUNTER — Inpatient Hospital Stay: Payer: BC Managed Care – PPO

## 2021-06-28 ENCOUNTER — Inpatient Hospital Stay: Payer: BC Managed Care – PPO | Attending: Hematology | Admitting: Hematology

## 2021-06-28 ENCOUNTER — Other Ambulatory Visit: Payer: Self-pay

## 2021-06-28 VITALS — BP 117/70 | HR 94 | Temp 97.8°F | Resp 18 | Ht 65.0 in | Wt 241.4 lb

## 2021-06-28 DIAGNOSIS — D500 Iron deficiency anemia secondary to blood loss (chronic): Secondary | ICD-10-CM

## 2021-06-28 DIAGNOSIS — N920 Excessive and frequent menstruation with regular cycle: Secondary | ICD-10-CM | POA: Insufficient documentation

## 2021-06-28 DIAGNOSIS — Z79899 Other long term (current) drug therapy: Secondary | ICD-10-CM | POA: Insufficient documentation

## 2021-06-28 DIAGNOSIS — G2581 Restless legs syndrome: Secondary | ICD-10-CM | POA: Diagnosis not present

## 2021-06-28 LAB — CMP (CANCER CENTER ONLY)
ALT: 6 U/L (ref 0–44)
AST: 8 U/L — ABNORMAL LOW (ref 15–41)
Albumin: 3.9 g/dL (ref 3.5–5.0)
Alkaline Phosphatase: 41 U/L (ref 38–126)
Anion gap: 6 (ref 5–15)
BUN: 5 mg/dL — ABNORMAL LOW (ref 6–20)
CO2: 24 mmol/L (ref 22–32)
Calcium: 9 mg/dL (ref 8.9–10.3)
Chloride: 108 mmol/L (ref 98–111)
Creatinine: 0.79 mg/dL (ref 0.44–1.00)
GFR, Estimated: >60 mL/min (ref >60)
Glucose, Bld: 114 mg/dL — ABNORMAL HIGH (ref 70–99)
Potassium: 3.8 mmol/L (ref 3.5–5.1)
Sodium: 138 mmol/L (ref 135–145)
Total Bilirubin: 0.4 mg/dL (ref 0.3–1.2)
Total Protein: 7.4 g/dL (ref 6.5–8.1)

## 2021-06-28 LAB — CBC WITH DIFFERENTIAL (CANCER CENTER ONLY)
Abs Immature Granulocytes: 0.03 10*3/uL (ref 0.00–0.07)
Basophils Absolute: 0 10*3/uL (ref 0.0–0.1)
Basophils Relative: 1 %
Eosinophils Absolute: 0.1 10*3/uL (ref 0.0–0.5)
Eosinophils Relative: 1 %
HCT: 30.8 % — ABNORMAL LOW (ref 36.0–46.0)
Hemoglobin: 9.4 g/dL — ABNORMAL LOW (ref 12.0–15.0)
Immature Granulocytes: 0 %
Lymphocytes Relative: 24 %
Lymphs Abs: 1.7 10*3/uL (ref 0.7–4.0)
MCH: 23.3 pg — ABNORMAL LOW (ref 26.0–34.0)
MCHC: 30.5 g/dL (ref 30.0–36.0)
MCV: 76.2 fL — ABNORMAL LOW (ref 80.0–100.0)
Monocytes Absolute: 0.4 10*3/uL (ref 0.1–1.0)
Monocytes Relative: 5 %
Neutro Abs: 4.9 10*3/uL (ref 1.7–7.7)
Neutrophils Relative %: 69 %
Platelet Count: 315 10*3/uL (ref 150–400)
RBC: 4.04 MIL/uL (ref 3.87–5.11)
RDW: 18.9 % — ABNORMAL HIGH (ref 11.5–15.5)
WBC Count: 7.1 10*3/uL (ref 4.0–10.5)
nRBC: 0 % (ref 0.0–0.2)

## 2021-06-28 LAB — FERRITIN: Ferritin: <4 ng/mL — ABNORMAL LOW (ref 11–307)

## 2021-06-28 LAB — IRON AND IRON BINDING CAPACITY (CC-WL,HP ONLY)
Iron: 21 ug/dL — ABNORMAL LOW (ref 28–170)
Saturation Ratios: 4 % — ABNORMAL LOW (ref 10.4–31.8)
TIBC: 522 ug/dL — ABNORMAL HIGH (ref 250–450)
UIBC: 501 ug/dL — ABNORMAL HIGH (ref 148–442)

## 2021-07-01 ENCOUNTER — Other Ambulatory Visit (HOSPITAL_COMMUNITY)
Admission: RE | Admit: 2021-07-01 | Discharge: 2021-07-01 | Disposition: A | Discharge status: Home / Self Care | Payer: BC Managed Care – PPO | Source: Ambulatory Visit | Attending: Obstetrics and Gynecology | Admitting: Obstetrics and Gynecology

## 2021-07-01 ENCOUNTER — Encounter: Payer: Self-pay | Admitting: Obstetrics and Gynecology

## 2021-07-01 ENCOUNTER — Ambulatory Visit (INDEPENDENT_AMBULATORY_CARE_PROVIDER_SITE_OTHER): Payer: BC Managed Care – PPO | Admitting: Obstetrics and Gynecology

## 2021-07-01 VITALS — BP 110/70 | HR 87 | Ht 64.5 in | Wt 239.0 lb

## 2021-07-01 DIAGNOSIS — Z23 Encounter for immunization: Secondary | ICD-10-CM | POA: Diagnosis not present

## 2021-07-01 DIAGNOSIS — Z119 Encounter for screening for infectious and parasitic diseases, unspecified: Secondary | ICD-10-CM | POA: Diagnosis present

## 2021-07-01 DIAGNOSIS — Z113 Encounter for screening for infections with a predominantly sexual mode of transmission: Secondary | ICD-10-CM | POA: Insufficient documentation

## 2021-07-01 DIAGNOSIS — Z01419 Encounter for gynecological examination (general) (routine) without abnormal findings: Secondary | ICD-10-CM

## 2021-07-01 DIAGNOSIS — Z124 Encounter for screening for malignant neoplasm of cervix: Secondary | ICD-10-CM | POA: Insufficient documentation

## 2021-07-01 DIAGNOSIS — R7309 Other abnormal glucose: Secondary | ICD-10-CM | POA: Diagnosis not present

## 2021-07-01 DIAGNOSIS — N920 Excessive and frequent menstruation with regular cycle: Secondary | ICD-10-CM

## 2021-07-01 HISTORY — PX: ENDOMETRIAL BIOPSY: SHX622

## 2021-07-01 NOTE — Patient Instructions (Signed)

## 2021-07-01 NOTE — Progress Notes (Signed)
51 y.o. [id number] Single African American female here for annual exam.   ? ?Menses are heavy always.  ?Monthly menses and last 5 - 6 days.  ?First day she gets out of bed carefully due to heavy bleeding.  ?Pad change every 2 hours.  ?Stains through her clothing.  ?Does not even want to go to work due to heavy bleeding ?No bleeding in between cycles. ?Cramps day prior to and then first 3 days.  ?Take Advil 400 mg 3 - 4 times during her period, and this works to control pain.  ? ?No prior evaluation. ?Hx fibroid in pregnancy. ? ?Declines future pregnancy.  ?Has 2 children and 1 adopted child.  ? ?Accepts STD testing today.  ? ?Has low iron and heart fluttering.  ?Sees hematology and does iron infusions.  ?Hgb 9.4 on 06/28/21. ?Last ferritin was <4 on 06/28/21. ? ?Normal TFTs 06/10/21. ? ?Sees hematology who recommended she pursue GYN care.  ? ?Mother lives with patient.  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  ? ?PCP:  Horald Pollen, MD   ? ?Patient's last menstrual period was 06/07/2021 (exact date).     ?Period Cycle (Days): 30 ?Period Duration (Days): 5-6 ?Period Pattern: Regular ?Menstrual Flow: Heavy ?Menstrual Control: Maxi pad, Tampon (over night pads on heaviest day) ?Menstrual Control Change Freq (Hours): changes overnight pad and super plus tampon every 2 hours on heaviest day ?Dysmenorrhea: (!) Moderate ?Dysmenorrhea Symptoms: Cramping ?    ?Sexually active: Yes.    ?The current method of family planning is tubal ligation.    ?Exercising: No.   Walking, jumping rope and hula hoop ?Smoker:  no ? ?Health Maintenance: ?Pap:  2-3 years ago normal per patient at [employer].   ?History of abnormal Pap:  no ?MMG:  2019 Neg/BiRads1 ?Colonoscopy:  08/2018 polyps removed;next 5 years ?BMD:   n/a  Result  n/a ?TDaP:  Unsure ?Gardasil:   no ?HIV:Neg years ago ?Hep C: Neg years ago ?Screening Labs:  PCP ? ? reports that she has never smoked. She has never used smokeless tobacco. She reports that she does not drink alcohol and  does not use drugs. ? ?Past Medical History:  ?Diagnosis Date  ? Allergic rhinitis   ? Chronic epigastric pain   ? recurrent discrete episodes  ? Fibroid   ? Iron deficiency anemia   ? Iron deficiency anemia due to chronic blood loss 07/05/2018  ? Panic attack as reaction to stress   ? Restless leg syndrome   ? ? ?Past Surgical History:  ?Procedure Laterality Date  ? CHOLECYSTECTOMY  2012  ? TUBAL LIGATION    ? UPPER GASTROINTESTINAL ENDOSCOPY  11/25/2010  ? NORMAL  ? ? ?Current Outpatient Medications  ?Medication Sig Dispense Refill  ? clonazePAM (KLONOPIN) 1 MG tablet Take 1 mg by mouth at bedtime.    ? ergocalciferol (VITAMIN D2) 1.25 MG (50000 UT) capsule Take by mouth.    ? ibuprofen (ADVIL) 200 MG tablet Take 200 mg by mouth every 6 (six) hours as needed for fever or mild pain.    ? ?Current Facility-Administered Medications  ?Medication Dose Route Frequency Provider Last Rate Last Admin  ? 0.9 %  sodium chloride infusion  500 mL Intravenous Once [name], MD      ? ? ?Family History  ?Problem Relation Age of Onset  ? Hypertension Mother   ? Hypertension Father   ? Colon polyps Father   ? Heart attack Brother   ? Colon polyps Brother   ?  adenomatous polyps  ? Pancreatic cancer Maternal Aunt   ? Diabetes Maternal Grandmother   ? Colon cancer Neg Hx   ? Esophageal cancer Neg Hx   ? Stomach cancer Neg Hx   ? Rectal cancer Neg Hx   ? ? ?Review of Systems  ?Genitourinary:  Positive for menstrual problem (heavy menstrual cycles).  ?All other systems reviewed and are negative. ? ?Exam:   ?BP 110/70   Pulse 87   Ht 5' 4.5" (1.638 m)   Wt 239 lb (108.4 kg)   LMP 06/07/2021 (Exact Date)   SpO2 99%   BMI 40.39 kg/m?     ?General appearance: alert, cooperative and appears stated age ?Head: normocephalic, without obvious abnormality, atraumatic ?Neck: no adenopathy, supple, symmetrical, trachea midline and thyroid normal to inspection and palpation ?Lungs: clear to auscultation bilaterally ?Breasts: normal  appearance, no masses or tenderness, No nipple retraction or dimpling, No nipple discharge or bleeding, No axillary adenopathy ?Heart: regular rate and rhythm ?Abdomen: soft, non-tender; no masses, no organomegaly ?Extremities: extremities normal, atraumatic, no cyanosis or edema ?Skin: skin color, texture, turgor normal. No rashes or lesions ?Lymph nodes: cervical, supraclavicular, and axillary nodes normal. ?Neurologic: grossly normal ? ?Pelvic: External genitalia:  no lesions ?             No abnormal inguinal nodes palpated. ?             Urethra:  normal appearing urethra with no masses, tenderness or lesions ?             Bartholins and Skenes: normal    ?             Vagina: normal appearing vagina with normal color and discharge, no lesions ?             Cervix: no lesions ?             Pap taken: yes ?Bimanual Exam:  Uterus: 14 week size, nontender ?             Adnexa: no mass, fullness, tenderness ?             Rectal exam: yes.  Confirms. ?             Anus:  normal sphincter tone, no lesions ? ?Chaperone was present for exam:  Estill Bamberg, CMA ? ?Assessment:   ?Well woman visit with gynecologic exam. ?Menorrhagia with regular menses. ?Enlarged uterus.  I suspect fibroids.  ?Anemia.  ?Doing iron infusions.  ?Hx fibroid in remote past. ? ?Plan: ?Mammogram screening discussed. ?Self breast awareness reviewed. ?Pap and HR HPV as above. ?Guidelines for Calcium, Vitamin D, regular exercise program including cardiovascular and weight bearing exercise. ?STD screening.  ?TDap.  ?Return for pelvic ultrasound and endometrial biopsy.  Rationale explained.  ?Follow up annually and prn.  ? ?After visit summary provided.  ? ? ? ?

## 2021-07-02 ENCOUNTER — Telehealth: Payer: Self-pay | Admitting: Hematology

## 2021-07-02 LAB — RPR: RPR Ser Ql: NONREACTIVE

## 2021-07-02 LAB — HEPATITIS C ANTIBODY
Hepatitis C Ab: NONREACTIVE
SIGNAL TO CUT-OFF: 0.15 (ref <1.00)

## 2021-07-02 LAB — HEMOGLOBIN A1C
Hgb A1c MFr Bld: 5.4 % of total Hgb (ref <5.7)
Mean Plasma Glucose: 108 mg/dL
eAG (mmol/L): 6 mmol/L

## 2021-07-02 LAB — HIV ANTIBODY (ROUTINE TESTING W REFLEX): HIV 1&2 Ab, 4th Generation: NONREACTIVE

## 2021-07-02 NOTE — Telephone Encounter (Signed)
Scheduled follow-up appointments per 3/31 los. Patient is aware. ?

## 2021-07-03 LAB — CYTOLOGY - PAP
Chlamydia: NEGATIVE
Comment: NEGATIVE
Comment: NEGATIVE
Comment: NEGATIVE
Comment: NORMAL
Diagnosis: NEGATIVE
High risk HPV: NEGATIVE
Neisseria Gonorrhea: NEGATIVE
Trichomonas: NEGATIVE

## 2021-07-05 ENCOUNTER — Encounter: Payer: Self-pay | Admitting: Hematology

## 2021-07-05 NOTE — Addendum Note (Signed)
Addended by: Sullivan Lone on: 07/05/2021 08:44 AM ? ? Modules accepted: Orders ? ?

## 2021-07-05 NOTE — Progress Notes (Signed)
. ? ? ?HEMATOLOGY/ONCOLOGY CONSULTATION NOTE ? ?Date of Service: 07/05/2021 ? ?Patient Care Team: ?Obgyn, Erling Conte ? ?CHIEF COMPLAINTS/PURPOSE OF CONSULTATION:  ?Follow-up for continued management of iron deficiency anemia ? ?HISTORY OF PRESENTING ILLNESS:  ? ?Victoria Bender is a wonderful 51 y.o. female who has been referred to [place] by Dr .No primary care provider on file. ? for evaluation and management of Iron deficiency anemia. ? ?Patient has history of allergic rhinitis, persistent asthma, deficiency anemia due to history of ulcers restless leg syndrome was previously seen by [place] this 20 2019 for iron deficiency anemia related to chronic blood loss from menorrhagia.  She received IV Injectafer x2 doses and was able to follow-up with [place] did not return to care.  She notes she has had some insurance issues that precluded appropriate medical follow-up. ? ?Patient notes she continues to have heavy periods.  Recent labs with her primary care physician on 02/01/2021 showed hemoglobin of 8.1 with an MCV of 70.9 with WBC count of 7.1k and platelets of 428k ?Ferritin 2.3 ? ?Patient notes no overt GI bleeding.  No hematochezia or melena.  No hematuria.  No gum bleeding.  No recent surgeries causing blood loss. ?Patient notes no issues with tolerance of previous IV iron infusion. ?Patient notes significant fatigue, recurrence of severe pica symptoms and craving for chewing ice, worsening of her restless leg syndrome. ?She is scheduled to receive IV iron provided consent to set this up.  She is aware about the pros and cons of IV iron infusion and would like to proceed. ? ?Interval history ?MEDICAL HISTORY:  ?Past Medical History:  ?Diagnosis Date  ? Allergic rhinitis   ? Chronic epigastric pain   ? recurrent discrete episodes  ? Fibroid   ? Iron deficiency anemia   ? Iron deficiency anemia due to chronic blood loss 07/05/2018  ? Panic attack as reaction to stress   ? Restless leg syndrome   ? ? ?SURGICAL HISTORY: ?Past  Surgical History:  ?Procedure Laterality Date  ? CHOLECYSTECTOMY  2012  ? TUBAL LIGATION    ? UPPER GASTROINTESTINAL ENDOSCOPY  11/25/2010  ? NORMAL  ? ? ?SOCIAL HISTORY: ?Social History  ? ?Socioeconomic History  ? Marital status: Single  ?  Spouse name: Not on file  ? Number of children: 2  ? Years of education: Not on file  ? Highest education level: Not on file  ?Occupational History  ? Not on file  ?Tobacco Use  ? Smoking status: Never  ? Smokeless tobacco: Never  ?Vaping Use  ? Vaping Use: Never used  ?Substance and Sexual Activity  ? Alcohol use: No  ?  Comment: social  ? Drug use: No  ? Sexual activity: Yes  ?  Birth control/protection: Surgical  ?  Comment: Tubal/first intercourse 18  ?Other Topics Concern  ? Not on file  ?Social History Narrative  ? Separated - living with her mom  ? 2 adult kids  ? Work - self-employed - crafts  ? Non-smoker, rare EtOH, no drugs  ? ?Social Determinants of Health  ? ?Financial Resource Strain: Not on file  ?Food Insecurity: Not on file  ?Transportation Needs: Not on file  ?Physical Activity: Not on file  ?Stress: Not on file  ?Social Connections: Not on file  ?Intimate Partner Violence: Not on file  ? ? ?FAMILY HISTORY: ?Family History  ?Problem Relation Age of Onset  ? Hypertension Mother   ? Hypertension Father   ? Colon polyps Father   ?  Heart attack Brother   ? Colon polyps Brother   ?     adenomatous polyps  ? Pancreatic cancer Maternal Aunt   ? Diabetes Maternal Grandmother   ? Colon cancer Neg Hx   ? Esophageal cancer Neg Hx   ? Stomach cancer Neg Hx   ? Rectal cancer Neg Hx   ? ? ?ALLERGIES:  has No Known Allergies. ? ?MEDICATIONS:  ?Current Outpatient Medications  ?Medication Sig Dispense Refill  ? clonazePAM (KLONOPIN) 1 MG tablet Take 1 mg by mouth at bedtime.    ? ergocalciferol (VITAMIN D2) 1.25 MG (50000 UT) capsule Take by mouth.    ? ibuprofen (ADVIL) 200 MG tablet Take 200 mg by mouth every 6 (six) hours as needed for fever or mild pain.    ? ?Current  Facility-Administered Medications  ?Medication Dose Route Frequency Provider Last Rate Last Admin  ? 0.9 %  sodium chloride infusion  500 mL Intravenous Once [name], MD      ? ? ?REVIEW OF SYSTEMS:   ? ?.10 Point review of Systems was done is negative except as noted above. ? ?PHYSICAL EXAMINATION: ?ECOG PERFORMANCE STATUS: 1 ? ?. ?Vitals:  ? 06/28/21 1340  ?BP: 117/70  ?Pulse: 94  ?Resp: 18  ?Temp: 97.8 ?F (36.6 ?C)  ?SpO2: 100%  ? ?Filed Weights  ? 06/28/21 1340  ?Weight: 241 lb 6.4 oz (109.5 kg)  ? ?.Body mass index is 40.17 kg/m?. ?. ?GENERAL:alert, in no acute distress and comfortable ?SKIN: no acute rashes, no significant lesions ?EYES: conjunctiva are pink and non-injected, sclera anicteric ?OROPHARYNX: MMM, no exudates, no oropharyngeal erythema or ulceration ?NECK: supple, no JVD ?LYMPH:  no palpable lymphadenopathy in the cervical, axillary or inguinal regions ?LUNGS: clear to auscultation b/l with normal respiratory effort ?HEART: regular rate & rhythm ?ABDOMEN:  normoactive bowel sounds , non tender, not distended. ?Extremity: no pedal edema ?PSYCH: alert & oriented x 3 with fluent speech ?NEURO: no focal motor/sensory deficits ? ? ?LABORATORY DATA:  ?I have reviewed the data as listed ? ?. ? ?  Latest Ref Rng & Units 06/28/2021  ? 12:25 PM 06/10/2021  ?  7:58 PM 06/06/2021  ?  3:08 PM  ?CBC  ?WBC 4.0 - 10.5 K/uL 7.1   6.4   6.1    ?Hemoglobin 12.0 - 15.0 g/dL 9.4   9.6   10.6    ?Hematocrit 36.0 - 46.0 % 30.8   31.7   35.2    ?Platelets 150 - 400 K/uL 315   301   283    ? ? ?. ? ?  Latest Ref Rng & Units 06/28/2021  ? 12:25 PM 06/10/2021  ?  7:58 PM 06/06/2021  ?  3:08 PM  ?CMP  ?Glucose 70 - 99 mg/dL 114   97   106    ?BUN 6 - 20 mg/dL '5   9   6    '$ ?Creatinine 0.44 - 1.00 mg/dL 0.79   0.79   0.82    ?Sodium 135 - 145 mmol/L 138   137   137    ?Potassium 3.5 - 5.1 mmol/L 3.8   3.6   3.7    ?Chloride 98 - 111 mmol/L 108   106   108    ?CO2 22 - 32 mmol/L '24   25   23    '$ ?Calcium 8.9 - 10.3 mg/dL 9.0    9.0   8.9    ?Total Protein 6.5 - 8.1 g/dL  7.4   6.9     ?Total Bilirubin 0.3 - 1.2 mg/dL 0.4   0.4     ?Alkaline Phos 38 - 126 U/L 41   43     ?AST 15 - 41 U/L 8   15     ?ALT 0 - 44 U/L 6   11     ? ?. ?Lab Results  ?Component Value Date  ? IRON 21 (L) 06/28/2021  ? TIBC 522 (H) 06/28/2021  ? IRONPCTSAT 4 (L) 06/28/2021  ? ?(Iron and TIBC) ? ?Lab Results  ?Component Value Date  ? FERRITIN <4 (L) 06/28/2021  ? ? ? ?RADIOGRAPHIC STUDIES: ?I have personally reviewed the radiological images as listed and agreed with the findings in the report. ?DG Chest 2 View ? ?Result Date: 06/10/2021 ?CLINICAL DATA:  Chest pain, left-sided rib pain EXAM: CHEST - 2 VIEW COMPARISON:  06/06/2021 FINDINGS: The heart size and mediastinal contours are within normal limits. Both lungs are clear. Unchanged elevation of the left hemidiaphragm. The visualized skeletal structures are unremarkable. IMPRESSION: No acute abnormality of the lungs. Unchanged elevation of the left hemidiaphragm. Electronically Signed   By: Delanna Ahmadi M.D.   On: 06/10/2021 20:57  ? ?DG Chest 2 View ? ?Result Date: 06/06/2021 ?CLINICAL DATA:  Chest pain. EXAM: CHEST - 2 VIEW COMPARISON:  Chest x-ray 10/01/2020. FINDINGS: There is stable mild elevation of the left hemidiaphragm. The heart size and mediastinal contours are within normal limits. Both lungs are clear. The visualized skeletal structures are unremarkable. IMPRESSION: No active cardiopulmonary disease. Electronically Signed   By: Ronney Asters M.D.   On: 06/06/2021 15:21   ? ?ASSESSMENT & PLAN:  ? ?51 year old female with history of recurrent iron deficiency anemia from menorrhagia with ? ?1) Symptomatic severe iron deficiency Anemia due to menorrhagia. ?2) menorrhagia -unknown etiology will need OB/GYN follow-up. ?3) PICA with severe ice craving due to iron deficiency ?4) severe restless leg syndrome due to iron deficiency anemia. ?PLAN ?-Discussed available labs in detail with the  patient. ?-Follow-up ?Labs today ?Repeat labs in 2 to 3 weeks to ensure that the hemoglobin is not dropping below 7 and needing PRBC transfusion. ?IV Venofer 300 mg weekly x4 doses ?RTC with Dr Irene Limbo with labs in 3 months ? ?All of the

## 2021-07-08 ENCOUNTER — Other Ambulatory Visit: Payer: Self-pay

## 2021-07-08 ENCOUNTER — Inpatient Hospital Stay: Payer: BC Managed Care – PPO | Attending: Hematology

## 2021-07-08 VITALS — BP 114/76 | HR 82 | Temp 98.3°F | Resp 16

## 2021-07-08 DIAGNOSIS — D500 Iron deficiency anemia secondary to blood loss (chronic): Secondary | ICD-10-CM | POA: Insufficient documentation

## 2021-07-08 DIAGNOSIS — N920 Excessive and frequent menstruation with regular cycle: Secondary | ICD-10-CM | POA: Insufficient documentation

## 2021-07-08 MED ORDER — SODIUM CHLORIDE 0.9 % IV SOLN
300.0000 mg | Freq: Once | INTRAVENOUS | Status: AC
  Administered 2021-07-08: 300 mg via INTRAVENOUS
  Filled 2021-07-08: qty 300

## 2021-07-08 MED ORDER — SODIUM CHLORIDE 0.9 % IV SOLN: Freq: Once | INTRAVENOUS | Status: AC

## 2021-07-08 MED ORDER — LORATADINE 10 MG PO TABS
10.0000 mg | ORAL_TABLET | Freq: Once | ORAL | Status: AC
  Administered 2021-07-08: 10 mg via ORAL
  Filled 2021-07-08: qty 1

## 2021-07-08 MED ORDER — ACETAMINOPHEN 325 MG PO TABS
650.0000 mg | ORAL_TABLET | Freq: Once | ORAL | Status: AC
  Administered 2021-07-08: 650 mg via ORAL
  Filled 2021-07-08: qty 2

## 2021-07-08 NOTE — Patient Instructions (Signed)

## 2021-07-08 NOTE — Progress Notes (Signed)
Pt declined to stay for 30 minute post Venofer infusion obs. ?Pt tolerated trtmt well w/out incident. ?VSS at discharge.  ?Ambulatory to lobby.   ?

## 2021-07-15 ENCOUNTER — Other Ambulatory Visit: Payer: Self-pay

## 2021-07-15 ENCOUNTER — Inpatient Hospital Stay: Payer: BC Managed Care – PPO

## 2021-07-15 VITALS — BP 112/68 | HR 87 | Temp 98.5°F | Resp 18

## 2021-07-15 DIAGNOSIS — D500 Iron deficiency anemia secondary to blood loss (chronic): Secondary | ICD-10-CM | POA: Diagnosis not present

## 2021-07-15 MED ORDER — LORATADINE 10 MG PO TABS
10.0000 mg | ORAL_TABLET | Freq: Once | ORAL | Status: AC
  Administered 2021-07-15: 10 mg via ORAL
  Filled 2021-07-15: qty 1

## 2021-07-15 MED ORDER — SODIUM CHLORIDE 0.9 % IV SOLN
300.0000 mg | Freq: Once | INTRAVENOUS | Status: AC
  Administered 2021-07-15: 300 mg via INTRAVENOUS
  Filled 2021-07-15: qty 300

## 2021-07-15 MED ORDER — SODIUM CHLORIDE 0.9 % IV SOLN: Freq: Once | INTRAVENOUS | Status: AC

## 2021-07-15 MED ORDER — ACETAMINOPHEN 325 MG PO TABS
650.0000 mg | ORAL_TABLET | Freq: Once | ORAL | Status: AC
  Administered 2021-07-15: 650 mg via ORAL
  Filled 2021-07-15: qty 2

## 2021-07-15 NOTE — Patient Instructions (Signed)

## 2021-07-15 NOTE — Progress Notes (Signed)
Pt refused 30 minute observation period post Venofer infusion. VSS. No complaints at time of discharge.  ?

## 2021-07-22 ENCOUNTER — Inpatient Hospital Stay: Payer: BC Managed Care – PPO

## 2021-07-22 VITALS — BP 111/66 | HR 83 | Temp 98.3°F | Resp 18

## 2021-07-22 DIAGNOSIS — D500 Iron deficiency anemia secondary to blood loss (chronic): Secondary | ICD-10-CM | POA: Diagnosis not present

## 2021-07-22 MED ORDER — SODIUM CHLORIDE 0.9 % IV SOLN: Freq: Once | INTRAVENOUS | Status: AC

## 2021-07-22 MED ORDER — LORATADINE 10 MG PO TABS
10.0000 mg | ORAL_TABLET | Freq: Once | ORAL | Status: AC
  Administered 2021-07-22: 10 mg via ORAL
  Filled 2021-07-22: qty 1

## 2021-07-22 MED ORDER — SODIUM CHLORIDE 0.9 % IV SOLN
300.0000 mg | Freq: Once | INTRAVENOUS | Status: AC
  Administered 2021-07-22: 300 mg via INTRAVENOUS
  Filled 2021-07-22: qty 300

## 2021-07-22 MED ORDER — ACETAMINOPHEN 325 MG PO TABS
650.0000 mg | ORAL_TABLET | Freq: Once | ORAL | Status: AC
  Administered 2021-07-22: 650 mg via ORAL
  Filled 2021-07-22: qty 2

## 2021-07-22 NOTE — Progress Notes (Signed)
Patient received her iron today with no issues. VSS- has had iron previously and does not wish to stay for her observation period. ?IV removed with tip intact. ?BP 111/66 (BP Location: Right Arm, Patient Position: Sitting)   Pulse 83   Temp 98.3 ?F (36.8 ?C) (Oral)   Resp 18   SpO2 100%  ? ?

## 2021-07-22 NOTE — Patient Instructions (Signed)

## 2021-07-22 NOTE — Progress Notes (Signed)
GYNECOLOGY  VISIT ?  ?HPI: ?51 y.o.   Single  African American  female   ?[id number] with Patient's last menstrual period was 07/03/2021 (exact date).   ?here for pelvic ultrasound for heavy menstrual bleeding and an enlarged uterus on examination.  ?She is interested in hysterectomy.  ? ?Hx uterine fibroid. ? ?Has painful periods.  ?No pain outside of her period.  ? ?Has anemia and does iron infusions. ?Has an infusion next week.  ? ?Declines future childbearing.  ? ?UPT negative.  ? ?GYNECOLOGIC HISTORY: ?Patient's last menstrual period was 07/03/2021 (exact date). ?Contraception:  tubal ?Menopausal hormone therapy:  none ?Last mammogram:   2019 Neg/BiRads1 ?Last pap smear:  07-01-21 Neg:Neg HR HPV, 2020 normal per patient at [facility]. ?       ?OB History   ? ? Gravida  ?3  ? Para  ?2  ? Term  ?2  ? Preterm  ?   ? AB  ?1  ? Living  ?2  ?  ? ? SAB  ?1  ? IAB  ?   ? Ectopic  ?   ? Multiple  ?   ? Live Births  ?   ?   ?  ?  ?    ? ?Patient Active Problem List  ? Diagnosis Date Noted  ? Iron deficiency anemia due to chronic blood loss - menorrhagia suspected as cause 07/05/2018  ? Family history of colonic polyps - brother 67's and father 52's 07/05/2018  ? ? ?Past Medical History:  ?Diagnosis Date  ? Allergic rhinitis   ? Chronic epigastric pain   ? recurrent discrete episodes  ? Fibroid   ? Iron deficiency anemia   ? Iron deficiency anemia due to chronic blood loss 07/05/2018  ? Panic attack as reaction to stress   ? Restless leg syndrome   ? ? ?Past Surgical History:  ?Procedure Laterality Date  ? CHOLECYSTECTOMY  2012  ? TUBAL LIGATION    ? UPPER GASTROINTESTINAL ENDOSCOPY  11/25/2010  ? NORMAL  ? ? ?Current Outpatient Medications  ?Medication Sig Dispense Refill  ? clonazePAM (KLONOPIN) 1 MG tablet Take 1 mg by mouth at bedtime.    ? ergocalciferol (VITAMIN D2) 1.25 MG (50000 UT) capsule Take by mouth.    ? ibuprofen (ADVIL) 200 MG tablet Take 200 mg by mouth every 6 (six) hours as needed for fever or mild pain.     ? ?Current Facility-Administered Medications  ?Medication Dose Route Frequency Provider Last Rate Last Admin  ? 0.9 %  sodium chloride infusion  500 mL Intravenous Once [name], MD      ?  ? ?ALLERGIES: Patient has no known allergies. ? ?Family History  ?Problem Relation Age of Onset  ? Hypertension Mother   ? Hypertension Father   ? Colon polyps Father   ? Heart attack Brother   ? Colon polyps Brother   ?     adenomatous polyps  ? Pancreatic cancer Maternal Aunt   ? Diabetes Maternal Grandmother   ? Colon cancer Neg Hx   ? Esophageal cancer Neg Hx   ? Stomach cancer Neg Hx   ? Rectal cancer Neg Hx   ? ? ?Social History  ? ?Socioeconomic History  ? Marital status: Single  ?  Spouse name: Not on file  ? Number of children: 2  ? Years of education: Not on file  ? Highest education level: Not on file  ?Occupational History  ? Not on file  ?  Tobacco Use  ? Smoking status: Never  ? Smokeless tobacco: Never  ?Vaping Use  ? Vaping Use: Never used  ?Substance and Sexual Activity  ? Alcohol use: No  ?  Comment: social  ? Drug use: No  ? Sexual activity: Yes  ?  Birth control/protection: Surgical  ?  Comment: Tubal/first intercourse 18  ?Other Topics Concern  ? Not on file  ?Social History Narrative  ? Separated - living with her mom  ? 2 adult kids  ? Work - self-employed - crafts  ? Non-smoker, rare EtOH, no drugs  ? ?Social Determinants of Health  ? ?Financial Resource Strain: Not on file  ?Food Insecurity: Not on file  ?Transportation Needs: Not on file  ?Physical Activity: Not on file  ?Stress: Not on file  ?Social Connections: Not on file  ?Intimate Partner Violence: Not on file  ? ? ?Review of Systems  ?All other systems reviewed and are negative. ? ?PHYSICAL EXAMINATION:   ? ?BP 122/74   Ht 5' 4.5" (1.638 m)   Wt 239 lb (108.4 kg)   LMP 07/03/2021 (Exact Date)   BMI 40.39 kg/m?     ?General appearance: alert, cooperative and appears stated age   ?Uterus 15 week size, mobile, nontender.  No adnexal masses  or tenderness.  ? ?Chaperone was present for exam:  Estill Bamberg, CMA ? ?Pelvic US  ?Uterus 16.70 x 10.02 x 11.86 cm.  ?Multiple subserosal fibroids 3.67 cm, 3.37 cm, 6.89 cm, 6.25 cm.  ?EMS 15.95 mm. ?Left ovary 2.4 x 1.62 x 1.73 cm.  ?Right ovary 238 x 1.53 x 2.13 cm.  ?No adnexal masses.  ?No free fluid. ? ?EMB ?Consent done.  ?Sterile prep. ?Paracervical block with 10 cc 1% lidocaine, lot [id number], expiration 07/30/22. ?Sterile prep with Hibiclens.  ?EMB to 8 cm x 2.  ?Tissue to pathology.  ?No complications.  ?Minimal EBL. ? ?ASSESSMENT ? ?Menorrhagia with regular menses.   ?Fibroids.  ?Anemia.  Doing iron infusions.  ? ?PLAN ? ?Pelvic US findings of fibroids dicussed.  ?FU EMB results.  ?Options for care reviewed:  Depo Provera, uterine artery embolization, GHRN agonists/antagonists, hysterectomy.  ? ?I discussed total laparoscopic hysterectomy with bilateral salpingectomy and possible bilateral oophorectomy, cystoscopy.   ? ?I reviewed risks, benefits, and alternatives.  Risks include but are not limited to bleeding, infection, damage to surrounding organs, pneumonia, reaction to anesthesia, DVT, PE, death, need for reoperation, hernia formation, neuropathy,and menopausal symptoms with increased risk of cardiovascular disease and osteoporosis if ovaries are removed.    ? ?Patient wishes to proceed. ? ?She will update her mammogram.  ?  ?An After Visit Summary was printed and given to the patient. ? ?30 min  total time was spent for this patient encounter, including preparation, face-to-face counseling with the patient, coordination of care, and documentation of the encounter. ? ? ? ? ?

## 2021-07-23 ENCOUNTER — Encounter: Payer: Self-pay | Admitting: Hematology

## 2021-07-25 ENCOUNTER — Ambulatory Visit (INDEPENDENT_AMBULATORY_CARE_PROVIDER_SITE_OTHER): Payer: BC Managed Care – PPO

## 2021-07-25 ENCOUNTER — Other Ambulatory Visit (HOSPITAL_COMMUNITY)
Admission: RE | Admit: 2021-07-25 | Discharge: 2021-07-25 | Disposition: A | Discharge status: Home / Self Care | Payer: BC Managed Care – PPO | Source: Ambulatory Visit | Attending: Obstetrics and Gynecology | Admitting: Obstetrics and Gynecology

## 2021-07-25 ENCOUNTER — Ambulatory Visit (INDEPENDENT_AMBULATORY_CARE_PROVIDER_SITE_OTHER): Payer: BC Managed Care – PPO | Admitting: Obstetrics and Gynecology

## 2021-07-25 ENCOUNTER — Other Ambulatory Visit: Payer: Self-pay | Admitting: Obstetrics and Gynecology

## 2021-07-25 ENCOUNTER — Encounter: Payer: Self-pay | Admitting: Obstetrics and Gynecology

## 2021-07-25 VITALS — BP 122/74 | Ht 64.5 in | Wt 239.0 lb

## 2021-07-25 DIAGNOSIS — N920 Excessive and frequent menstruation with regular cycle: Secondary | ICD-10-CM

## 2021-07-25 DIAGNOSIS — D252 Subserosal leiomyoma of uterus: Secondary | ICD-10-CM | POA: Diagnosis not present

## 2021-07-25 LAB — PREGNANCY, URINE: Preg Test, Ur: NEGATIVE

## 2021-07-25 NOTE — Patient Instructions (Signed)
Total Laparoscopic Hysterectomy ?A total laparoscopic hysterectomy is a minimally invasive surgery to remove the uterus and cervix. The fallopian tubes and ovaries can also be removed during this surgery, if necessary. This procedure may be done to treat problems such as: ?Growths in the uterus (uterine fibroids) that are not cancer but cause symptoms. ?A condition that causes the lining of the uterus to grow in other areas (endometriosis). ?Problems with pelvic support. ?Cancer of the cervix, ovaries, uterus, or tissue that lines the uterus (endometrium). ?Excessive bleeding in the uterus. ?After this procedure, you will no longer be able to have a baby, and you will no longer have a menstrual period. ?Tell a health care provider about: ?Any allergies you have. ?All medicines you are taking, including vitamins, herbs, eye drops, creams, and over-the-counter medicines. ?Any problems you or family members have had with anesthetic medicines. ?Any blood disorders you have. ?Any surgeries you have had. ?Any medical conditions you have. ?Whether you are pregnant or may be pregnant. ?What are the risks? ?Generally, this is a safe procedure. However, problems may occur, including: ?Infection. ?Bleeding. ?Blood clots in the legs or lungs. ?Allergic reactions to medicines. ?Damage to nearby structures or organs. ?Having to change from this surgery to one in which a large incision is made in the abdomen (abdominal hysterectomy). ?What happens before the procedure? ?Staying hydrated ?Follow instructions from your health care provider about hydration, which may include: ?Up to 2 hours before the procedure - you may continue to drink clear liquids, such as water, clear fruit juice, black coffee, and plain tea. ? ?Eating and drinking restrictions ?Follow instructions from your health care provider about eating and drinking, which may include: ?8 hours before the procedure - stop eating heavy meals or foods, such as meat, fried  foods, or fatty foods. ?6 hours before the procedure - stop eating light meals or foods, such as toast or cereal. ?6 hours before the procedure - stop drinking milk or drinks that contain milk. ?2 hours before the procedure - stop drinking clear liquids. ?Medicines ?Ask your health care provider about: ?Changing or stopping your regular medicines. This is especially important if you are taking diabetes medicines or blood thinners. ?Taking medicines such as aspirin and ibuprofen. These medicines can thin your blood. Do not take these medicines unless your health care provider tells you to take them. ?Taking over-the-counter medicines, vitamins, herbs, and supplements. ?You may be asked to take medicine that helps you have a bowel movement (laxative) to prevent constipation. ?General instructions ?If you were asked to do bowel preparation before the procedure, follow instructions from your health care provider. ?This procedure can affect the way you feel about yourself. Talk with your health care provider about the physical and emotional changes hysterectomy may cause. ?Do not use any products that contain nicotine or tobacco for at least 4 weeks before the procedure. These products include cigarettes, chewing tobacco, and vaping devices, such as e-cigarettes. If you need help quitting, ask your health care provider. ?Plan to have a responsible adult take you home from the hospital or clinic. ?Plan to have a responsible adult care for you for the time you are told after you leave the hospital or clinic. This is important. ?Surgery safety ?Ask your health care provider: ?How your surgery site will be marked. ?What steps will be taken to help prevent infection. These may include: ?Removing hair at the surgery site. ?Washing skin with a germ-killing soap. ?Receiving antibiotic medicine. ?What happens during the  procedure? ?An IV will be inserted into one of your veins. ?You will be given one or more of the following: ?A  medicine to help you relax (sedative). ?A medicine to make you fall asleep (general anesthetic). ?A medicine to numb the area (local anesthetic). ?A medicine that is injected into your spine to numb the area below and slightly above the injection site (spinal anesthetic). ?A medicine that is injected into an area of your body to numb everything below the injection site (regional anesthetic). ?A gas will be used to inflate your abdomen. This will allow your surgeon to look inside your abdomen and do the surgery. ?Three or four small incisions will be made in your abdomen. ?A small device with a light (laparoscope) will be inserted into one of your incisions. Surgical instruments will be inserted through the other incisions in order to perform the procedure. ?Your uterus and cervix may be removed through your vagina or cut into small pieces and removed through the small incisions. Any other organs that need to be removed will also be removed this way. ?The gas will be released from inside your abdomen. ?Your incisions will be closed with stitches (sutures), skin glue, or adhesive strips. ?A bandage (dressing) may be placed over your incisions. ?The procedure may vary among health care providers and hospitals. ?What happens after the procedure? ?Your blood pressure, heart rate, breathing rate, and blood oxygen level will be monitored until you leave the hospital or clinic. ?You will be given medicine for pain as needed. ?You will be encouraged to walk as soon as possible. You will also use a device to help you breathe or do breathing exercises to keep your lungs clear. ?You may have to wear compression stockings. These stockings help to prevent blood clots and reduce swelling in your legs. ?You will need to wear a sanitary pad for vaginal discharge or bleeding. ?Summary ?Total laparoscopic hysterectomy is a procedure to remove your uterus, cervix, and sometimes the fallopian tubes and ovaries. ?This procedure can  affect the way you feel about yourself. Talk with your health care provider about the physical and emotional changes hysterectomy may cause. ?After this procedure, you will no longer be able to have a baby, and you will no longer have a menstrual period. ?You will be given pain medicine to control discomfort after this procedure. ?Plan to have a responsible adult take you home from the hospital or clinic. ?This information is not intended to replace advice given to you by your health care provider. Make sure you discuss any questions you have with your health care provider. ?Document Revised: 11/18/2019 Document Reviewed: 11/18/2019 ?Elsevier Patient Education ? [place]. ? ? ?Uterine Fibroids ? ?Uterine fibroids, also called leiomyomas, are noncancerous (benign) tumors that can grow in the uterus. They can cause heavy menstrual bleeding and pain. Fibroids may also grow in the fallopian tubes, cervix, or tissues (ligaments) near the uterus. ?You may have one or many fibroids. Fibroids vary in size, weight, and where they grow in the uterus. Some can become quite large. Most fibroids do not require medical treatment. ?What are the causes? ?The cause of this condition is not known. ?What increases the risk? ?You are more likely to develop this condition if you: ?Are in your 30s or 40s and have not gone through menopause. ?Have a family history of this condition. ?Are of African American descent. ?Started your menstrual period at age 71 or younger. ?Have never given birth. ?Are overweight or obese. ?  What are the signs or symptoms? ?Many women do not have any symptoms. Symptoms of this condition may include: ?Heavy menstrual bleeding. ?Bleeding between menstrual periods. ?Pain and pressure in the pelvic area, between your hip bones. ?Pain during sex. ?Bladder problems, such as needing to urinate right away or more often than usual. ?Inability to have children (infertility). ?Failure to carry pregnancy to term  (miscarriage). ?How is this diagnosed? ?This condition may be diagnosed based on: ?Your symptoms and medical history. ?A physical exam. ?A pelvic exam that includes feeling for any tumors. ?Imaging tests, suc

## 2021-07-25 NOTE — Addendum Note (Signed)
Addended by: Yisroel Ramming, Dietrich Pates E on: 07/25/2021 02:09 PM ? ? Modules accepted: Orders ? ?

## 2021-07-29 ENCOUNTER — Inpatient Hospital Stay: Payer: BC Managed Care – PPO | Attending: Hematology

## 2021-07-29 VITALS — BP 112/69 | HR 76 | Temp 98.3°F | Resp 16

## 2021-07-29 DIAGNOSIS — D500 Iron deficiency anemia secondary to blood loss (chronic): Secondary | ICD-10-CM | POA: Insufficient documentation

## 2021-07-29 DIAGNOSIS — Z79899 Other long term (current) drug therapy: Secondary | ICD-10-CM | POA: Insufficient documentation

## 2021-07-29 DIAGNOSIS — N920 Excessive and frequent menstruation with regular cycle: Secondary | ICD-10-CM | POA: Insufficient documentation

## 2021-07-29 LAB — SURGICAL PATHOLOGY

## 2021-07-29 MED ORDER — SODIUM CHLORIDE 0.9 % IV SOLN
300.0000 mg | Freq: Once | INTRAVENOUS | Status: AC
  Administered 2021-07-29: 300 mg via INTRAVENOUS
  Filled 2021-07-29: qty 300

## 2021-07-29 MED ORDER — SODIUM CHLORIDE 0.9 % IV SOLN: Freq: Once | INTRAVENOUS | Status: AC

## 2021-07-29 NOTE — Patient Instructions (Signed)

## 2021-07-29 NOTE — Progress Notes (Signed)
Pt declined to stay for post 30 obs, discharged VSS, ambulatory to lobby. 

## 2021-07-30 ENCOUNTER — Telehealth: Payer: Self-pay | Admitting: Obstetrics and Gynecology

## 2021-07-30 NOTE — Telephone Encounter (Signed)
Please precert and schedule a total laparoscopic hysterectomy, bilateral salpingectomy, possible bilateral oophorectomy, cystoscopy, and vaginal morcellation of uterus in Alexis bag.  ? ?My patient has menorrhagia with regular menses, fibroids and anemia.  ? ?Location:  [place] ? ?Time needed:  3.5 hours ? ?Assistant needed:  yes  ? ?Preop needed:  yes ? ? ?

## 2021-07-31 NOTE — Telephone Encounter (Signed)
Spoke with patient regarding surgery benefits. Patient acknowledges understanding of information presented. Patient is aware that benefits presented are professional benefits only. Patient is aware the hospital will call with facility benefits. See account note.  Routing to Jill Hamm, RN.  

## 2021-08-01 NOTE — Telephone Encounter (Signed)
I do recommend a preop visit with me. ?

## 2021-08-01 NOTE — Telephone Encounter (Signed)
Spoke with patient. Reviewed surgery dates. Patient request to proceed with surgery on 08/20/21.   I will return call once surgery date and time confirmed. Patient verbalizes understanding and is agreeable.  ? ?Surgery request sent.  ? ? ?Dr. Quincy Simmonds -is additional pre-op needed? AEX 07/25/21 ?

## 2021-08-02 NOTE — Progress Notes (Signed)
GYNECOLOGY  VISIT ?  ?HPI: ?51 y.o.   Single  African American  female   ?[id number] with Patient's last menstrual period was 07/31/2021.   ?here for pre-op visit for a TLH w/ salpingectomy, possible oophorectomy, vaginal morcellation of uterus in alexis bag scheduled on 08/20/21.  ? ?Patient has fibroids with painful and heavy periods.  ?Her uterus is 15 - 16 week size with multiple fibroids, and her ovaries are normal.  ?Her endometrial biopsy showed benign secretory endometrium.  ? ?Her last HGB was 9.4 on 06/28/21. ?Her last iron infusion was 07/25/21. ? ?She has had a tubal and declines future childbearing.  ? ?No change in medical status since her last office visit, other than the iron transfusion.  ? ?GYNECOLOGIC HISTORY: ?Patient's last menstrual period was 07/31/2021. ?Contraception: BTL ?Menopausal hormone therapy: none ?Last mammogram: 01/05/2018-neg birads 1 ?Last pap smear: 07/01/21-WNL, HPV- neg; 2-3 years before WNL per pt @ [facility]  ?       ?OB History   ? ? Gravida  ?3  ? Para  ?2  ? Term  ?2  ? Preterm  ?   ? AB  ?1  ? Living  ?2  ?  ? ? SAB  ?1  ? IAB  ?   ? Ectopic  ?   ? Multiple  ?   ? Live Births  ?   ?   ?  ?  ?    ? ?Patient Active Problem List  ? Diagnosis Date Noted  ? Iron deficiency anemia due to chronic blood loss - menorrhagia suspected as cause 07/05/2018  ? Family history of colonic polyps - brother 52's and father 42's 07/05/2018  ? ? ?Past Medical History:  ?Diagnosis Date  ? Allergic rhinitis   ? Anxiety   ? Chronic epigastric pain   ? recurrent discrete episodes  ? Fibroid   ? Iron deficiency anemia   ? Iron deficiency anemia due to chronic blood loss 07/05/2018  ? Panic attack as reaction to stress   ? Restless leg syndrome   ? ? ?Past Surgical History:  ?Procedure Laterality Date  ? CHOLECYSTECTOMY  2012  ? TUBAL LIGATION    ? UPPER GASTROINTESTINAL ENDOSCOPY  11/25/2010  ? NORMAL  ? ? ?Current Outpatient Medications  ?Medication Sig Dispense Refill  ? clonazePAM (KLONOPIN) 1 MG  tablet Take 1 mg by mouth at bedtime.    ? ergocalciferol (VITAMIN D2) 1.25 MG (50000 UT) capsule Take by mouth.    ? ibuprofen (ADVIL) 200 MG tablet Take 200 mg by mouth every 6 (six) hours as needed for fever or mild pain.    ? ?Current Facility-Administered Medications  ?Medication Dose Route Frequency Provider Last Rate Last Admin  ? 0.9 %  sodium chloride infusion  500 mL Intravenous Once [name], MD      ?  ? ?ALLERGIES: Oxycodone ? ?Family History  ?Problem Relation Age of Onset  ? Hypertension Mother   ? Hypertension Father   ? Colon polyps Father   ? Heart attack Brother   ? Colon polyps Brother   ?     adenomatous polyps  ? Pancreatic cancer Maternal Aunt   ? Diabetes Maternal Grandmother   ? Colon cancer Neg Hx   ? Esophageal cancer Neg Hx   ? Stomach cancer Neg Hx   ? Rectal cancer Neg Hx   ? ? ?Social History  ? ?Socioeconomic History  ? Marital status: Single  ?  Spouse  name: Not on file  ? Number of children: 2  ? Years of education: Not on file  ? Highest education level: Not on file  ?Occupational History  ? Not on file  ?Tobacco Use  ? Smoking status: Never  ? Smokeless tobacco: Never  ?Vaping Use  ? Vaping Use: Never used  ?Substance and Sexual Activity  ? Alcohol use: No  ?  Comment: social  ? Drug use: No  ? Sexual activity: Yes  ?  Birth control/protection: Surgical  ?  Comment: Tubal/first intercourse 18  ?Other Topics Concern  ? Not on file  ?Social History Narrative  ? Separated - living with her mom  ? 2 adult kids  ? Work - self-employed - crafts  ? Non-smoker, rare EtOH, no drugs  ? ?Social Determinants of Health  ? ?Financial Resource Strain: Not on file  ?Food Insecurity: Not on file  ?Transportation Needs: Not on file  ?Physical Activity: Not on file  ?Stress: Not on file  ?Social Connections: Not on file  ?Intimate Partner Violence: Not on file  ? ? ?Review of Systems  ?All other systems reviewed and are negative. ? ?PHYSICAL EXAMINATION:   ? ?BP 104/72   Pulse 80   Resp (!)  98   LMP 07/31/2021     ?General appearance: alert, cooperative and appears stated age ?Head: Normocephalic, without obvious abnormality, atraumatic ?Neck: no adenopathy, supple, symmetrical, trachea midline and thyroid normal to inspection and palpation ?Lungs: clear to auscultation bilaterally ?Heart: regular rate and rhythm ?Abdomen: soft, non-tender, no masses,  no organomegaly ?Extremities: extremities normal, atraumatic, no cyanosis or edema ?No abnormal inguinal nodes palpated ?Neurologic: Grossly normal ? ?Pelvic:  Deferred.  ? ?ASSESSMENT ? ?Status post BTL.  ?Uterine fibroids.  ?Anemia. ? ?PLAN ? ?Will proceed with total laparoscopic hysterectomy/bilateral salpingectomy/possible bilateral oophorectomy/vaginal morcellation of uterus in Alexis bag.  ?Risks, benefits, and alternatives have been reviewed with the patient who wishes to proceed.  ?I did discuss additional risks surgery to include conversion to laparotomy, vaginal cuff dehiscence, and removal of ovaries if abnormal or bleed heavily.  ?She wishes to proceed.  ?Surgical expectations and recovery reviewed.  ? ?She will update her mammogram.  ? ?CBC today. ?  ?An After Visit Summary was printed and given to the patient. ? ? ? ?

## 2021-08-02 NOTE — Telephone Encounter (Signed)
Spoke with patient. Surgery date request confirmed.  ?Advised surgery is scheduled for 08/20/21, [facility] at 0730.  ?Surgery instruction sheet and hospital brochure reviewed, printed copy will be mailed.  ?Patient verbalizes understanding and is agreeable.  ?Routing to provider. Encounter closed. ? ?Cc: Hayley Carder ? ?

## 2021-08-12 ENCOUNTER — Other Ambulatory Visit: Payer: Self-pay | Admitting: Obstetrics and Gynecology

## 2021-08-12 ENCOUNTER — Ambulatory Visit (INDEPENDENT_AMBULATORY_CARE_PROVIDER_SITE_OTHER): Payer: BC Managed Care – PPO | Admitting: Obstetrics and Gynecology

## 2021-08-12 ENCOUNTER — Encounter: Payer: Self-pay | Admitting: Obstetrics and Gynecology

## 2021-08-12 VITALS — BP 104/72 | HR 80 | Resp 98

## 2021-08-12 DIAGNOSIS — D649 Anemia, unspecified: Secondary | ICD-10-CM | POA: Diagnosis not present

## 2021-08-12 DIAGNOSIS — Z1231 Encounter for screening mammogram for malignant neoplasm of breast: Secondary | ICD-10-CM

## 2021-08-12 DIAGNOSIS — D219 Benign neoplasm of connective and other soft tissue, unspecified: Secondary | ICD-10-CM

## 2021-08-12 LAB — CBC
HCT: 36.7 % (ref 35.0–45.0)
Hemoglobin: 11.4 g/dL — ABNORMAL LOW (ref 11.7–15.5)
MCH: 25.8 pg — ABNORMAL LOW (ref 27.0–33.0)
MCHC: 31.1 g/dL — ABNORMAL LOW (ref 32.0–36.0)
MCV: 83 fL (ref 80.0–100.0)
MPV: 12 fL (ref 7.5–12.5)
Platelets: 297 10*3/uL (ref 140–400)
RBC: 4.42 10*6/uL (ref 3.80–5.10)
RDW: 19.9 % — ABNORMAL HIGH (ref 11.0–15.0)
WBC: 6.1 10*3/uL (ref 3.8–10.8)

## 2021-08-13 ENCOUNTER — Encounter (HOSPITAL_BASED_OUTPATIENT_CLINIC_OR_DEPARTMENT_OTHER): Payer: Self-pay | Admitting: Obstetrics and Gynecology

## 2021-08-13 ENCOUNTER — Other Ambulatory Visit: Payer: Self-pay

## 2021-08-13 DIAGNOSIS — Z01812 Encounter for preprocedural laboratory examination: Secondary | ICD-10-CM | POA: Diagnosis present

## 2021-08-13 DIAGNOSIS — D219 Benign neoplasm of connective and other soft tissue, unspecified: Secondary | ICD-10-CM | POA: Diagnosis not present

## 2021-08-13 NOTE — Progress Notes (Addendum)
Spoke w/ via phone for pre-op interview---Victoria Bender ?Lab needs dos---- urine pregnancy POCT per anesthesia, surgeon orders pending as of 08/13/21             ?Lab results------08/16/21 lab appt for CBC, type & screen, 08/12/21 CBC in Epic, 06/10/21 Cxray & EKG in Epic & chart, 07/16/21 Long Term heart monitor in Care Everywhere ?COVID test -----patient states asymptomatic no test needed ?Arrive at -------0530 on Tuesday, 08/20/21 ?NPO after MN NO Solid Food.  Clear liquids from MN until---0430 ?Med rec completed ?Medications to take morning of surgery -----NONE ?Diabetic medication -----n/a ?Patient instructed no nail polish to be worn day of surgery ?Patient instructed to bring photo id and insurance card day of surgery ?Patient aware to have Driver (ride ) / caregiver    for 24 hours after surgery - son, Set designer (driver), caregiver (mother) ?Patient Special Instructions -----Extended stay instructions given. Patient aware that if she wears contact lenses on day of surgery, they will have to be taken out prior to OR. Instructed patient to wear glasses if possible. ?Pre-Op special Istructions -----Requested orders from Dr. Quincy Simmonds on 08/13/21 via Epic IB. ?Patient verbalized understanding of instructions that were given at this phone interview. ?Patient denies shortness of breath, chest pain, fever, cough at this phone interview.  ? ?

## 2021-08-13 NOTE — Progress Notes (Signed)
? ? Your procedure is scheduled on Tuesday, 08/20/21. ? Report to [facility] ? Call this number if you have problems the morning of surgery  [phone]. ? ? [place] [facility] [place].  WE ARE LOCATED IN THE [facility]. ? ?PLEASE BRING YOUR INSURANCE CARD AND PHOTO ID DAY OF SURGERY. ? ?ONLY 2 PEOPLE ARE ALLOWED IN  WAITING  ROOM.  ?                                   ? REMEMBER: ? DO NOT EAT FOOD, CANDY GUM OR MINTS  AFTER MIDNIGHT THE NIGHT BEFORE YOUR SURGERY . YOU MAY HAVE CLEAR LIQUIDS FROM MIDNIGHT THE NIGHT BEFORE YOUR SURGERY UNTIL  4:30 AM. NO CLEAR LIQUIDS AFTER  4:30 AM DAY OF SURGERY. ? ?YOU MAY  BRUSH YOUR TEETH MORNING OF SURGERY AND RINSE YOUR MOUTH OUT, NO CHEWING GUM CANDY OR MINTS. ? ? ? ? ?CLEAR LIQUID DIET ? ? ?Foods Allowed                                                                     Foods Excluded ? ?Coffee and tea, regular and decaf                             liquids that you cannot  ?Plain Jell-O                                                                   see through such as: ?Fruit ices (not with fruit pulp)                                     milk, soups, orange juice  ?Plain  Popsicles                                    All solid food ?Carbonated beverages, regular and diet                                    ?Cranberry, grape and apple juices ?Sports drinks like Gatorade ?_____________________________________________________________________ ?  ? ? TAKE THESE MEDICATIONS MORNING OF SURGERY: ?DO NOT TAKE ANY MEDICATIONS OR VITAMINS ON THE MORNING OF SURGERY. ? ? UP TO 4 VISITORS  MAY VISIT IN THE EXTENDED RECOVERY ROOM UNTIL 800 PM ONLY.  1 VISITOR AGE 33 AND OVER MAY SPEND THE NIGHT AND MUST BE IN EXTENDED RECOVERY ROOM NO LATER THAN 800 PM . YOUR DISCHARGE TIME AFTER YOU SPEND THE NIGHT IS 900 AM THE MORNING AFTER YOUR SURGERY. ? ?YOU MAY PACK A SMALL OVERNIGHT BAG  WITH TOILETRIES FOR YOUR OVERNIGHT STAY IF YOU  WISH. ? ?YOUR PRESCRIPTION MEDICATIONS WILL BE PROVIDED DURING [facility]. ? ? ?                                   ?DO NOT WEAR JEWERLY, MAKE UP. ?DO NOT WEAR LOTIONS, POWDERS, PERFUMES OR NAIL POLISH ON YOUR FINGERNAILS. TOENAIL POLISH IS OK TO WEAR. ?DO NOT SHAVE FOR 48 HOURS PRIOR TO DAY OF SURGERY. ?MEN MAY SHAVE FACE AND NECK. ?CONTACTS, GLASSES, OR DENTURES MAY NOT BE WORN TO SURGERY. ? ?REMEMBER: NO SMOKING, DRUGS OR ALCOHOL FOR 24 HOURS BEFORE YOUR SURGERY. ?                                   ?[facility] IS NOT RESPONSIBLE  FOR ANY BELONGINGS.                                  ?                                  . ?          [facility] - Preparing for Surgery ?Before surgery, you can play an important role.  Because skin is not sterile, your skin needs to be as free of germs as possible.  You can reduce the number of germs on your skin by washing with CHG (chlorahexidine gluconate) soap before surgery.  CHG is an antiseptic cleaner which kills germs and bonds with the skin to continue killing germs even after washing. ?Please DO NOT use if you have an allergy to CHG or antibacterial soaps.  If your skin becomes reddened/irritated stop using the CHG and inform your nurse when you arrive at Short Stay. ?Do not shave (including legs and underarms) for at least 48 hours prior to the first CHG shower.  You may shave your face/neck. ?Please follow these instructions carefully: ? 1.  Shower with CHG Soap the night before surgery and the  morning of Surgery. ? 2.  If you choose to wash your hair, wash your hair first as usual with your  normal  shampoo. ? 3.  After you shampoo, rinse your hair and body thoroughly to remove the  shampoo.                            ?4.  Use CHG as you would any other liquid soap.  You can apply chg directly  to the skin and wash , please wash your belly button thoroughly with chg soap provided night before and morning of your surgery. ?                    Gently with a  scrungie or clean washcloth. ? 5.  Apply the CHG Soap to your body ONLY FROM THE NECK DOWN.   Do not use on face/ open      ?                     Wound or open sores. Avoid contact with eyes, ears mouth and genitals (private parts).  ?  Wash face,  Genitals (private parts) with your normal soap. ?            6.  Wash thoroughly, paying special attention to the area where your surgery  will be performed. ? 7.  Thoroughly rinse your body with warm water from the neck down. ? 8.  DO NOT shower/wash with your normal soap after using and rinsing off  the CHG Soap. ?               9.  Pat yourself dry with a clean towel. ?           10.  Wear clean pajamas. ?           11.  Place clean sheets on your bed the night of your first shower and do not  sleep with pets. ?Day of Surgery : ?Do not apply any lotions/deodorants the morning of surgery.  Please wear clean clothes to the hospital/surgery center. ? ?IF YOU HAVE ANY SKIN IRRITATION OR PROBLEMS WITH THE SURGICAL SOAP, PLEASE GET A BAR OF GOLD DIAL SOAP AND SHOWER THE NIGHT BEFORE YOUR SURGERY AND THE MORNING OF YOUR SURGERY. PLEASE LET THE NURSE KNOW MORNING OF YOUR SURGERY IF YOU HAD ANY PROBLEMS WITH THE SURGICAL SOAP. ? ? ?________________________________________________________________________                  ?                                    ?  QUESTIONS CALL Waniya Hoglund PRE OP NURSE PHONE [phone].                                    ?

## 2021-08-14 ENCOUNTER — Telehealth: Payer: Self-pay | Admitting: *Deleted

## 2021-08-14 NOTE — Telephone Encounter (Signed)
Patient left message stating she received a Jury Duty summons for 09/02/21. She is scheduled for TLH on 5/23. She is scheduled for 1wk post-op on 5/31. She is requesting a letter to provide for [employer].  ? ?Dr. Quincy Simmonds -please advise on any restrictions.  ?

## 2021-08-14 NOTE — Telephone Encounter (Signed)
Letter pended, printed copy to Dr. Quincy Simmonds to review and sign.  ?

## 2021-08-14 NOTE — Telephone Encounter (Signed)
Patient notified letter completed. Patient aware of recommendations per Dr. Quincy Simmonds.  ? ?Patient request letter to be mailed to address on file.  ? ?Letter placed in outgoing mail.  ? ?Encounter closed.  ?

## 2021-08-14 NOTE — Telephone Encounter (Signed)
Please write a letter for patient that she will not be able to serve jury duty for a minimum of 6 weeks following her major surgical procedure, scheduled for 08/20/21.    ?

## 2021-08-15 ENCOUNTER — Ambulatory Visit
Admission: RE | Admit: 2021-08-15 | Discharge: 2021-08-15 | Disposition: A | Discharge status: Home / Self Care | Payer: BC Managed Care – PPO | Source: Ambulatory Visit | Attending: Obstetrics and Gynecology | Admitting: Obstetrics and Gynecology

## 2021-08-15 DIAGNOSIS — Z1231 Encounter for screening mammogram for malignant neoplasm of breast: Secondary | ICD-10-CM

## 2021-08-16 ENCOUNTER — Encounter (HOSPITAL_COMMUNITY)
Admission: RE | Admit: 2021-08-16 | Discharge: 2021-08-16 | Disposition: A | Discharge status: Home / Self Care | Payer: BC Managed Care – PPO | Source: Ambulatory Visit | Attending: Obstetrics and Gynecology | Admitting: Obstetrics and Gynecology

## 2021-08-16 ENCOUNTER — Other Ambulatory Visit: Payer: Self-pay | Admitting: *Deleted

## 2021-08-16 ENCOUNTER — Other Ambulatory Visit: Payer: Self-pay | Admitting: Obstetrics and Gynecology

## 2021-08-16 DIAGNOSIS — Z01812 Encounter for preprocedural laboratory examination: Secondary | ICD-10-CM | POA: Insufficient documentation

## 2021-08-16 DIAGNOSIS — R928 Other abnormal and inconclusive findings on diagnostic imaging of breast: Secondary | ICD-10-CM

## 2021-08-16 DIAGNOSIS — D219 Benign neoplasm of connective and other soft tissue, unspecified: Secondary | ICD-10-CM | POA: Insufficient documentation

## 2021-08-16 LAB — CBC
HCT: 37.8 % (ref 36.0–46.0)
Hemoglobin: 11.4 g/dL — ABNORMAL LOW (ref 12.0–15.0)
MCH: 25.4 pg — ABNORMAL LOW (ref 26.0–34.0)
MCHC: 30.2 g/dL (ref 30.0–36.0)
MCV: 84.4 fL (ref 80.0–100.0)
Platelets: 284 10*3/uL (ref 150–400)
RBC: 4.48 MIL/uL (ref 3.87–5.11)
RDW: 21.7 % — ABNORMAL HIGH (ref 11.5–15.5)
WBC: 5.8 10*3/uL (ref 4.0–10.5)
nRBC: 0 % (ref 0.0–0.2)

## 2021-08-16 LAB — BASIC METABOLIC PANEL
Anion gap: 5 (ref 5–15)
BUN: 7 mg/dL (ref 6–20)
CO2: 25 mmol/L (ref 22–32)
Calcium: 9 mg/dL (ref 8.9–10.3)
Chloride: 109 mmol/L (ref 98–111)
Creatinine, Ser: 0.78 mg/dL (ref 0.44–1.00)
GFR, Estimated: >60 mL/min (ref >60)
Glucose, Bld: 96 mg/dL (ref 70–99)
Potassium: 4.1 mmol/L (ref 3.5–5.1)
Sodium: 139 mmol/L (ref 135–145)

## 2021-08-18 NOTE — H&P (Signed)
Nunzio Cobbs, MD Physician Gynecology Progress Notes    Signed Encounter Date:  08/12/2021   Signed        GYNECOLOGY  VISIT   HPI: 51 y.o.   Single  African American  female   [phone] with Patient's last menstrual period was 07/31/2021.   here for pre-op visit for a TLH w/ salpingectomy, possible oophorectomy, vaginal morcellation of uterus in alexis bag scheduled on 08/20/21.    Patient has fibroids with painful and heavy periods.  Her uterus is 15 - 16 week size with multiple fibroids, and her ovaries are normal.  Her endometrial biopsy showed benign secretory endometrium.    Her last HGB was 9.4 on 06/28/21. Her last iron infusion was 07/25/21.   She has had a tubal and declines future childbearing.    No change in medical status since her last office visit, other than the iron transfusion.    GYNECOLOGIC HISTORY: Patient's last menstrual period was 07/31/2021. Contraception: BTL Menopausal hormone therapy: none Last mammogram: 01/05/2018-neg birads 1 Last pap smear: 07/01/21-WNL, HPV- neg; 2-3 years before WNL per pt @ [facility]         OB History       Gravida  3   Para  2   Term  2   Preterm      AB  1   Living  2        SAB  1   IAB      Ectopic      Multiple      Live Births                        Patient Active Problem List    Diagnosis Date Noted   Iron deficiency anemia due to chronic blood loss - menorrhagia suspected as cause 07/05/2018   Family history of colonic polyps - brother 42's and father 55's 07/05/2018          Past Medical History:  Diagnosis Date   Allergic rhinitis     Anxiety     Chronic epigastric pain      recurrent discrete episodes   Fibroid     Iron deficiency anemia     Iron deficiency anemia due to chronic blood loss 07/05/2018   Panic attack as reaction to stress     Restless leg syndrome             Past Surgical History:  Procedure Laterality Date   CHOLECYSTECTOMY   2012    TUBAL LIGATION       UPPER GASTROINTESTINAL ENDOSCOPY   11/25/2010    NORMAL            Current Outpatient Medications  Medication Sig Dispense Refill   clonazePAM (KLONOPIN) 1 MG tablet Take 1 mg by mouth at bedtime.       ergocalciferol (VITAMIN D2) 1.25 MG (50000 UT) capsule Take by mouth.       ibuprofen (ADVIL) 200 MG tablet Take 200 mg by mouth every 6 (six) hours as needed for fever or mild pain.                 Current Facility-Administered Medications  Medication Dose Route Frequency Provider Last Rate Last Admin   0.9 %  sodium chloride infusion  500 mL Intravenous Once [name], MD          ALLERGIES: Oxycodone        Family  History  Problem Relation Age of Onset   Hypertension Mother     Hypertension Father     Colon polyps Father     Heart attack Brother     Colon polyps Brother          adenomatous polyps   Pancreatic cancer Maternal Aunt     Diabetes Maternal Grandmother     Colon cancer Neg Hx     Esophageal cancer Neg Hx     Stomach cancer Neg Hx     Rectal cancer Neg Hx        Social History         Socioeconomic History   Marital status: Single      Spouse name: Not on file   Number of children: 2   Years of education: Not on file   Highest education level: Not on file  Occupational History   Not on file  Tobacco Use   Smoking status: Never   Smokeless tobacco: Never  Vaping Use   Vaping Use: Never used  Substance and Sexual Activity   Alcohol use: No      Comment: social   Drug use: No   Sexual activity: Yes      Birth control/protection: Surgical      Comment: Tubal/first intercourse 18  Other Topics Concern   Not on file  Social History Narrative    Separated - living with her mom    2 adult kids    Work - self-employed - Nature conservation officer, rare EtOH, no drugs    Social Determinants of Adult nurse Strain: Not on file  Food Insecurity: Not on file  Transportation Needs: Not on file  Physical  Activity: Not on file  Stress: Not on file  Social Connections: Not on file  Intimate Partner Violence: Not on file      Review of Systems  All other systems reviewed and are negative.   PHYSICAL EXAMINATION:     BP 104/72   Pulse 80   Resp (!) 98   LMP 07/31/2021     General appearance: alert, cooperative and appears stated age Head: Normocephalic, without obvious abnormality, atraumatic Neck: no adenopathy, supple, symmetrical, trachea midline and thyroid normal to inspection and palpation Lungs: clear to auscultation bilaterally Heart: regular rate and rhythm Abdomen: soft, non-tender, no masses,  no organomegaly Extremities: extremities normal, atraumatic, no cyanosis or edema No abnormal inguinal nodes palpated Neurologic: Grossly normal   Pelvic:  Deferred.    ASSESSMENT   Status post BTL.  Uterine fibroids.  Anemia.   PLAN   Will proceed with total laparoscopic hysterectomy/bilateral salpingectomy/possible bilateral oophorectomy/vaginal morcellation of uterus in Alexis bag.  Risks, benefits, and alternatives have been reviewed with the patient who wishes to proceed.  I did discuss additional risks surgery to include conversion to laparotomy, vaginal cuff dehiscence, and removal of ovaries if abnormal or bleed heavily.  She wishes to proceed.  Surgical expectations and recovery reviewed.    She will update her mammogram.   CBC today.   An After Visit Summary was printed and given to the patient.              Electronically signed by Nunzio Cobbs, MD at 08/12/2021  8:32 AM

## 2021-08-20 ENCOUNTER — Other Ambulatory Visit: Payer: Self-pay

## 2021-08-20 ENCOUNTER — Encounter (HOSPITAL_BASED_OUTPATIENT_CLINIC_OR_DEPARTMENT_OTHER)
Admission: RE | Disposition: A | Discharge status: Home / Self Care | Payer: Self-pay | Source: Ambulatory Visit | Attending: Obstetrics and Gynecology

## 2021-08-20 ENCOUNTER — Encounter (HOSPITAL_BASED_OUTPATIENT_CLINIC_OR_DEPARTMENT_OTHER): Payer: Self-pay | Admitting: Obstetrics and Gynecology

## 2021-08-20 ENCOUNTER — Observation Stay (HOSPITAL_BASED_OUTPATIENT_CLINIC_OR_DEPARTMENT_OTHER): Payer: BC Managed Care – PPO | Admitting: Anesthesiology

## 2021-08-20 ENCOUNTER — Observation Stay (HOSPITAL_BASED_OUTPATIENT_CLINIC_OR_DEPARTMENT_OTHER)
Admission: RE | Admit: 2021-08-20 | Discharge: 2021-08-20 | Disposition: A | Discharge status: Home / Self Care | Payer: BC Managed Care – PPO | Source: Ambulatory Visit | Attending: Obstetrics and Gynecology | Admitting: Obstetrics and Gynecology

## 2021-08-20 DIAGNOSIS — D219 Benign neoplasm of connective and other soft tissue, unspecified: Secondary | ICD-10-CM | POA: Diagnosis present

## 2021-08-20 DIAGNOSIS — Z01818 Encounter for other preprocedural examination: Secondary | ICD-10-CM

## 2021-08-20 DIAGNOSIS — N8003 Adenomyosis of the uterus: Secondary | ICD-10-CM | POA: Diagnosis not present

## 2021-08-20 DIAGNOSIS — N83292 Other ovarian cyst, left side: Secondary | ICD-10-CM | POA: Insufficient documentation

## 2021-08-20 DIAGNOSIS — D259 Leiomyoma of uterus, unspecified: Principal | ICD-10-CM | POA: Insufficient documentation

## 2021-08-20 DIAGNOSIS — D500 Iron deficiency anemia secondary to blood loss (chronic): Secondary | ICD-10-CM | POA: Diagnosis not present

## 2021-08-20 DIAGNOSIS — D252 Subserosal leiomyoma of uterus: Secondary | ICD-10-CM | POA: Diagnosis not present

## 2021-08-20 DIAGNOSIS — N888 Other specified noninflammatory disorders of cervix uteri: Secondary | ICD-10-CM | POA: Diagnosis not present

## 2021-08-20 DIAGNOSIS — N920 Excessive and frequent menstruation with regular cycle: Secondary | ICD-10-CM | POA: Diagnosis not present

## 2021-08-20 DIAGNOSIS — Z90710 Acquired absence of both cervix and uterus: Secondary | ICD-10-CM | POA: Diagnosis present

## 2021-08-20 HISTORY — PX: CYSTOSCOPY: SHX5120

## 2021-08-20 HISTORY — PX: TOTAL LAPAROSCOPIC HYSTERECTOMY WITH SALPINGECTOMY: SHX6742

## 2021-08-20 HISTORY — DX: Headache, unspecified: R51.9

## 2021-08-20 HISTORY — DX: Presence of spectacles and contact lenses: Z97.3

## 2021-08-20 HISTORY — DX: Excessive and frequent menstruation with regular cycle: N92.0

## 2021-08-20 LAB — TYPE AND SCREEN
ABO/RH(D): A POS
Antibody Screen: NEGATIVE

## 2021-08-20 LAB — ABO/RH: ABO/RH(D): A POS

## 2021-08-20 LAB — POCT PREGNANCY, URINE: Preg Test, Ur: NEGATIVE

## 2021-08-20 SURGERY — HYSTERECTOMY, TOTAL, LAPAROSCOPIC, WITH SALPINGECTOMY
Anesthesia: General | Site: Urethra

## 2021-08-20 MED ORDER — MIDAZOLAM HCL 2 MG/2ML IJ SOLN
INTRAMUSCULAR | Status: AC
  Filled 2021-08-20: qty 2

## 2021-08-20 MED ORDER — ENOXAPARIN SODIUM 40 MG/0.4ML IJ SOSY
PREFILLED_SYRINGE | INTRAMUSCULAR | Status: AC
  Filled 2021-08-20: qty 0.4

## 2021-08-20 MED ORDER — KETOROLAC TROMETHAMINE 30 MG/ML IJ SOLN
INTRAMUSCULAR | Status: AC
  Filled 2021-08-20: qty 1

## 2021-08-20 MED ORDER — FENTANYL CITRATE (PF) 100 MCG/2ML IJ SOLN
INTRAMUSCULAR | Status: DC | PRN
  Administered 2021-08-20: 25 ug via INTRAVENOUS
  Administered 2021-08-20: 100 ug via INTRAVENOUS
  Administered 2021-08-20: 50 ug via INTRAVENOUS

## 2021-08-20 MED ORDER — FENTANYL CITRATE (PF) 250 MCG/5ML IJ SOLN
INTRAMUSCULAR | Status: AC
  Filled 2021-08-20: qty 5

## 2021-08-20 MED ORDER — PHENYLEPHRINE 80 MCG/ML (10ML) SYRINGE FOR IV PUSH (FOR BLOOD PRESSURE SUPPORT)
PREFILLED_SYRINGE | INTRAVENOUS | Status: DC | PRN
  Administered 2021-08-20 (×2): 160 ug via INTRAVENOUS

## 2021-08-20 MED ORDER — HYDROCODONE-ACETAMINOPHEN 5-325 MG PO TABS: 1.0000 | ORAL_TABLET | ORAL | 0 refills | Status: DC | PRN

## 2021-08-20 MED ORDER — ENOXAPARIN SODIUM 40 MG/0.4ML IJ SOSY
40.0000 mg | PREFILLED_SYRINGE | INTRAMUSCULAR | Status: AC
  Administered 2021-08-20: 40 mg via SUBCUTANEOUS

## 2021-08-20 MED ORDER — LIDOCAINE HCL (PF) 2 % IJ SOLN: INTRAMUSCULAR | Status: DC | PRN

## 2021-08-20 MED ORDER — HYDROCODONE-ACETAMINOPHEN 5-325 MG PO TABS
1.0000 | ORAL_TABLET | ORAL | Status: DC | PRN
  Administered 2021-08-20 (×2): 2 via ORAL

## 2021-08-20 MED ORDER — LACTATED RINGERS IV SOLN: INTRAVENOUS | Status: DC

## 2021-08-20 MED ORDER — KETOROLAC TROMETHAMINE 30 MG/ML IJ SOLN
INTRAMUSCULAR | Status: DC | PRN
  Administered 2021-08-20: 30 mg via INTRAVENOUS

## 2021-08-20 MED ORDER — GABAPENTIN 300 MG PO CAPS
300.0000 mg | ORAL_CAPSULE | ORAL | Status: AC
  Administered 2021-08-20: 300 mg via ORAL

## 2021-08-20 MED ORDER — SUGAMMADEX SODIUM 200 MG/2ML IV SOLN
INTRAVENOUS | Status: DC | PRN
  Administered 2021-08-20: 220 mg via INTRAVENOUS
  Administered 2021-08-20: 10 mg via INTRAVENOUS

## 2021-08-20 MED ORDER — LIDOCAINE HCL (PF) 2 % IJ SOLN
INTRAMUSCULAR | Status: AC
  Filled 2021-08-20: qty 10

## 2021-08-20 MED ORDER — GABAPENTIN 300 MG PO CAPS
ORAL_CAPSULE | ORAL | Status: AC
  Filled 2021-08-20: qty 1

## 2021-08-20 MED ORDER — LIDOCAINE HCL (PF) 2 % IJ SOLN
INTRAMUSCULAR | Status: DC | PRN
  Administered 2021-08-20: 7.713 mg/kg/h via INTRADERMAL

## 2021-08-20 MED ORDER — ONDANSETRON HCL 4 MG/2ML IJ SOLN
4.0000 mg | Freq: Four times a day (QID) | INTRAMUSCULAR | Status: DC | PRN
  Administered 2021-08-20: 4 mg via INTRAVENOUS

## 2021-08-20 MED ORDER — ONDANSETRON HCL 4 MG/2ML IJ SOLN
INTRAMUSCULAR | Status: DC | PRN
  Administered 2021-08-20: 4 mg via INTRAVENOUS

## 2021-08-20 MED ORDER — ROCURONIUM BROMIDE 10 MG/ML (PF) SYRINGE
PREFILLED_SYRINGE | INTRAVENOUS | Status: AC
  Filled 2021-08-20: qty 10

## 2021-08-20 MED ORDER — WHITE PETROLATUM EX OINT
TOPICAL_OINTMENT | CUTANEOUS | Status: AC
  Filled 2021-08-20: qty 5

## 2021-08-20 MED ORDER — ROCURONIUM BROMIDE 10 MG/ML (PF) SYRINGE
PREFILLED_SYRINGE | INTRAVENOUS | Status: DC | PRN
  Administered 2021-08-20: 70 mg via INTRAVENOUS

## 2021-08-20 MED ORDER — DEXMEDETOMIDINE (PRECEDEX) IN NS 20 MCG/5ML (4 MCG/ML) IV SYRINGE
PREFILLED_SYRINGE | INTRAVENOUS | Status: DC | PRN
  Administered 2021-08-20: 4 ug via INTRAVENOUS
  Administered 2021-08-20 (×2): 8 ug via INTRAVENOUS

## 2021-08-20 MED ORDER — DEXAMETHASONE SODIUM PHOSPHATE 10 MG/ML IJ SOLN
INTRAMUSCULAR | Status: DC | PRN
  Administered 2021-08-20: 10 mg via INTRAVENOUS

## 2021-08-20 MED ORDER — 0.9 % SODIUM CHLORIDE (POUR BTL) OPTIME
TOPICAL | Status: DC | PRN
  Administered 2021-08-20: 500 mL

## 2021-08-20 MED ORDER — HYDROCODONE-ACETAMINOPHEN 5-325 MG PO TABS
ORAL_TABLET | ORAL | Status: AC
  Filled 2021-08-20: qty 2

## 2021-08-20 MED ORDER — MENTHOL 3 MG MT LOZG
LOZENGE | OROMUCOSAL | Status: AC
  Filled 2021-08-20: qty 9

## 2021-08-20 MED ORDER — MIDAZOLAM HCL 5 MG/5ML IJ SOLN
INTRAMUSCULAR | Status: DC | PRN
  Administered 2021-08-20: 2 mg via INTRAVENOUS

## 2021-08-20 MED ORDER — MENTHOL 3 MG MT LOZG
1.0000 | LOZENGE | OROMUCOSAL | Status: DC | PRN
  Administered 2021-08-20: 3 mg via ORAL

## 2021-08-20 MED ORDER — MORPHINE SULFATE (PF) 4 MG/ML IV SOLN: 1.0000 mg | INTRAVENOUS | Status: DC | PRN

## 2021-08-20 MED ORDER — IBUPROFEN 800 MG PO TABS: 800.0000 mg | ORAL_TABLET | Freq: Three times a day (TID) | ORAL | Status: DC | PRN

## 2021-08-20 MED ORDER — ACETAMINOPHEN 500 MG PO TABS
ORAL_TABLET | ORAL | Status: AC
  Filled 2021-08-20: qty 2

## 2021-08-20 MED ORDER — ACETAMINOPHEN 500 MG PO TABS
1000.0000 mg | ORAL_TABLET | ORAL | Status: AC
  Administered 2021-08-20: 1000 mg via ORAL

## 2021-08-20 MED ORDER — KETOROLAC TROMETHAMINE 30 MG/ML IJ SOLN
INTRAMUSCULAR | Status: AC
Start: 2021-08-20 — End: ?
  Filled 2021-08-20: qty 1

## 2021-08-20 MED ORDER — DEXMEDETOMIDINE (PRECEDEX) IN NS 20 MCG/5ML (4 MCG/ML) IV SYRINGE
PREFILLED_SYRINGE | INTRAVENOUS | Status: AC
  Filled 2021-08-20: qty 5

## 2021-08-20 MED ORDER — BUPIVACAINE HCL (PF) 0.25 % IJ SOLN
INTRAMUSCULAR | Status: DC | PRN
  Administered 2021-08-20: 16 mL

## 2021-08-20 MED ORDER — PROPOFOL 10 MG/ML IV BOLUS
INTRAVENOUS | Status: DC | PRN
  Administered 2021-08-20: 150 mg via INTRAVENOUS

## 2021-08-20 MED ORDER — AMISULPRIDE (ANTIEMETIC) 5 MG/2ML IV SOLN
10.0000 mg | Freq: Once | INTRAVENOUS | Status: AC | PRN
  Administered 2021-08-20: 10 mg via INTRAVENOUS

## 2021-08-20 MED ORDER — ONDANSETRON HCL 4 MG/2ML IJ SOLN
INTRAMUSCULAR | Status: AC
  Filled 2021-08-20: qty 2

## 2021-08-20 MED ORDER — ONDANSETRON HCL 4 MG PO TABS: 4.0000 mg | ORAL_TABLET | Freq: Four times a day (QID) | ORAL | Status: DC | PRN

## 2021-08-20 MED ORDER — HYDROCODONE-ACETAMINOPHEN 7.5-325 MG PO TABS: 1.0000 | ORAL_TABLET | Freq: Once | ORAL | Status: DC | PRN

## 2021-08-20 MED ORDER — POVIDONE-IODINE 10 % EX SWAB: 2.0000 "application " | Freq: Once | CUTANEOUS | Status: DC

## 2021-08-20 MED ORDER — DEXAMETHASONE SODIUM PHOSPHATE 10 MG/ML IJ SOLN
INTRAMUSCULAR | Status: AC
  Filled 2021-08-20: qty 1

## 2021-08-20 MED ORDER — PROPOFOL 10 MG/ML IV BOLUS
INTRAVENOUS | Status: AC
  Filled 2021-08-20: qty 20

## 2021-08-20 MED ORDER — PROPOFOL 10 MG/ML IV BOLUS
INTRAVENOUS | Status: AC
Start: 2021-08-20 — End: ?
  Filled 2021-08-20: qty 20

## 2021-08-20 MED ORDER — LIDOCAINE 2% (20 MG/ML) 5 ML SYRINGE
INTRAMUSCULAR | Status: DC | PRN
Start: 2021-08-20 — End: 2021-08-20
  Administered 2021-08-20: 60 mg via INTRAVENOUS

## 2021-08-20 MED ORDER — SODIUM CHLORIDE 0.9 % IV SOLN
INTRAVENOUS | Status: DC | PRN
  Administered 2021-08-20: 60 mL

## 2021-08-20 MED ORDER — SODIUM CHLORIDE 0.9 % IR SOLN
Status: DC | PRN
  Administered 2021-08-20 (×2): 1000 mL

## 2021-08-20 MED ORDER — SODIUM CHLORIDE 0.9 % IV SOLN
INTRAVENOUS | Status: AC
  Filled 2021-08-20: qty 2

## 2021-08-20 MED ORDER — FENTANYL CITRATE (PF) 100 MCG/2ML IJ SOLN
INTRAMUSCULAR | Status: AC
Start: 2021-08-20 — End: ?
  Filled 2021-08-20: qty 2

## 2021-08-20 MED ORDER — ONDANSETRON HCL 4 MG/2ML IJ SOLN: 4.0000 mg | Freq: Once | INTRAMUSCULAR | Status: DC | PRN

## 2021-08-20 MED ORDER — AMISULPRIDE (ANTIEMETIC) 5 MG/2ML IV SOLN
INTRAVENOUS | Status: AC
  Filled 2021-08-20: qty 4

## 2021-08-20 MED ORDER — KETOROLAC TROMETHAMINE 30 MG/ML IJ SOLN
30.0000 mg | Freq: Four times a day (QID) | INTRAMUSCULAR | Status: DC
  Administered 2021-08-20: 30 mg via INTRAVENOUS

## 2021-08-20 MED ORDER — SODIUM CHLORIDE 0.9 % IV SOLN
2.0000 g | INTRAVENOUS | Status: AC
  Administered 2021-08-20: 2 g via INTRAVENOUS

## 2021-08-20 MED ORDER — IBUPROFEN 800 MG PO TABS: 800.0000 mg | ORAL_TABLET | Freq: Three times a day (TID) | ORAL | 0 refills | Status: DC | PRN

## 2021-08-20 MED ORDER — FENTANYL CITRATE (PF) 100 MCG/2ML IJ SOLN
25.0000 ug | INTRAMUSCULAR | Status: DC | PRN
  Administered 2021-08-20: 25 ug via INTRAVENOUS
  Administered 2021-08-20: 50 ug via INTRAVENOUS

## 2021-08-20 MED ORDER — PHENYLEPHRINE 80 MCG/ML (10ML) SYRINGE FOR IV PUSH (FOR BLOOD PRESSURE SUPPORT)
PREFILLED_SYRINGE | INTRAVENOUS | Status: AC
  Filled 2021-08-20: qty 10

## 2021-08-20 SURGICAL SUPPLY — 64 items
ADH SKN CLS APL DERMABOND .7 (GAUZE/BANDAGES/DRESSINGS) ×4
BAG DECANTER FOR FLEXI CONT (MISCELLANEOUS) ×1 IMPLANT
BLADE SURG 10 STRL SS (BLADE) ×2 IMPLANT
CELL SAVER LIPIGURD (MISCELLANEOUS) ×2 IMPLANT
COVER BACK TABLE 60X90IN (DRAPES) ×1 IMPLANT
COVER MAYO STAND STRL (DRAPES) ×3 IMPLANT
DECANTER SPIKE VIAL GLASS SM (MISCELLANEOUS) ×6 IMPLANT
DERMABOND ADVANCED (GAUZE/BANDAGES/DRESSINGS) ×2
DERMABOND ADVANCED .7 DNX12 (GAUZE/BANDAGES/DRESSINGS) ×2 IMPLANT
DEVICE RETRIEVAL ALEXIS 14 (MISCELLANEOUS) IMPLANT
DRAPE SHEET LG 3/4 BI-LAMINATE (DRAPES) ×1 IMPLANT
DURAPREP 26ML APPLICATOR (WOUND CARE) ×4 IMPLANT
EXTRT SYSTEM ALEXIS 14CM (MISCELLANEOUS) ×3
GAUZE 4X4 16PLY ~~LOC~~+RFID DBL (SPONGE) ×6 IMPLANT
GLOVE BIO SURGEON STRL SZ 6 (GLOVE) ×4 IMPLANT
GLOVE BIO SURGEON STRL SZ 6.5 (GLOVE) ×11 IMPLANT
GLOVE BIOGEL PI IND STRL 6 (GLOVE) IMPLANT
GLOVE BIOGEL PI IND STRL 7.0 (GLOVE) ×2 IMPLANT
GLOVE BIOGEL PI INDICATOR 6 (GLOVE) ×3
GLOVE BIOGEL PI INDICATOR 7.0 (GLOVE) ×8
GLOVE SURG POLYISO LF SZ6.5 (GLOVE) ×3 IMPLANT
GOWN STRL REUS W/ TWL LRG LVL3 (GOWN DISPOSABLE) IMPLANT
GOWN STRL REUS W/TWL LRG LVL3 (GOWN DISPOSABLE) ×17 IMPLANT
HIBICLENS CHG 4% 4OZ (MISCELLANEOUS) ×2 IMPLANT
IV NS 1000ML (IV SOLUTION) ×6
IV NS 1000ML BAXH (IV SOLUTION) IMPLANT
KIT TURNOVER CYSTO (KITS) ×3 IMPLANT
LEGGING LITHOTOMY PAIR STRL (DRAPES) ×1 IMPLANT
LIGASURE VESSEL 5MM BLUNT TIP (ELECTROSURGICAL) ×3 IMPLANT
MANIFOLD NEPTUNE II (INSTRUMENTS) ×1 IMPLANT
NEEDLE INSUFFLATION 120MM (ENDOMECHANICALS) ×3 IMPLANT
NS IRRIG 500ML POUR BTL (IV SOLUTION) ×1 IMPLANT
OCCLUDER COLPOPNEUMO (BALLOONS) ×3 IMPLANT
PACK LAPAROSCOPY BASIN (CUSTOM PROCEDURE TRAY) ×3 IMPLANT
PACK TRENDGUARD 450 HYBRID PRO (MISCELLANEOUS) IMPLANT
POUCH LAPAROSCOPIC INSTRUMENT (MISCELLANEOUS) ×3 IMPLANT
POWDER SURGICEL 3.0 GRAM (HEMOSTASIS) IMPLANT
PROTECTOR NERVE ULNAR (MISCELLANEOUS) ×6 IMPLANT
SET IRRIG Y TYPE TUR BLADDER L (SET/KITS/TRAYS/PACK) ×3 IMPLANT
SET SUCTION IRRIG HYDROSURG (IRRIGATION / IRRIGATOR) ×3 IMPLANT
SET TRI-LUMEN FLTR TB AIRSEAL (TUBING) ×3 IMPLANT
SHEARS 1100 HARMONIC 36 (ELECTROSURGICAL) ×3 IMPLANT
SHEARS HARMONIC ACE PLUS 36CM (ENDOMECHANICALS) ×1 IMPLANT
SHEARS HARMONIC ACE PLUS 45CM (MISCELLANEOUS) IMPLANT
SLEEVE ADV FIXATION 5X100MM (TROCAR) ×3 IMPLANT
SUT DVC VLOC 180 0 12IN GS21 (SUTURE) ×3
SUT VIC AB 0 CT1 27 (SUTURE) ×6
SUT VIC AB 0 CT1 27XBRD ANBCTR (SUTURE) ×4 IMPLANT
SUT VIC AB 4-0 PS2 18 (SUTURE) ×3 IMPLANT
SUT VICRYL 0 UR6 27IN ABS (SUTURE) IMPLANT
SUTURE DVC VLC 180 0 12IN GS21 (SUTURE) IMPLANT
SYR 10ML LL (SYRINGE) ×3 IMPLANT
SYR 50ML LL SCALE MARK (SYRINGE) ×6 IMPLANT
SYR CONTROL 10ML LL (SYRINGE) ×1 IMPLANT
TIP RUMI ORANGE 6.7MMX12CM (TIP) ×1 IMPLANT
TIP UTERINE 6.7X10CM GRN DISP (MISCELLANEOUS) ×1 IMPLANT
TOWEL OR 17X26 10 PK STRL BLUE (TOWEL DISPOSABLE) ×3 IMPLANT
TRAY FOLEY W/BAG SLVR 14FR LF (SET/KITS/TRAYS/PACK) ×3 IMPLANT
TRENDGUARD 450 HYBRID PRO PACK (MISCELLANEOUS) ×3
TROCAR ADV FIXATION 5X100MM (TROCAR) ×3 IMPLANT
TROCAR BLADELESS OPT 5 100 (ENDOMECHANICALS) ×3 IMPLANT
TROCAR BLADELESS OPT 5 150 (ENDOMECHANICALS) ×1 IMPLANT
TROCAR PORT AIRSEAL 5X120 (TROCAR) ×3 IMPLANT
WARMER LAPAROSCOPE (MISCELLANEOUS) ×3 IMPLANT

## 2021-08-20 NOTE — Progress Notes (Signed)
Patient ambulated to the bathroom but was not able to void.

## 2021-08-20 NOTE — Progress Notes (Signed)
Update to History and Physical  Patient has bilateral asymmetries on her mammogram and is scheduled for diagnostic breast imaging in July, which may be moved up if she is able to complete the imaging earlier in her post op recovery.  Patient examined.  Hgb 11.4 on 08/16/21.  OK to proceed with surgery.  Patient and I discussed possible bilateral oophorectomy, but only if abnormal or if has excess bleeding.

## 2021-08-20 NOTE — Progress Notes (Signed)
Day of Surgery Procedure(s) (LRB): TOTAL LAPAROSCOPIC HYSTERECTOMY WITH SALPINGECTOMY, vaginal morcellation of uterus in Alexis bag, LEFT OVARIAN BIOPSY (Bilateral) CYSTOSCOPY (N/A)  Subjective: Patient reports one episode of vomiting.  Able to keep food down.  Voided twice.  Has ambulated.  Taking Vicodin for pain.   Heating pad helps.  Minimal bleeding.  Wants discharge to home.   Objective: I have reviewed patient's vital signs and intake and output. Vitals:   08/20/21 1251 08/20/21 1400  BP: (!) 144/80 131/68  Pulse: 89 89  Resp: 19 15  Temp:  97.9 F (36.6 C)  SpO2: 99% 97%   General:  alert and cooperative.  Lungs:  CTA bilaterally.  Cor:  S1S2 RRR.  Abdomen:  rare bowel sound, soft, nontender.  Incisions clean, dry, and intact.  Vagina:  minimal spotting.  Ext:  PAS and Ted hose one.   Assessment: s/p Procedure(s): TOTAL LAPAROSCOPIC HYSTERECTOMY WITH SALPINGECTOMY, vaginal morcellation of uterus in Alexis bag, LEFT OVARIAN BIOPSY (Bilateral) CYSTOSCOPY (N/A): stable  Plan: [place] for discharge home.  Rx for Vicodin and Motrin.  Post op instructions reviewed with patient in verbal and written form.  Operative findings and procedure reviewed with patient.  FU in the office in one week, sooner if needed.    LOS: 0 days    Victoria Bender 08/20/2021, 5:41 PM

## 2021-08-20 NOTE — Op Note (Signed)
OPERATIVE REPORT   PREOPERATIVE DIAGNOSIS:   Uterine fibroids   POSTOPERATIVE DIAGNOSIS:  Uterine fibroids   PROCEDURES:    Total laparoscopic hysterectomy with bilateral salpingectomy, left ovarian biopsy, vaginal morcellation of uterus in [place] bag, cystoscopy   SURGEON:    Lenard Galloway, M.D.   ASSISTANT:    Dorothy Spark, M.D.   ANESTHESIA:  General endotracheal, intraperitoneal ropivicaine 30 mL diluted in 30 mL of normal saline, local with 0.25% Marcaine.   IVF:   1200 cc LVF   ESTIMATED BLOOD LOSS:   25 cc   URINE OUTPUT:   778 cc   COMPLICATIONS:  None.   INDICATIONS FOR THE PROCEDURE:      The patient is a 51 year old [id number] African American female, status post bilateral tubal ligation, who presents with heavy and painful menses and a know 15 - 16 week size multifibroid uterus. She declines future childbearing and requests hysterectomy.    A plan is made to proceed with a total laparoscopic hysterectomy with bilateral salpingectomy, possible bilateral oophorectomy, and cystoscopy after risks, benefits, and alternatives are reviewed.   FINDINGS:   Exam under anesthesia reveals a 16 week size uterus.  The adnexa were not palpated separately from the uterus.   The uterus was 16 week size with multiple fibroids.  Her fallopian tubes were consistent with prior tubal ligation, and her right ovary contained a corpus luteum cyst.  The left ovary had a 5 mm white exophytic area on the serosa, and this was removed at the time of surgery.   The liver was normal.  There was no evidence of adhesive disease or endometriosis in the abdomen or pelvis.    Cystoscopy at the termination of the procedure showed the bladder to be normal throughout 360 degrees including the bladder dome and trigone. There was no evidence of any foreign body in the bladder or the urethra. There was no evidence of any lesions of the bladder or the urethra.   Both of the ureters were noted to be patent  bilaterally.   SPECIMENS:      The uterus, cervix, and bilateral tubes were sent to pathology together.  The left ovarian biopsy was sent to pathology separately.    The uterine weight was 706 grams.     DESCRIPTION OF PROCEDURE:     The patient was reidentified in the preoperative hold area.   She did receive Cefotetan IV for antibiotic prophylaxis.  She received Lovenox, TED hose, and PAS stockings for DVT prophylaxis.   In the operating room, the patient was placed in the dorsal lithotomy position on the operating room table.   Her legs were placed in the [place] stirrups and her arms were both tucked at her sides. The Trendguard was used to support her shoulders.  The patient received general endotracheal anesthesia. The abdomen and vagina were then sterilely prepped and she was sterilely draped.   A speculum was placed in the vagina and a single-tooth tenaculum was placed on the anterior cervical lip.  A figure-of-eight suture of 0 Vicryl was placed on each the anterior and the posterior cervical lips. The uterus was sounded to 11 cm.  A 10 RUMI tip with a KOH ring was then placed through the cervix and into the uterine cavity.  The remaining vaginal instruments were then removed.  A Foley catheter was placed inside the bladder.   Attention was turned to the abdomen where the umbilical region was injected with 0.25% Marcaine  and a small incision created.  An attempt was made to place a 5 mm regular length trocar and then a long trocar with the Optiview in the umbilicus, but this was not successful due to the thickness of the abdominal wall.     The patient had a left upper quadrant trocar entry next.  The skin was injected with marcaine in the appropriate position along the midclavicular line and two finger breaths below the costal margin.  An incision was created and then a 5 mm trocar successfully intraperitoneally using the Optiview.  An 8 mm incision was placed in the left mid abdomen and a  5 mm incision was placed in the left lower abdomen after the skin was injected locally with 0.25% Marcaine. Respective sized trocars were then placed under visualization of the laparoscope.  A 5 Airseal trocar was placed in the right mid quadrant after injecting with Marcaine and incising with a scalpel.    Ropivicine 30 cc diluted in 30 cc of normal saline was placed inside the peritoneal cavity.    The patient was placed in Trendelenburg position.  An inspection of the abdomen and pelvis was performed. The findings are as noted above.    The bilateral ureters were identified.  The left fallopian tube was grasped and the Ligasure was used to cauterize and cut through the mesosalpinx. The fallopian tube was removed from the peritoneal cavity.  It was then sent to Pathology. The left utero-ovarian ligament was cauterized and cut with the Ligasure.  The left round ligament was then cauterized and divided with same instrument.  Dissection was performed to the anterior and posterior leaves of the broad ligaments using the Harmonic scalpel.     Attention was turned to the patient's right-hand side.  The same procedure that was performed on the left side was repeated on the right side with respect to the isolation, cautery, and transection of the fallopian tube and vessels. The right fallopian tube was removed from the peritoneal cavity and sent to Pathology.    The peritoneum over the lower uterine segment was incised with the Harmonic scalpel and the bladder was dissected away from the cervix using the same instrument.   The right uterine artery was skeletonized with the Harmonic scalpel.  It was then cauterized and bisected with the Ligasure instrument. The same was then performed on the left uterine artery.   The KOH ring was nicely visible.  The colpotomy incision was performed with the Harmonic scalpel in a circumferential fashion. The Rumi device was disconnected from the uterus.  An Alexis bag was  placed through the colpotomy incision and into the peritoneal cavity.  The uterus was place in the bag, which was drawn down into the vagina.  A weighted speculum was placed in the vagina inside the bag. The uterus was morcellated with a scalpel and sent to pathology after it was weighed.   The balloon occluder was placed in the vagina.  The vaginal cuff was sutured using a running suture of 0 V-Loc.  The vagina was closed from the patient's right hand side to the left hand side and then back 2 sutures towards the midline.  This provided good full-thickness closure of the vaginal cuff.  The laparoscopic needle for suturing was removed from the peritoneal cavity.  The lesion of the left ovary was removed using the Harmonic scalpel while holding the lesion with the cobra instrument.  The biopsy was sent to Pathology separately.  Hemostasis was good.  The pelvis was irrigated and suctioned.   The pneumoperitoneal pressure was decreased to 8.  There was good hemostasis of the operative sites and pedicles.     The CO2 pneumoperitoneum was released.  The patient received manual breaths to remove any remaining CO2 gas and all trocars were removed.  All trocar sites, except the umbilicus were closed with subcuticular sutures of 4/0 Vicryl.  Dermabond was placed over all of the incisions.   The vaginal occluder balloon was removed from the vagina.  Speculum exam documented the vaginal cuff to be intact.  The vaginal mucosa appeared normal.  Hemostasis was good.   The patient's Foley catheter was removed and cystoscopy was performed and the findings are as noted above.  The Foley catheter was left out.     This concluded the patient's procedure.  She was extubated and escorted to the recovery room in stable and awake condition.  There were no complications to the procedure.  All needle, instrument, and sponge counts were correct.  An MD assistant was necessary for instrumentation, retraction of tissues,  and positioning of the patient due to the complexity of the case.    Lenard Galloway, M.D.

## 2021-08-20 NOTE — Anesthesia Preprocedure Evaluation (Signed)
Anesthesia Evaluation  Patient identified by MRN, date of birth, ID band Patient awake    Reviewed: Allergy & Precautions, NPO status , Patient's Chart, lab work & pertinent test results  History of Anesthesia Complications Negative for: history of anesthetic complications  Airway Mallampati: III  TM Distance: >3 FB Neck ROM: Full    Dental  (+) Dental Advisory Given, Teeth Intact   Pulmonary neg pulmonary ROS,    Pulmonary exam normal        Cardiovascular negative cardio ROS Normal cardiovascular exam     Neuro/Psych Anxiety negative neurological ROS     GI/Hepatic negative GI ROS, Neg liver ROS,   Endo/Other  Morbid obesity  Renal/GU negative Renal ROS  negative genitourinary   Musculoskeletal negative musculoskeletal ROS (+)   Abdominal   Peds  Hematology  (+) Blood dyscrasia, anemia ,   Anesthesia Other Findings   Reproductive/Obstetrics Menorrhagia, fibroids                             Anesthesia Physical Anesthesia Plan  ASA: 3  Anesthesia Plan: General   Post-op Pain Management: Tylenol PO (pre-op)* and Toradol IV (intra-op)*   Induction: Intravenous  PONV Risk Score and Plan: 3 and Ondansetron, Dexamethasone, Treatment may vary due to age or medical condition and Midazolam  Airway Management Planned: Oral ETT  Additional Equipment: None  Intra-op Plan:   Post-operative Plan: Extubation in OR  Informed Consent: I have reviewed the patients History and Physical, chart, labs and discussed the procedure including the risks, benefits and alternatives for the proposed anesthesia with the patient or authorized representative who has indicated his/her understanding and acceptance.     Dental advisory given  Plan Discussed with:   Anesthesia Plan Comments:         Anesthesia Quick Evaluation

## 2021-08-20 NOTE — Anesthesia Postprocedure Evaluation (Signed)
Anesthesia Post Note  Patient: Victoria Bender  Procedure(s) Performed: TOTAL LAPAROSCOPIC HYSTERECTOMY WITH SALPINGECTOMY, vaginal morcellation of uterus in [place] bag, LEFT OVARIAN BIOPSY (Bilateral: Abdomen) CYSTOSCOPY (Urethra)     Patient location during evaluation: PACU Anesthesia Type: General Level of consciousness: awake and alert Pain management: pain level controlled Vital Signs Assessment: post-procedure vital signs reviewed and stable Respiratory status: spontaneous breathing, nonlabored ventilation and respiratory function stable Cardiovascular status: blood pressure returned to baseline and stable Postop Assessment: no apparent nausea or vomiting Anesthetic complications: no   No notable events documented.  Last Vitals:  Vitals:   08/20/21 1251 08/20/21 1400  BP: (!) 144/80 131/68  Pulse: 89 89  Resp: 19 15  Temp:  36.6 C  SpO2: 99% 97%    Last Pain:  Vitals:   08/20/21 1400  TempSrc:   PainSc: 5                  Lidia Collum

## 2021-08-20 NOTE — Discharge Instructions (Addendum)
Hi Victoria Bender,   Your surgery went well today!  I removed your uterus and cervix and your fallopian tubes.  I did a biopsy of an area on your left ovary that looked like a potential tiny fibroma, a fibroid.  Everything looked normal inside your bladder.   I will see you for your post op visit in one week.   [name], MD  Post Anesthesia Home Care Instructions  Activity: Get plenty of rest for the remainder of the day. A responsible individual must stay with you for 24 hours following the procedure.  For the next 24 hours, DO NOT: -Drive a car -Paediatric nurse -Drink alcoholic beverages -Take any medication unless instructed by your physician -Make any legal decisions or sign important papers.  Meals: Start with liquid foods such as gelatin or soup. Progress to regular foods as tolerated. Avoid greasy, spicy, heavy foods. If nausea and/or vomiting occur, drink only clear liquids until the nausea and/or vomiting subsides. Call your physician if vomiting continues.  Special Instructions/Symptoms: Your throat may feel dry or sore from the anesthesia or the breathing tube placed in your throat during surgery. If this causes discomfort, gargle with warm salt water. The discomfort should disappear within 24 hours.  If you had a scopolamine patch placed behind your ear for the management of post- operative nausea and/or vomiting:  1. The medication in the patch is effective for 72 hours, after which it should be removed.  Wrap patch in a tissue and discard in the trash. Wash hands thoroughly with soap and water. 2. You may remove the patch earlier than 72 hours if you experience unpleasant side effects which may include dry mouth, dizziness or visual disturbances. 3. Avoid touching the patch. Wash your hands with soap and water after contact with the patch.

## 2021-08-20 NOTE — Transfer of Care (Signed)
Immediate Anesthesia Transfer of Care Note  Patient: Victoria Bender  Procedure(s) Performed: TOTAL LAPAROSCOPIC HYSTERECTOMY WITH SALPINGECTOMY, vaginal morcellation of uterus in [place] bag (Bilateral: Abdomen) CYSTOSCOPY (Urethra)  Patient Location: PACU  Anesthesia Type:General  Level of Consciousness: drowsy  Airway & Oxygen Therapy: Patient Spontanous Breathing and Patient connected to nasal cannula oxygen  Post-op Assessment: Report given to RN  Post vital signs: Reviewed and stable  Last Vitals:  Vitals Value Taken Time  BP 132/79 08/20/21 1109  Temp    Pulse 85 08/20/21 1111  Resp 22 08/20/21 1111  SpO2 94 % 08/20/21 1111  Vitals shown include unvalidated device data.  Last Pain:  Vitals:   08/20/21 0600  TempSrc: Oral  PainSc: 0-No pain      Patients Stated Pain Goal: 8 (91/91/66 0600)  Complications: No notable events documented.

## 2021-08-20 NOTE — Anesthesia Procedure Notes (Signed)
Procedure Name: Intubation Date/Time: 08/20/2021 7:34 AM Performed by: Bonney Aid, CRNA Pre-anesthesia Checklist: Patient identified, Emergency Drugs available, Suction available and Patient being monitored Patient Re-evaluated:Patient Re-evaluated prior to induction Oxygen Delivery Method: Circle system utilized Preoxygenation: Pre-oxygenation with 100% oxygen Induction Type: IV induction Ventilation: Mask ventilation without difficulty Laryngoscope Size: Mac and 3 Grade View: Grade I Tube type: Oral Tube size: 7.0 mm Number of attempts: 1 Airway Equipment and Method: Stylet Placement Confirmation: ETT inserted through vocal cords under direct vision, positive ETCO2 and breath sounds checked- equal and bilateral Secured at: 20 cm Tube secured with: Tape Dental Injury: Teeth and Oropharynx as per pre-operative assessment

## 2021-08-21 ENCOUNTER — Encounter (HOSPITAL_BASED_OUTPATIENT_CLINIC_OR_DEPARTMENT_OTHER): Payer: Self-pay | Admitting: Obstetrics and Gynecology

## 2021-08-21 LAB — SURGICAL PATHOLOGY

## 2021-08-22 ENCOUNTER — Telehealth: Payer: Self-pay

## 2021-08-22 MED ORDER — TRAMADOL HCL 50 MG PO TABS: 50.0000 mg | ORAL_TABLET | Freq: Four times a day (QID) | ORAL | 0 refills | Status: DC | PRN

## 2021-08-22 NOTE — Telephone Encounter (Signed)
She can try Tramadol 50 mg, take one tablet by mouth every 6 hours as needed for moderate pain.  I will send a prescription to her pharmacy.

## 2021-08-22 NOTE — Telephone Encounter (Signed)
[name] 08/20/21. Pt calling tor report that the hydrocodone that was Rx'd for pain is giving her extreme headaches. Asking is there are any alternatives. Please advise.

## 2021-08-23 ENCOUNTER — Telehealth: Payer: Self-pay | Admitting: *Deleted

## 2021-08-23 NOTE — Telephone Encounter (Signed)
PA done via cover my meds for generic ultram 50 mg tablet. With ICD-10 code G89.18.  Medication was denied by [facility] it did not meet require of the plan. Medication covered when you have any of the these conditions:  Pain due to cancer, sickle cell disease, or a terminal condition. Pain being managed through hospice or palliative care. Chronic pain that has lasted greater than 3 months.  I called patient to relay to her and explain she can use GoodRx coupon, I sent text to her phone with coupon code per patient request.

## 2021-08-23 NOTE — Telephone Encounter (Signed)
Pt notified and voiced understanding 

## 2021-08-25 NOTE — Discharge Summary (Signed)
Physician Discharge Summary  Patient ID: Victoria Bender MRN: [id number] DOB/AGE: Feb 23, 1971 51 y.o.  Admit date: 08/20/2021 Discharge date: 08/20/21  Admission Diagnoses: Uterine fibroids  Discharge Diagnoses:   Uterine fibroids  Left ovarian lesion  Status post total laparoscopic hysterectomy with bilateral salpingectomy, left ovarian biopsy, vaginal morcellation of uterus in [place] bag, cystoscopy Principal Problem:   Fibroids Active Problems:   Status post laparoscopic hysterectomy   Discharged Condition: good  Hospital Course: The patient was admitted on  08/20/21 for a total laparoscopic hysterectomy with bilateral salpingectomy, left ovarian biopsy, vaginal morcellation of uterus in [place] bag, cystoscopy which were performed without complication while under general anesthesia.  The patient's post op course was fairly uneventful.  She received dilaudid IV and Toradol IV for pain control initially, and she developed vomiting. She was treated with Zofran.  Her pain medication was converted over to Vicodin and Motrin on post op day zero.  She was able to keep down food and liquids prior to discharge home.  She ambulated and wore PAS and Ted hose for DVT prophylaxis while in bed.  She was able to void well following her surgery.  The patient's vital signs remained stable and she demonstrated no signs of infection during her hospitalization.  She had very minimal vaginal bleeding, and her incision(s) demonstrated no signs of erythema or significant drainage.  She was found to be in good condition and ready for discharge on post op day zero.  Consults: None  Significant Diagnostic Studies:  Surgical pathology was pending at the time of her discharge.  Treatments: surgery:  Total laparoscopic hysterectomy with bilateral salpingectomy, left ovarian biopsy, vaginal morcellation of uterus in [place] bag, cystoscopy  Discharge Exam: Blood pressure (!) 142/83, pulse 85, temperature 98.6 F  (37 C), resp. rate 15, height '5\' 5"'$  (1.651 m), weight 108.9 kg, last menstrual period 07/31/2021, SpO2 100 %. General:  alert and cooperative.  Lungs:  CTA bilaterally.  Cor:  S1S2 RRR.  Abdomen:  rare bowel sound, soft, nontender.  Incisions clean, dry, and intact.  Vagina:  minimal spotting.  Ext:  PAS and Ted hose on.   Disposition:   Discharge instructions were reviewed and verbal and written form.    Allergies as of 08/20/2021       Reactions   Oxycodone    Has strong headache.        Medication List     STOP taking these medications    clonazePAM 1 MG tablet Commonly known as: KLONOPIN       TAKE these medications    ergocalciferol 1.25 MG (50000 UT) capsule Commonly known as: VITAMIN D2 Take by mouth.   ibuprofen 800 MG tablet Commonly known as: ADVIL Take 1 tablet (800 mg total) by mouth every 8 (eight) hours as needed for mild pain or moderate pain. What changed:  medication strength how much to take when to take this reasons to take this      Vicodin 5 mg/325 mg, take one by mouth every 4 hours as needed for moderate pain.   Follow-up Information     Nunzio Cobbs, MD Follow up in 1 week(s).   Specialty: Obstetrics and Gynecology Contact information: [address] [place] [address] [phone]                 Signed: [name] 08/25/2021, 2:50 PM

## 2021-08-27 NOTE — Progress Notes (Unsigned)
GYNECOLOGY  VISIT   HPI: 51 y.o.   Single  African American  female   [phone] with Patient's last menstrual period was 07/31/2021.   here for 1 week status post TOTAL LAPAROSCOPIC HYSTERECTOMY WITH SALPINGECTOMY, vaginal morcellation of uterus in Alexis bag, LEFT OVARIAN BIOPSY (Bilateral: Abdomen)  CYSTOSCOPY (Urethra).   Final pathology showed fibroids and adenomyosis.  Cervix benign.   Left ovarian biopsy corpora albicantia.   Feeling good.  Some cramping.  Bled the first day only.   Hydrocodone caused headache.  Received an Rx for Tramadol, and she did not pick up the Rx.   Having regular BMs.  Tolerating food well.  Voiding ok.   Has mammogram scheduled for July 10th.  GYNECOLOGIC HISTORY: Patient's last menstrual period was 07/31/2021. Contraception:  Tubal/Hyst Menopausal hormone therapy:  none Last mammogram:  01/05/2018-neg birads 1 Last pap smear:  07-01-21 Neg:Neg HR HPV, ~2020 normal with Wend.OB/BYN        OB History     Gravida  3   Para  2   Term  2   Preterm      AB  1   Living  2      SAB  1   IAB      Ectopic      Multiple      Live Births                 Patient Active Problem List   Diagnosis Date Noted   Fibroids 08/20/2021   Status post laparoscopic hysterectomy 08/20/2021   Iron deficiency anemia due to chronic blood loss - menorrhagia suspected as cause 07/05/2018   Family history of colonic polyps - brother 53's and father 53's 07/05/2018    Past Medical History:  Diagnosis Date   Allergic rhinitis    Anxiety    Chest pain 06/10/2021   See ED visit note in Epic from 06/10/21. ACS ruled out. Patient has had intermittent episodes of CP & palpitations over the years. Last Cardiology OV 06/12/21 with [facility] in [place] [employer].   Chronic epigastric pain    recurrent discrete episodes   COVID-19 10/01/2020   HA, loss of taste, fever   Fibroid    Headache    occasional migraines   Iron deficiency anemia  due to chronic blood loss 07/05/2018   Patient has received multiple iron transfusions. As of 08/13/21, her last iron infusion was 07/25/21.   Menorrhagia    Panic attack as reaction to stress    Restless leg syndrome    Wears contact lenses    Wears glasses     Past Surgical History:  Procedure Laterality Date   CHOLECYSTECTOMY  2012   CYSTOSCOPY N/A 08/20/2021   Procedure: CYSTOSCOPY;  Surgeon: Nunzio Cobbs, MD;  Location: [facility];  Service: Gynecology;  Laterality: N/A;   ENDOMETRIAL BIOPSY  07/01/2021   benign secretory endometrium   TOTAL LAPAROSCOPIC HYSTERECTOMY WITH SALPINGECTOMY Bilateral 08/20/2021   Procedure: TOTAL LAPAROSCOPIC HYSTERECTOMY WITH SALPINGECTOMY, vaginal morcellation of uterus in Alexis bag, LEFT OVARIAN BIOPSY;  Surgeon: Nunzio Cobbs, MD;  Location: [facility];  Service: Gynecology;  Laterality: Bilateral;   TUBAL LIGATION  1998   UPPER GASTROINTESTINAL ENDOSCOPY  11/25/2010   NORMAL    Current Outpatient Medications  Medication Sig Dispense Refill   ergocalciferol (VITAMIN D2) 1.25 MG (50000 UT) capsule Take by mouth.     ibuprofen (ADVIL) 800 MG tablet  Take 1 tablet (800 mg total) by mouth every 8 (eight) hours as needed for mild pain or moderate pain. (Patient not taking: Reported on 08/28/2021) 30 tablet 0   traMADol (ULTRAM) 50 MG tablet Take 1 tablet (50 mg total) by mouth every 6 (six) hours as needed. (Patient not taking: Reported on 08/28/2021) 20 tablet 0   No current facility-administered medications for this visit.     ALLERGIES: Oxycodone  Family History  Problem Relation Age of Onset   Hypertension Mother    Hypertension Father    Colon polyps Father    Heart attack Brother    Colon polyps Brother        adenomatous polyps   Pancreatic cancer Maternal Aunt    Diabetes Maternal Grandmother    Colon cancer Neg Hx    Esophageal cancer Neg Hx    Stomach cancer Neg Hx    Rectal  cancer Neg Hx     Social History   Socioeconomic History   Marital status: Single    Spouse name: Not on file   Number of children: 2   Years of education: Not on file   Highest education level: Not on file  Occupational History   Not on file  Tobacco Use   Smoking status: Never   Smokeless tobacco: Never  Vaping Use   Vaping Use: Never used  Substance and Sexual Activity   Alcohol use: No    Comment: only a few times per year   Drug use: No   Sexual activity: Yes    Birth control/protection: Surgical    Comment: Tubal/first intercourse 18  Other Topics Concern   Not on file  Social History Narrative   Separated - living with her mom   2 adult kids   Work - self-employed - Public relations account executive, rare EtOH, no drugs   Social Determinants of Radio broadcast assistant Strain: Not on file  Food Insecurity: Not on file  Transportation Needs: Not on file  Physical Activity: Not on file  Stress: Not on file  Social Connections: Not on file  Intimate Partner Violence: Not on file    Review of Systems  All other systems reviewed and are negative.  PHYSICAL EXAMINATION:    BP 110/68   Pulse 72   Ht 5' 4.5" (1.638 m)   Wt 239 lb (108.4 kg)   LMP 07/31/2021   SpO2 98%   BMI 40.39 kg/m     General appearance: alert, cooperative and appears stated age   Abdomen: umbilical incision with Dermabond absent.  Skin edges with slight separation, cleansed with H2)2 and sterile Q-tips.  Other incisions intact with Dermabond absent.  Abdomen is soft, non-tender, no masses,  no organomegaly   ASSESSMENT  Status post laparoscopic hysterectomy.  Doing well post op.  Slight separation on umbilical incision.   PLAN  Surgical findings and pathology report reviewed.  Ok to clean incisions with soap and water.  Continue decreased activity.  Check CBC today. Fu for 6 week post op visit and prn.   An After Visit Summary was printed and given to the patient.

## 2021-08-28 ENCOUNTER — Encounter: Payer: Self-pay | Admitting: Obstetrics and Gynecology

## 2021-08-28 ENCOUNTER — Ambulatory Visit (INDEPENDENT_AMBULATORY_CARE_PROVIDER_SITE_OTHER): Payer: BC Managed Care – PPO | Admitting: Obstetrics and Gynecology

## 2021-08-28 VITALS — BP 110/68 | HR 72 | Ht 64.5 in | Wt 239.0 lb

## 2021-08-28 DIAGNOSIS — Z90710 Acquired absence of both cervix and uterus: Secondary | ICD-10-CM

## 2021-08-29 LAB — CBC
HCT: 40.6 % (ref 35.0–45.0)
Hemoglobin: 12.5 g/dL (ref 11.7–15.5)
MCH: 25.9 pg — ABNORMAL LOW (ref 27.0–33.0)
MCHC: 30.8 g/dL — ABNORMAL LOW (ref 32.0–36.0)
MCV: 84.2 fL (ref 80.0–100.0)
MPV: 13 fL — ABNORMAL HIGH (ref 7.5–12.5)
Platelets: 277 10*3/uL (ref 140–400)
RBC: 4.82 10*6/uL (ref 3.80–5.10)
RDW: 19.8 % — ABNORMAL HIGH (ref 11.0–15.0)
WBC: 5.9 10*3/uL (ref 3.8–10.8)

## 2021-09-19 ENCOUNTER — Ambulatory Visit: Payer: BC Managed Care – PPO

## 2021-09-19 ENCOUNTER — Ambulatory Visit
Admission: RE | Admit: 2021-09-19 | Discharge: 2021-09-19 | Disposition: A | Discharge status: Home / Self Care | Payer: BC Managed Care – PPO | Source: Ambulatory Visit | Attending: Obstetrics and Gynecology | Admitting: Obstetrics and Gynecology

## 2021-09-19 DIAGNOSIS — R928 Other abnormal and inconclusive findings on diagnostic imaging of breast: Secondary | ICD-10-CM

## 2021-09-25 ENCOUNTER — Encounter: Payer: Self-pay | Admitting: *Deleted

## 2021-09-30 ENCOUNTER — Encounter: Payer: BC Managed Care – PPO | Admitting: Obstetrics and Gynecology

## 2021-10-07 ENCOUNTER — Other Ambulatory Visit: Payer: BC Managed Care – PPO

## 2021-10-07 ENCOUNTER — Ambulatory Visit (INDEPENDENT_AMBULATORY_CARE_PROVIDER_SITE_OTHER): Payer: BC Managed Care – PPO | Admitting: Obstetrics and Gynecology

## 2021-10-07 ENCOUNTER — Encounter: Payer: Self-pay | Admitting: Obstetrics and Gynecology

## 2021-10-07 VITALS — BP 122/76 | Ht 64.5 in | Wt 239.0 lb

## 2021-10-07 DIAGNOSIS — Z0289 Encounter for other administrative examinations: Secondary | ICD-10-CM

## 2021-10-07 DIAGNOSIS — Z90710 Acquired absence of both cervix and uterus: Secondary | ICD-10-CM

## 2021-10-07 NOTE — Progress Notes (Signed)
GYNECOLOGY  VISIT   HPI: 51 y.o.   Single  African American  female   [phone] with Patient's last menstrual period was 07/31/2021 (exact date).   here for 7 weeks status post TOTAL LAPAROSCOPIC HYSTERECTOMY WITH SALPINGECTOMY, vaginal morcellation of uterus in Alexis bag, LEFT OVARIAN BIOPSY (Bilateral: Abdomen)  CYSTOSCOPY (Urethra).  Feels really good following surgery.  No discharge.  Good bladder and bowel function.   New grand daughter born one month ago, Counsellor.  GYNECOLOGIC HISTORY: Patient's last menstrual period was 07/31/2021 (exact date). Contraception: Tubal/Hyst Menopausal hormone therapy:  none Last mammogram:   09-19-21 Rt.Br.focal assymetry;Lt.Br.focal asymmetry/Bil.diagnostic/resolution of questoned bil.asymmetries/Neg/BiRads1/screening 28yr Last pap smear:   07-01-21 Neg:Neg HR HPV, ~2020 normal with Wend.OB/BYN        OB History     Gravida  3   Para  2   Term  2   Preterm      AB  1   Living  2      SAB  1   IAB      Ectopic      Multiple      Live Births                 Patient Active Problem List   Diagnosis Date Noted   Fibroids 08/20/2021   Status post laparoscopic hysterectomy 08/20/2021   Iron deficiency anemia due to chronic blood loss - menorrhagia suspected as cause 07/05/2018   Family history of colonic polyps - brother 459'sand father 690's04/08/2018    Past Medical History:  Diagnosis Date   Allergic rhinitis    Anxiety    Chest pain 06/10/2021   See ED visit note in Epic from 06/10/21. ACS ruled out. Patient has had intermittent episodes of CP & palpitations over the years. [facility] [place]   Chronic epigastric pain    recurrent discrete episodes   COVID-19 10/01/2020   HA, loss of taste, fever   Fibroid    Headache    occasional migraines   Iron deficiency anemia due to chronic blood loss 07/05/2018   Patient has received multiple iron transfusions. As  of 08/13/21, her last iron infusion was 07/25/21.   Menorrhagia    Panic attack as reaction to stress    Restless leg syndrome    Wears contact lenses    Wears glasses     Past Surgical History:  Procedure Laterality Date   CHOLECYSTECTOMY  2012   CYSTOSCOPY N/A 08/20/2021   Procedure: CYSTOSCOPY;  Surgeon: ANunzio Cobbs MD;  [facility]  Service: Gynecology;  Laterality: N/A;   ENDOMETRIAL BIOPSY  07/01/2021   benign secretory endometrium   TOTAL LAPAROSCOPIC HYSTERECTOMY WITH SALPINGECTOMY Bilateral 08/20/2021   Procedure: TOTAL LAPAROSCOPIC HYSTERECTOMY WITH SALPINGECTOMY, vaginal morcellation of uterus in Alexis bag, LEFT OVARIAN BIOPSY;  Surgeon: ANunzio Cobbs MD;  [facility]  Service: Gynecology;  Laterality: Bilateral;   TUBAL LIGATION  1998   UPPER GASTROINTESTINAL ENDOSCOPY  11/25/2010   NORMAL    Current Outpatient Medications  Medication Sig Dispense Refill   ergocalciferol (VITAMIN D2) 1.25 MG (50000 UT) capsule Take by mouth.     No current facility-administered medications for this visit.     ALLERGIES: Oxycodone  Family History  Problem Relation Age of Onset   Hypertension Mother    Hypertension Father    Colon polyps Father  Heart attack Brother    Colon polyps Brother        adenomatous polyps   Pancreatic cancer Maternal Aunt    Diabetes Maternal Grandmother    Colon cancer Neg Hx    Esophageal cancer Neg Hx    Stomach cancer Neg Hx    Rectal cancer Neg Hx     Social History   Socioeconomic History   Marital status: Single    Spouse name: Not on file   Number of children: 2   Years of education: Not on file   Highest education level: Not on file  Occupational History   Not on file  Tobacco Use   Smoking status: Never   Smokeless tobacco: Never  Vaping Use   Vaping Use: Never used  Substance and Sexual Activity   Alcohol use: No    Comment: only a few times per  year   Drug use: No   Sexual activity: Yes    Birth control/protection: Surgical    Comment: Tubal/first intercourse 18  Other Topics Concern   Not on file  Social History Narrative   Separated - living with her mom   2 adult kids   Work - self-employed - Public relations account executive, rare EtOH, no drugs   Social Determinants of Radio broadcast assistant Strain: Not on file  Food Insecurity: Not on file  Transportation Needs: Not on file  Physical Activity: Not on file  Stress: Not on file  Social Connections: Not on file  Intimate Partner Violence: Not on file    Review of Systems  All other systems reviewed and are negative.   PHYSICAL EXAMINATION:    BP 122/76   Ht 5' 4.5" (1.638 m)   Wt 239 lb (108.4 kg)   LMP 07/31/2021 (Exact Date)   BMI 40.39 kg/m     General appearance: alert, cooperative and appears stated age   Abdomen: soft, non-tender, no masses,  no organomegaly                   Small piece of suture present in right sided incision, removed by me.   Pelvic: External genitalia:  no lesions              Urethra:  normal appearing urethra with no masses, tenderness or lesions              Bartholins and Skenes: normal                 Vagina: normal appearing vagina with normal color and discharge, no lesions              Cervix: absent.  Cuff intact.                 Bimanual Exam:  Uterus:  absent              Adnexa: no mass, fullness, tenderness             Chaperone was present for exam:  Kimalexis.   ASSESSMENT  Status post laparoscopic hysterectomy.    PLAN  Continue pelvic rest.  Return for final post op visit in 6 weeks.     An After Visit Summary was printed and given to the patient.

## 2021-10-28 ENCOUNTER — Inpatient Hospital Stay: Payer: BC Managed Care – PPO

## 2021-10-28 ENCOUNTER — Inpatient Hospital Stay: Payer: BC Managed Care – PPO | Attending: Hematology | Admitting: Hematology

## 2021-11-19 NOTE — Progress Notes (Signed)
GYNECOLOGY  VISIT   HPI: 51 y.o.   Single  African American  female   [phone] with Patient's last menstrual period was 07/31/2021 (exact date).   here for 3 month follow up status post TOTAL LAPAROSCOPIC HYSTERECTOMY WITH SALPINGECTOMY, vaginal morcellation of uterus in Alexis bag, LEFT OVARIAN BIOPSY (Bilateral: Abdomen)  CYSTOSCOPY (Urethra).  Had intercourse a couple of weeks ago.  No pain or bleeding.   Has more energy after hysterectomy.   GYNECOLOGIC HISTORY: Patient's last menstrual period was 07/31/2021 (exact date). Contraception: Tubal/Hyst Menopausal hormone therapy:  none Last mammogram:  09-19-21 Rt.Br.focal assymetry;Lt.Br.focal asymmetry/Bil.diagnostic/resolution of questoned bil.asymmetries/Neg/BiRads1/screening 32yr Last pap smear:   07-01-21 Neg:Neg HR HPV, ~2020 normal with Wend.OB/BYN        OB History     Gravida  3   Para  2   Term  2   Preterm      AB  1   Living  2      SAB  1   IAB      Ectopic      Multiple      Live Births                 Patient Active Problem List   Diagnosis Date Noted   Fibroids 08/20/2021   Status post laparoscopic hysterectomy 08/20/2021   Iron deficiency anemia due to chronic blood loss - menorrhagia suspected as cause 07/05/2018   Family history of colonic polyps - brother 495'sand father 625's04/08/2018    Past Medical History:  Diagnosis Date   Allergic rhinitis    Anxiety    Chest pain 06/10/2021   See ED visit note in Epic from 06/10/21. ACS ruled out. Patient has had intermittent episodes of CP & palpitations over the years. [facility] [place]   Chronic epigastric pain    recurrent discrete episodes   COVID-19 10/01/2020   HA, loss of taste, fever   Fibroid    Headache    occasional migraines   Iron deficiency anemia due to chronic blood loss 07/05/2018   Patient has received multiple iron transfusions. As of 08/13/21, her last iron  infusion was 07/25/21.   Menorrhagia    Panic attack as reaction to stress    Restless leg syndrome    Wears contact lenses    Wears glasses     Past Surgical History:  Procedure Laterality Date   CHOLECYSTECTOMY  2012   CYSTOSCOPY N/A 08/20/2021   Procedure: CYSTOSCOPY;  Surgeon: ANunzio Cobbs MD;  [facility]  Service: Gynecology;  Laterality: N/A;   ENDOMETRIAL BIOPSY  07/01/2021   benign secretory endometrium   TOTAL LAPAROSCOPIC HYSTERECTOMY WITH SALPINGECTOMY Bilateral 08/20/2021   Procedure: TOTAL LAPAROSCOPIC HYSTERECTOMY WITH SALPINGECTOMY, vaginal morcellation of uterus in Alexis bag, LEFT OVARIAN BIOPSY;  Surgeon: ANunzio Cobbs MD;  [facility]  Service: Gynecology;  Laterality: Bilateral;   TUBAL LIGATION  1998   UPPER GASTROINTESTINAL ENDOSCOPY  11/25/2010   NORMAL    No current outpatient medications on file.   No current facility-administered medications for this visit.     ALLERGIES: Oxycodone  Family History  Problem Relation Age of Onset   Hypertension Mother    Hypertension Father    Colon polyps Father    Heart attack Brother    Colon polyps Brother        adenomatous polyps  Pancreatic cancer Maternal Aunt    Diabetes Maternal Grandmother    Colon cancer Neg Hx    Esophageal cancer Neg Hx    Stomach cancer Neg Hx    Rectal cancer Neg Hx     Social History   Socioeconomic History   Marital status: Single    Spouse name: Not on file   Number of children: 2   Years of education: Not on file   Highest education level: Not on file  Occupational History   Not on file  Tobacco Use   Smoking status: Never   Smokeless tobacco: Never  Vaping Use   Vaping Use: Never used  Substance and Sexual Activity   Alcohol use: No    Comment: only a few times per year   Drug use: No   Sexual activity: Yes    Birth control/protection: Surgical    Comment: Tubal/first intercourse  18  Other Topics Concern   Not on file  Social History Narrative   Separated - living with her mom   2 adult kids   Work - self-employed - Public relations account executive, rare EtOH, no drugs   Social Determinants of Radio broadcast assistant Strain: Not on file  Food Insecurity: Not on file  Transportation Needs: Not on file  Physical Activity: Not on file  Stress: Not on file  Social Connections: Not on file  Intimate Partner Violence: Not on file    Review of Systems  All other systems reviewed and are negative.   PHYSICAL EXAMINATION:    BP 110/72   Ht 5' 4.5" (1.638 m)   Wt 239 lb (108.4 kg)   LMP 07/31/2021 (Exact Date)   BMI 40.39 kg/m     General appearance: alert, cooperative and appears stated age   Abdomen: incisions intact.  Abdomen is soft, non-tender, no masses,  no organomegaly   Pelvic: External genitalia:  no lesions              Urethra:  normal appearing urethra with no masses, tenderness or lesions              Bartholins and Skenes: normal                 Vagina: normal appearing vagina with normal color and discharge, no lesions              Cervix:  absent.  Cuff intact.  No suture present.                Bimanual Exam:  Uterus: absent              Adnexa: no mass, fullness, tenderness            Chaperone was present for exam:  yes.  ASSESSMENT  Status post laparoscopic hysterectomy with bilateral salpingectomy, cystoscopy.  Doing well post op.  PLAN  Ok to return to all normal physical activity.  Follow up for annual exam in April and follow up prn.  An After Visit Summary was printed and given to the patient.

## 2021-11-20 ENCOUNTER — Encounter: Payer: Self-pay | Admitting: Obstetrics and Gynecology

## 2021-11-20 ENCOUNTER — Ambulatory Visit (INDEPENDENT_AMBULATORY_CARE_PROVIDER_SITE_OTHER): Payer: BC Managed Care – PPO | Admitting: Obstetrics and Gynecology

## 2021-11-20 VITALS — BP 110/72 | Ht 64.5 in | Wt 239.0 lb

## 2021-11-20 DIAGNOSIS — Z90710 Acquired absence of both cervix and uterus: Secondary | ICD-10-CM

## 2021-12-17 ENCOUNTER — Inpatient Hospital Stay (HOSPITAL_BASED_OUTPATIENT_CLINIC_OR_DEPARTMENT_OTHER): Payer: BC Managed Care – PPO | Admitting: Hematology

## 2021-12-17 ENCOUNTER — Inpatient Hospital Stay: Payer: BC Managed Care – PPO | Attending: Hematology

## 2021-12-17 ENCOUNTER — Other Ambulatory Visit: Payer: Self-pay

## 2021-12-17 VITALS — BP 114/83 | HR 78 | Temp 97.9°F | Resp 14 | Ht 64.5 in | Wt 242.2 lb

## 2021-12-17 DIAGNOSIS — Z79899 Other long term (current) drug therapy: Secondary | ICD-10-CM | POA: Diagnosis not present

## 2021-12-17 DIAGNOSIS — N920 Excessive and frequent menstruation with regular cycle: Secondary | ICD-10-CM | POA: Diagnosis present

## 2021-12-17 DIAGNOSIS — F5089 Other specified eating disorder: Secondary | ICD-10-CM | POA: Diagnosis not present

## 2021-12-17 DIAGNOSIS — G2581 Restless legs syndrome: Secondary | ICD-10-CM | POA: Insufficient documentation

## 2021-12-17 DIAGNOSIS — D500 Iron deficiency anemia secondary to blood loss (chronic): Secondary | ICD-10-CM | POA: Insufficient documentation

## 2021-12-17 LAB — CMP (CANCER CENTER ONLY)
ALT: 9 U/L (ref 0–44)
AST: 11 U/L — ABNORMAL LOW (ref 15–41)
Albumin: 4.1 g/dL (ref 3.5–5.0)
Alkaline Phosphatase: 45 U/L (ref 38–126)
Anion gap: 3 — ABNORMAL LOW (ref 5–15)
BUN: 7 mg/dL (ref 6–20)
CO2: 27 mmol/L (ref 22–32)
Calcium: 9.1 mg/dL (ref 8.9–10.3)
Chloride: 108 mmol/L (ref 98–111)
Creatinine: 0.82 mg/dL (ref 0.44–1.00)
GFR, Estimated: >60 mL/min (ref >60)
Glucose, Bld: 100 mg/dL — ABNORMAL HIGH (ref 70–99)
Potassium: 4.1 mmol/L (ref 3.5–5.1)
Sodium: 138 mmol/L (ref 135–145)
Total Bilirubin: 0.6 mg/dL (ref 0.3–1.2)
Total Protein: 7.2 g/dL (ref 6.5–8.1)

## 2021-12-17 LAB — CBC WITH DIFFERENTIAL (CANCER CENTER ONLY)
Abs Immature Granulocytes: 0.01 10*3/uL (ref 0.00–0.07)
Basophils Absolute: 0 10*3/uL (ref 0.0–0.1)
Basophils Relative: 0 %
Eosinophils Absolute: 0.1 10*3/uL (ref 0.0–0.5)
Eosinophils Relative: 1 %
HCT: 39.8 % (ref 36.0–46.0)
Hemoglobin: 13.1 g/dL (ref 12.0–15.0)
Immature Granulocytes: 0 %
Lymphocytes Relative: 30 %
Lymphs Abs: 1.7 10*3/uL (ref 0.7–4.0)
MCH: 28.5 pg (ref 26.0–34.0)
MCHC: 32.9 g/dL (ref 30.0–36.0)
MCV: 86.7 fL (ref 80.0–100.0)
Monocytes Absolute: 0.3 10*3/uL (ref 0.1–1.0)
Monocytes Relative: 6 %
Neutro Abs: 3.7 10*3/uL (ref 1.7–7.7)
Neutrophils Relative %: 63 %
Platelet Count: 242 10*3/uL (ref 150–400)
RBC: 4.59 MIL/uL (ref 3.87–5.11)
RDW: 14.7 % (ref 11.5–15.5)
WBC Count: 5.8 10*3/uL (ref 4.0–10.5)
nRBC: 0 % (ref 0.0–0.2)

## 2021-12-17 LAB — FERRITIN: Ferritin: 19 ng/mL (ref 11–307)

## 2021-12-17 LAB — IRON AND IRON BINDING CAPACITY (CC-WL,HP ONLY)
Iron: 42 ug/dL (ref 28–170)
Saturation Ratios: 10 % — ABNORMAL LOW (ref 10.4–31.8)
TIBC: 426 ug/dL (ref 250–450)
UIBC: 384 ug/dL (ref 148–442)

## 2021-12-17 NOTE — Progress Notes (Signed)
Marland Kitchen   HEMATOLOGY/ONCOLOGY CLINIC NOTE  Date of Service: 12/17/2021  Patient Care Team: Hayden Rasmussen, MD as PCP - General (Family Medicine) Obgyn, Erling Conte  CHIEF COMPLAINTS/PURPOSE OF CONSULTATION:  Follow-up for continued management of iron deficiency anemia  HISTORY OF PRESENTING ILLNESS:  Victoria Bender is a wonderful 51 y.o. female who has been referred to [place] by Dr .Darron Doom, Maebelle Munroe, MD  for evaluation and management of Iron deficiency anemia.  Patient has history of allergic rhinitis, persistent asthma, deficiency anemia due to history of ulcers restless leg syndrome was previously seen by [place] this 20 2019 for iron deficiency anemia related to chronic blood loss from menorrhagia.  She received IV Injectafer x2 doses and was able to follow-up with [place] did not return to care.  She notes she has had some insurance issues that precluded appropriate medical follow-up.  Patient notes she continues to have heavy periods.  Recent labs with her primary care physician on 02/01/2021 showed hemoglobin of 8.1 with an MCV of 70.9 with WBC count of 7.1k and platelets of 428k Ferritin 2.3  Patient notes no overt GI bleeding.  No hematochezia or melena.  No hematuria.  No gum bleeding.  No recent surgeries causing blood loss. Patient notes no issues with tolerance of previous IV iron infusion. Patient notes significant fatigue, recurrence of severe pica symptoms and craving for chewing ice, worsening of her restless leg syndrome. She is scheduled to receive IV iron provided consent to set this up.  She is aware about the pros and cons of IV iron infusion and would like to proceed.  INTERVAL HISTORY:  Victoria Bender is a 51 y.o. female here for continued evaluation and management of iron deficiency anemia. She reports She is doing well with no new symptoms or concerns.  She notes she feels really cold all over. We discussed that her counts are not indicative of significant anemia at this  time and is likely something else.   No symptoms of PICA. No other new or acute focal symptoms.  Labs done today were reviewed in detail.  MEDICAL HISTORY:  Past Medical History:  Diagnosis Date   Allergic rhinitis    Anxiety    Chest pain 06/10/2021   See ED visit note in Epic from 06/10/21. ACS ruled out. Patient has had intermittent episodes of CP & palpitations over the years. Last Cardiology OV 06/12/21 with [facility] in [place].   Chronic epigastric pain    recurrent discrete episodes   COVID-19 10/01/2020   HA, loss of taste, fever   Fibroid    Headache    occasional migraines   Iron deficiency anemia due to chronic blood loss 07/05/2018   Patient has received multiple iron transfusions. As of 08/13/21, her last iron infusion was 07/25/21.   Menorrhagia    Panic attack as reaction to stress    Restless leg syndrome    Wears contact lenses    Wears glasses     SURGICAL HISTORY: Past Surgical History:  Procedure Laterality Date   CHOLECYSTECTOMY  2012   CYSTOSCOPY N/A 08/20/2021   Procedure: CYSTOSCOPY;  Surgeon: Nunzio Cobbs, MD;  Location: [facility];  Service: Gynecology;  Laterality: N/A;   ENDOMETRIAL BIOPSY  07/01/2021   benign secretory endometrium   TOTAL LAPAROSCOPIC HYSTERECTOMY WITH SALPINGECTOMY Bilateral 08/20/2021   Procedure: TOTAL LAPAROSCOPIC HYSTERECTOMY WITH SALPINGECTOMY, vaginal morcellation of uterus in Alexis bag, LEFT OVARIAN BIOPSY;  Surgeon: Yisroel Ramming,  Everardo All, MD;  Location: [facility];  Service: Gynecology;  Laterality: Bilateral;   TUBAL LIGATION  1998   UPPER GASTROINTESTINAL ENDOSCOPY  11/25/2010   NORMAL    SOCIAL HISTORY: Social History   Socioeconomic History   Marital status: Single    Spouse name: Not on file   Number of children: 2   Years of education: Not on file   Highest education level: Not on file  Occupational History   Not on file  Tobacco Use    Smoking status: Never   Smokeless tobacco: Never  Vaping Use   Vaping Use: Never used  Substance and Sexual Activity   Alcohol use: No    Comment: only a few times per year   Drug use: No   Sexual activity: Yes    Birth control/protection: Surgical    Comment: Tubal/first intercourse 18  Other Topics Concern   Not on file  Social History Narrative   Separated - living with her mom   2 adult kids   Work - self-employed - Public relations account executive, rare EtOH, no drugs   Social Determinants of Radio broadcast assistant Strain: Not on Art therapist Insecurity: Not on file  Transportation Needs: Not on file  Physical Activity: Not on file  Stress: Not on file  Social Connections: Not on file  Intimate Partner Violence: Not on file    FAMILY HISTORY: Family History  Problem Relation Age of Onset   Hypertension Mother    Hypertension Father    Colon polyps Father    Heart attack Brother    Colon polyps Brother        adenomatous polyps   Pancreatic cancer Maternal Aunt    Diabetes Maternal Grandmother    Colon cancer Neg Hx    Esophageal cancer Neg Hx    Stomach cancer Neg Hx    Rectal cancer Neg Hx     ALLERGIES:  is allergic to oxycodone.  MEDICATIONS:  No current outpatient medications on file.   No current facility-administered medications for this visit.    REVIEW OF SYSTEMS:    .10 Point review of Systems was done is negative except as noted above.  PHYSICAL EXAMINATION: ECOG PERFORMANCE STATUS: 1  . Vitals:   12/17/21 1146  BP: 114/83  Pulse: 78  Resp: 14  Temp: 97.9 F (36.6 C)  SpO2: 99%   Filed Weights   12/17/21 1146  Weight: 242 lb 3.2 oz (109.9 kg)   .Body mass index is 40.93 kg/m. NAD GENERAL:alert, in no acute distress and comfortable SKIN: no acute rashes, no significant lesions EYES: conjunctiva are pink and non-injected, sclera anicteric NECK: supple, no JVD LYMPH:  no palpable lymphadenopathy in the cervical, axillary or  inguinal regions LUNGS: clear to auscultation b/l with normal respiratory effort HEART: regular rate & rhythm ABDOMEN:  normoactive bowel sounds , non tender, not distended. Extremity: no pedal edema PSYCH: alert & oriented x 3 with fluent speech NEURO: no focal motor/sensory deficits  LABORATORY DATA:  I have reviewed the data as listed  .    Latest Ref Rng & Units 12/17/2021   11:28 AM 08/28/2021   11:36 AM 08/16/2021    1:23 PM  CBC  WBC 4.0 - 10.5 K/uL 5.8  5.9  5.8   Hemoglobin 12.0 - 15.0 g/dL 13.1  12.5  11.4   Hematocrit 36.0 - 46.0 % 39.8  40.6  37.8   Platelets 150 - 400 K/uL 242  277  284     .    Latest Ref Rng & Units 12/17/2021   11:28 AM 08/16/2021    1:23 PM 06/28/2021   12:25 PM  CMP  Glucose 70 - 99 mg/dL 100  96  114   BUN 6 - 20 mg/dL '7  7  5   '$ Creatinine 0.44 - 1.00 mg/dL 0.82  0.78  0.79   Sodium 135 - 145 mmol/L 138  139  138   Potassium 3.5 - 5.1 mmol/L 4.1  4.1  3.8   Chloride 98 - 111 mmol/L 108  109  108   CO2 22 - 32 mmol/L '27  25  24   '$ Calcium 8.9 - 10.3 mg/dL 9.1  9.0  9.0   Total Protein 6.5 - 8.1 g/dL 7.2   7.4   Total Bilirubin 0.3 - 1.2 mg/dL 0.6   0.4   Alkaline Phos 38 - 126 U/L 45   41   AST 15 - 41 U/L 11   8   ALT 0 - 44 U/L 9   6    . Lab Results  Component Value Date   IRON 42 12/17/2021   TIBC 426 12/17/2021   IRONPCTSAT 10 (L) 12/17/2021   (Iron and TIBC)  Lab Results  Component Value Date   FERRITIN 19 12/17/2021     RADIOGRAPHIC STUDIES: I have personally reviewed the radiological images as listed and agreed with the findings in the report. No results found.  ASSESSMENT & PLAN:   51 year old female with history of recurrent iron deficiency anemia from menorrhagia with  1) Symptomatic severe iron deficiency Anemia due to menorrhagia. 2) menorrhagia -unknown etiology will need OB/GYN follow-up. 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 PLAN -Discussed available labs in detail with the patient. -Labs remain stable. -Iron labs show iron sat ratio of 10% and Ferritin is 19. Given ongoing menorrhagia will give additional IV Iron to target ferritin>50 and iron sat>20% -CBC with on anemia  Follow-up IV venofer '300mg'$  weekly x 3 doses RTC with Dr Irene Limbo in 6 months with labs  .The total time spent in the appointment was 21 minutes* .  All of the patient's questions were answered with apparent satisfaction. The patient knows to call the clinic with any problems, questions or concerns.   Sullivan Lone MD MS AAHIVMS [facility] Hematology/Oncology Physician [facility]  .*Total Encounter Time as defined by the Centers for Medicare and Medicaid Services includes, in addition to the face-to-face time of a patient visit (documented in the note above) non-face-to-face time: obtaining and reviewing outside history, ordering and reviewing medications, tests or procedures, care coordination (communications with other health care professionals or caregivers) and documentation in the medical record.  I, Melene Muller, am acting as scribe for Dr. Sullivan Lone, MD.

## 2021-12-24 ENCOUNTER — Encounter: Payer: Self-pay | Admitting: Hematology

## 2021-12-27 ENCOUNTER — Inpatient Hospital Stay: Payer: BC Managed Care – PPO

## 2021-12-27 VITALS — BP 110/76 | HR 80 | Temp 98.3°F | Resp 18

## 2021-12-27 DIAGNOSIS — D500 Iron deficiency anemia secondary to blood loss (chronic): Secondary | ICD-10-CM | POA: Diagnosis not present

## 2021-12-27 MED ORDER — ACETAMINOPHEN 325 MG PO TABS
650.0000 mg | ORAL_TABLET | Freq: Once | ORAL | Status: AC
  Administered 2021-12-27: 650 mg via ORAL
  Filled 2021-12-27: qty 2

## 2021-12-27 MED ORDER — SODIUM CHLORIDE 0.9 % IV SOLN
300.0000 mg | Freq: Once | INTRAVENOUS | Status: AC
  Administered 2021-12-27: 300 mg via INTRAVENOUS
  Filled 2021-12-27: qty 200

## 2021-12-27 MED ORDER — SODIUM CHLORIDE 0.9 % IV SOLN: Freq: Once | INTRAVENOUS | Status: AC

## 2021-12-27 MED ORDER — LORATADINE 10 MG PO TABS
10.0000 mg | ORAL_TABLET | Freq: Once | ORAL | Status: AC
  Administered 2021-12-27: 10 mg via ORAL
  Filled 2021-12-27: qty 1

## 2021-12-27 NOTE — Patient Instructions (Signed)

## 2021-12-27 NOTE — Progress Notes (Signed)
Pt declined to stay for 30 minute observation period post Venofer infusion. VSS. No complaints at time of discharge.  

## 2022-01-03 ENCOUNTER — Inpatient Hospital Stay: Payer: BC Managed Care – PPO | Attending: Hematology

## 2022-01-03 VITALS — BP 114/79 | HR 82 | Temp 97.8°F | Resp 18

## 2022-01-03 DIAGNOSIS — D500 Iron deficiency anemia secondary to blood loss (chronic): Secondary | ICD-10-CM | POA: Diagnosis present

## 2022-01-03 DIAGNOSIS — N920 Excessive and frequent menstruation with regular cycle: Secondary | ICD-10-CM | POA: Insufficient documentation

## 2022-01-03 MED ORDER — SODIUM CHLORIDE 0.9 % IV SOLN
300.0000 mg | Freq: Once | INTRAVENOUS | Status: AC
  Administered 2022-01-03: 300 mg via INTRAVENOUS
  Filled 2022-01-03: qty 300

## 2022-01-03 MED ORDER — ACETAMINOPHEN 325 MG PO TABS
650.0000 mg | ORAL_TABLET | Freq: Once | ORAL | Status: AC
  Administered 2022-01-03: 650 mg via ORAL
  Filled 2022-01-03: qty 2

## 2022-01-03 MED ORDER — LORATADINE 10 MG PO TABS
10.0000 mg | ORAL_TABLET | Freq: Once | ORAL | Status: AC
  Administered 2022-01-03: 10 mg via ORAL
  Filled 2022-01-03: qty 1

## 2022-01-03 MED ORDER — SODIUM CHLORIDE 0.9 % IV SOLN: Freq: Once | INTRAVENOUS | Status: AC

## 2022-01-03 NOTE — Patient Instructions (Signed)

## 2022-01-03 NOTE — Progress Notes (Signed)
Patient declined to stay 30 minute post infusion observation period. VSS, no signs of distress noted at discharge. 

## 2022-01-09 ENCOUNTER — Inpatient Hospital Stay: Payer: BC Managed Care – PPO

## 2022-01-09 VITALS — BP 105/70 | HR 80 | Temp 98.4°F | Resp 16

## 2022-01-09 DIAGNOSIS — D500 Iron deficiency anemia secondary to blood loss (chronic): Secondary | ICD-10-CM | POA: Diagnosis not present

## 2022-01-09 MED ORDER — SODIUM CHLORIDE 0.9 % IV SOLN
300.0000 mg | Freq: Once | INTRAVENOUS | Status: AC
  Administered 2022-01-09: 300 mg via INTRAVENOUS
  Filled 2022-01-09: qty 300

## 2022-01-09 MED ORDER — SODIUM CHLORIDE 0.9 % IV SOLN: Freq: Once | INTRAVENOUS | Status: AC

## 2022-01-09 MED ORDER — ACETAMINOPHEN 325 MG PO TABS
650.0000 mg | ORAL_TABLET | Freq: Once | ORAL | Status: AC
  Administered 2022-01-09: 650 mg via ORAL
  Filled 2022-01-09: qty 2

## 2022-01-09 MED ORDER — LORATADINE 10 MG PO TABS
10.0000 mg | ORAL_TABLET | Freq: Once | ORAL | Status: AC
  Administered 2022-01-09: 10 mg via ORAL
  Filled 2022-01-09: qty 1

## 2022-01-09 NOTE — Progress Notes (Signed)
Patient declined to stay for post venofer monitoring. Patient ambulatory and VSS at discharge.  

## 2022-01-09 NOTE — Patient Instructions (Signed)

## 2022-01-10 ENCOUNTER — Ambulatory Visit: Payer: BC Managed Care – PPO

## 2022-03-22 NOTE — Telephone Encounter (Cosign Needed)
See notes

## 2022-04-29 ENCOUNTER — Telehealth: Payer: Self-pay | Admitting: Hematology

## 2022-04-29 NOTE — Telephone Encounter (Signed)
Called patient per provider PAL. Patient r/s and notified.

## 2022-06-17 ENCOUNTER — Inpatient Hospital Stay: Payer: BC Managed Care – PPO | Attending: Nurse Practitioner

## 2022-06-17 ENCOUNTER — Inpatient Hospital Stay (HOSPITAL_BASED_OUTPATIENT_CLINIC_OR_DEPARTMENT_OTHER): Payer: BC Managed Care – PPO | Admitting: Hematology

## 2022-06-17 VITALS — BP 138/89 | HR 72 | Temp 97.7°F | Resp 19 | Ht 64.5 in | Wt 246.4 lb

## 2022-06-17 DIAGNOSIS — D500 Iron deficiency anemia secondary to blood loss (chronic): Secondary | ICD-10-CM

## 2022-06-17 DIAGNOSIS — D509 Iron deficiency anemia, unspecified: Secondary | ICD-10-CM | POA: Insufficient documentation

## 2022-06-17 DIAGNOSIS — G2581 Restless legs syndrome: Secondary | ICD-10-CM | POA: Insufficient documentation

## 2022-06-17 DIAGNOSIS — Z9049 Acquired absence of other specified parts of digestive tract: Secondary | ICD-10-CM | POA: Diagnosis not present

## 2022-06-17 LAB — CMP (CANCER CENTER ONLY)
ALT: 11 U/L (ref 0–44)
AST: 13 U/L — ABNORMAL LOW (ref 15–41)
Albumin: 3.9 g/dL (ref 3.5–5.0)
Alkaline Phosphatase: 45 U/L (ref 38–126)
Anion gap: 3 — ABNORMAL LOW (ref 5–15)
BUN: 9 mg/dL (ref 6–20)
CO2: 28 mmol/L (ref 22–32)
Calcium: 9.3 mg/dL (ref 8.9–10.3)
Chloride: 107 mmol/L (ref 98–111)
Creatinine: 0.84 mg/dL (ref 0.44–1.00)
GFR, Estimated: >60 mL/min (ref >60)
Glucose, Bld: 96 mg/dL (ref 70–99)
Potassium: 4.1 mmol/L (ref 3.5–5.1)
Sodium: 138 mmol/L (ref 135–145)
Total Bilirubin: 0.4 mg/dL (ref 0.3–1.2)
Total Protein: 7.1 g/dL (ref 6.5–8.1)

## 2022-06-17 LAB — CBC WITH DIFFERENTIAL (CANCER CENTER ONLY)
Abs Immature Granulocytes: 0.01 10*3/uL (ref 0.00–0.07)
Basophils Absolute: 0 10*3/uL (ref 0.0–0.1)
Basophils Relative: 1 %
Eosinophils Absolute: 0.2 10*3/uL (ref 0.0–0.5)
Eosinophils Relative: 4 %
HCT: 42.2 % (ref 36.0–46.0)
Hemoglobin: 14.3 g/dL (ref 12.0–15.0)
Immature Granulocytes: 0 %
Lymphocytes Relative: 26 %
Lymphs Abs: 1.3 10*3/uL (ref 0.7–4.0)
MCH: 30.3 pg (ref 26.0–34.0)
MCHC: 33.9 g/dL (ref 30.0–36.0)
MCV: 89.4 fL (ref 80.0–100.0)
Monocytes Absolute: 0.5 10*3/uL (ref 0.1–1.0)
Monocytes Relative: 9 %
Neutro Abs: 3 10*3/uL (ref 1.7–7.7)
Neutrophils Relative %: 60 %
Platelet Count: 202 10*3/uL (ref 150–400)
RBC: 4.72 MIL/uL (ref 3.87–5.11)
RDW: 12.8 % (ref 11.5–15.5)
WBC Count: 5.1 10*3/uL (ref 4.0–10.5)
nRBC: 0 % (ref 0.0–0.2)

## 2022-06-17 LAB — IRON AND IRON BINDING CAPACITY (CC-WL,HP ONLY)
Iron: 58 ug/dL (ref 28–170)
Saturation Ratios: 16 % (ref 10.4–31.8)
TIBC: 356 ug/dL (ref 250–450)
UIBC: 298 ug/dL (ref 148–442)

## 2022-06-17 LAB — FERRITIN: Ferritin: 121 ng/mL (ref 11–307)

## 2022-06-17 NOTE — Progress Notes (Signed)
Marland Kitchen   HEMATOLOGY/ONCOLOGY CLINIC NOTE  Date of Service: 06/17/2022  Patient Care Team: Hayden Rasmussen, MD as PCP - General (Family Medicine) Obgyn, Erling Conte  CHIEF COMPLAINTS/PURPOSE OF CONSULTATION:  Follow-up for continued management of iron deficiency anemia  HISTORY OF PRESENTING ILLNESS:  Victoria Bender is a wonderful 52 y.o. female who has been referred to [place] by Dr .Darron Doom, Maebelle Munroe, MD  for evaluation and management of Iron deficiency anemia.  Patient has history of allergic rhinitis, persistent asthma, deficiency anemia due to history of ulcers restless leg syndrome was previously seen by [place] this 20 2019 for iron deficiency anemia related to chronic blood loss from menorrhagia.  She received IV Injectafer x2 doses and was able to follow-up with [place] did not return to care.  She notes she has had some insurance issues that precluded appropriate medical follow-up.  Patient notes she continues to have heavy periods.  Recent labs with her primary care physician on 02/01/2021 showed hemoglobin of 8.1 with an MCV of 70.9 with WBC count of 7.1k and platelets of 428k Ferritin 2.3  Patient notes no overt GI bleeding.  No hematochezia or melena.  No hematuria.  No gum bleeding.  No recent surgeries causing blood loss. Patient notes no issues with tolerance of previous IV iron infusion. Patient notes significant fatigue, recurrence of severe pica symptoms and craving for chewing ice, worsening of her restless leg syndrome. She is scheduled to receive IV iron provided consent to set this up.  She is aware about the pros and cons of IV iron infusion and would like to proceed.  INTERVAL HISTORY:  Victoria Bender is a 52 y.o. female here for continued evaluation and management of iron deficiency anemia.  Patient was last seen by me on 12/17/2021 and she was doing well overall.  Patient is accompanied by her family member during this visit. She reports she has been doing well overall  without any new medical concerns since our last visit.   Patient had hysterectomy since our last visit. She did not have any complications with hysterectomy.   She denies fever, chills, night sweats, infection issues, abnormal blood loss, abdominal pain, back pain, chest pain, or leg swelling. However, she is struggling with cold during this visit.   MEDICAL HISTORY:  Past Medical History:  Diagnosis Date   Allergic rhinitis    Anxiety    Chest pain 06/10/2021   See ED visit note in Epic from 06/10/21. ACS ruled out. Patient has had intermittent episodes of CP & palpitations over the years. Last Cardiology OV 06/12/21 with [facility] in [place].   Chronic epigastric pain    recurrent discrete episodes   COVID-19 10/01/2020   HA, loss of taste, fever   Fibroid    Headache    occasional migraines   Iron deficiency anemia due to chronic blood loss 07/05/2018   Patient has received multiple iron transfusions. As of 08/13/21, her last iron infusion was 07/25/21.   Menorrhagia    Panic attack as reaction to stress    Restless leg syndrome    Wears contact lenses    Wears glasses     SURGICAL HISTORY: Past Surgical History:  Procedure Laterality Date   CHOLECYSTECTOMY  2012   CYSTOSCOPY N/A 08/20/2021   Procedure: CYSTOSCOPY;  Surgeon: Nunzio Cobbs, MD;  Location: [facility];  Service: Gynecology;  Laterality: N/A;   ENDOMETRIAL BIOPSY  07/01/2021   benign secretory endometrium  TOTAL LAPAROSCOPIC HYSTERECTOMY WITH SALPINGECTOMY Bilateral 08/20/2021   Procedure: TOTAL LAPAROSCOPIC HYSTERECTOMY WITH SALPINGECTOMY, vaginal morcellation of uterus in Alexis bag, LEFT OVARIAN BIOPSY;  Surgeon: Nunzio Cobbs, MD;  Location: [facility];  Service: Gynecology;  Laterality: Bilateral;   TUBAL LIGATION  1998   UPPER GASTROINTESTINAL ENDOSCOPY  11/25/2010   NORMAL    SOCIAL HISTORY: Social History   Socioeconomic  History   Marital status: Single    Spouse name: Not on file   Number of children: 2   Years of education: Not on file   Highest education level: Not on file  Occupational History   Not on file  Tobacco Use   Smoking status: Never   Smokeless tobacco: Never  Vaping Use   Vaping Use: Never used  Substance and Sexual Activity   Alcohol use: No    Comment: only a few times per year   Drug use: No   Sexual activity: Yes    Birth control/protection: Surgical    Comment: Tubal/first intercourse 18  Other Topics Concern   Not on file  Social History Narrative   Separated - living with her mom   2 adult kids   Work - self-employed - Public relations account executive, rare EtOH, no drugs   Social Determinants of Radio broadcast assistant Strain: Not on Art therapist Insecurity: Not on file  Transportation Needs: Not on file  Physical Activity: Not on file  Stress: Not on file  Social Connections: Not on file  Intimate Partner Violence: Not on file    FAMILY HISTORY: Family History  Problem Relation Age of Onset   Hypertension Mother    Hypertension Father    Colon polyps Father    Heart attack Brother    Colon polyps Brother        adenomatous polyps   Pancreatic cancer Maternal Aunt    Diabetes Maternal Grandmother    Colon cancer Neg Hx    Esophageal cancer Neg Hx    Stomach cancer Neg Hx    Rectal cancer Neg Hx     ALLERGIES:  is allergic to oxycodone.  MEDICATIONS:  No current outpatient medications on file.   No current facility-administered medications for this visit.    REVIEW OF SYSTEMS:    .10 Point review of Systems was done is negative except as noted above.  PHYSICAL EXAMINATION: ECOG PERFORMANCE STATUS: 1  . Vitals:   06/17/22 1259  BP: 138/89  Pulse: 72  Resp: 19  Temp: 97.7 F (36.5 C)  SpO2: 99%    Filed Weights   06/17/22 1259  Weight: 246 lb 6.4 oz (111.8 kg)    .Body mass index is 41.64 kg/m. NAD GENERAL:alert, in no acute distress  and comfortable SKIN: no acute rashes, no significant lesions EYES: conjunctiva are pink and non-injected, sclera anicteric NECK: supple, no JVD LYMPH:  no palpable lymphadenopathy in the cervical, axillary or inguinal regions LUNGS: clear to auscultation b/l with normal respiratory effort HEART: regular rate & rhythm ABDOMEN:  normoactive bowel sounds , non tender, not distended. Extremity: no pedal edema PSYCH: alert & oriented x 3 with fluent speech NEURO: no focal motor/sensory deficits  LABORATORY DATA:  I have reviewed the data as listed  .    Latest Ref Rng & Units 12/17/2021   11:28 AM 08/28/2021   11:36 AM 08/16/2021    1:23 PM  CBC  WBC 4.0 - 10.5 K/uL 5.8  5.9  5.8  Hemoglobin 12.0 - 15.0 g/dL 13.1  12.5  11.4   Hematocrit 36.0 - 46.0 % 39.8  40.6  37.8   Platelets 150 - 400 K/uL 242  277  284     .    Latest Ref Rng & Units 12/17/2021   11:28 AM 08/16/2021    1:23 PM 06/28/2021   12:25 PM  CMP  Glucose 70 - 99 mg/dL 100  96  114   BUN 6 - 20 mg/dL 7  7  5    Creatinine 0.44 - 1.00 mg/dL 0.82  0.78  0.79   Sodium 135 - 145 mmol/L 138  139  138   Potassium 3.5 - 5.1 mmol/L 4.1  4.1  3.8   Chloride 98 - 111 mmol/L 108  109  108   CO2 22 - 32 mmol/L 27  25  24    Calcium 8.9 - 10.3 mg/dL 9.1  9.0  9.0   Total Protein 6.5 - 8.1 g/dL 7.2   7.4   Total Bilirubin 0.3 - 1.2 mg/dL 0.6   0.4   Alkaline Phos 38 - 126 U/L 45   41   AST 15 - 41 U/L 11   8   ALT 0 - 44 U/L 9   6    . Lab Results  Component Value Date   IRON 42 12/17/2021   TIBC 426 12/17/2021   IRONPCTSAT 10 (L) 12/17/2021   (Iron and TIBC)  Lab Results  Component Value Date   FERRITIN 19 12/17/2021     RADIOGRAPHIC STUDIES: I have personally reviewed the radiological images as listed and agreed with the findings in the report. No results found.  ASSESSMENT & PLAN:   53 year old female with history of recurrent iron deficiency anemia from menorrhagia with  1) Symptomatic severe iron  deficiency Anemia due to menorrhagia. 2) menorrhagia -unknown etiology will need OB/GYN follow-up. 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  PLAN: -Discussed lab results from today, 06/17/2022, with the patient. CBC an CMP are stable.   -ferritin well replaced at 12 with iron saturation of 16% -patient has had her hysterectomy and the reason for her IDA has been addressed and iron labs are stable. -no indication for additional IV iron at this time.2 -Continue to follow-up with PCP.  RTC with Dr Irene Limbo as needed   FOLLOW-UP: RTC with Dr Irene Limbo as needed  The total time spent in the appointment was 15 minutes* .  All of the patient's questions were answered with apparent satisfaction. The patient knows to call the clinic with any problems, questions or concerns.   Sullivan Lone MD MS AAHIVMS [facility] [place] Hematology/Oncology Physician [facility]  .*Total Encounter Time as defined by the Centers for Medicare and Medicaid Services includes, in addition to the face-to-face time of a patient visit (documented in the note above) non-face-to-face time: obtaining and reviewing outside history, ordering and reviewing medications, tests or procedures, care coordination (communications with other health care professionals or caregivers) and documentation in the medical record.   I, Cleda Mccreedy, am acting as a Education administrator for Sullivan Lone, MD. .I have reviewed the above documentation for accuracy and completeness, and I agree with the above. Brunetta Genera MD

## 2022-06-23 ENCOUNTER — Encounter: Payer: Self-pay | Admitting: Hematology

## 2022-06-25 ENCOUNTER — Ambulatory Visit: Payer: BC Managed Care – PPO | Admitting: Hematology

## 2022-06-25 ENCOUNTER — Other Ambulatory Visit: Payer: BC Managed Care – PPO

## 2022-07-18 ENCOUNTER — Other Ambulatory Visit: Payer: Self-pay

## 2022-07-18 ENCOUNTER — Emergency Department: Payer: BC Managed Care – PPO

## 2022-07-18 ENCOUNTER — Emergency Department
Admission: EM | Admit: 2022-07-18 | Discharge: 2022-07-18 | Disposition: A | Discharge status: Home / Self Care | Payer: BC Managed Care – PPO | Attending: Emergency Medicine | Admitting: Emergency Medicine

## 2022-07-18 DIAGNOSIS — R0789 Other chest pain: Secondary | ICD-10-CM

## 2022-07-18 DIAGNOSIS — J40 Bronchitis, not specified as acute or chronic: Secondary | ICD-10-CM | POA: Insufficient documentation

## 2022-07-18 DIAGNOSIS — R0602 Shortness of breath: Secondary | ICD-10-CM | POA: Diagnosis present

## 2022-07-18 LAB — CBC WITH DIFFERENTIAL/PLATELET
Abs Immature Granulocytes: 0.03 10*3/uL (ref 0.00–0.07)
Basophils Absolute: 0 10*3/uL (ref 0.0–0.1)
Basophils Relative: 0 %
Eosinophils Absolute: 0.1 10*3/uL (ref 0.0–0.5)
Eosinophils Relative: 1 %
HCT: 42.6 % (ref 36.0–46.0)
Hemoglobin: 13.8 g/dL (ref 12.0–15.0)
Immature Granulocytes: 0 %
Lymphocytes Relative: 26 %
Lymphs Abs: 2.5 10*3/uL (ref 0.7–4.0)
MCH: 29.5 pg (ref 26.0–34.0)
MCHC: 32.4 g/dL (ref 30.0–36.0)
MCV: 91 fL (ref 80.0–100.0)
Monocytes Absolute: 0.7 10*3/uL (ref 0.1–1.0)
Monocytes Relative: 7 %
Neutro Abs: 6.4 10*3/uL (ref 1.7–7.7)
Neutrophils Relative %: 66 %
Platelets: 176 10*3/uL (ref 150–400)
RBC: 4.68 MIL/uL (ref 3.87–5.11)
RDW: 13 % (ref 11.5–15.5)
WBC: 9.6 10*3/uL (ref 4.0–10.5)
nRBC: 0 % (ref 0.0–0.2)

## 2022-07-18 LAB — COMPREHENSIVE METABOLIC PANEL
ALT: 15 U/L (ref 0–44)
AST: 20 U/L (ref 15–41)
Albumin: 3.5 g/dL (ref 3.5–5.0)
Alkaline Phosphatase: 47 U/L (ref 38–126)
Anion gap: 7 (ref 5–15)
BUN: 11 mg/dL (ref 6–20)
CO2: 24 mmol/L (ref 22–32)
Calcium: 9.3 mg/dL (ref 8.9–10.3)
Chloride: 107 mmol/L (ref 98–111)
Creatinine, Ser: 0.86 mg/dL (ref 0.44–1.00)
GFR, Estimated: >60 mL/min (ref >60)
Glucose, Bld: 95 mg/dL (ref 70–99)
Potassium: 3.7 mmol/L (ref 3.5–5.1)
Sodium: 138 mmol/L (ref 135–145)
Total Bilirubin: 0.6 mg/dL (ref 0.3–1.2)
Total Protein: 7.2 g/dL (ref 6.5–8.1)

## 2022-07-18 LAB — TROPONIN I (HIGH SENSITIVITY): Troponin I (High Sensitivity): 5 ng/L (ref <18)

## 2022-07-18 LAB — BRAIN NATRIURETIC PEPTIDE: B Natriuretic Peptide: 88.2 pg/mL (ref 0.0–100.0)

## 2022-07-18 MED ORDER — PREDNISONE 20 MG PO TABS: 40.0000 mg | ORAL_TABLET | Freq: Every day | ORAL | 0 refills | Status: AC

## 2022-07-18 MED ORDER — ALBUTEROL SULFATE HFA 108 (90 BASE) MCG/ACT IN AERS: 2.0000 | INHALATION_SPRAY | Freq: Four times a day (QID) | RESPIRATORY_TRACT | 0 refills | Status: AC | PRN

## 2022-07-18 MED ORDER — ALBUTEROL SULFATE (2.5 MG/3ML) 0.083% IN NEBU
2.5000 mg | INHALATION_SOLUTION | Freq: Once | RESPIRATORY_TRACT | Status: AC
  Administered 2022-07-18: 2.5 mg via RESPIRATORY_TRACT
  Filled 2022-07-18: qty 3

## 2022-07-18 MED ORDER — PREDNISONE 20 MG PO TABS
60.0000 mg | ORAL_TABLET | Freq: Once | ORAL | Status: AC
  Administered 2022-07-18: 60 mg via ORAL
  Filled 2022-07-18: qty 3

## 2022-07-18 MED ORDER — AZITHROMYCIN 250 MG PO TABS: ORAL_TABLET | ORAL | 0 refills | Status: AC

## 2022-07-18 NOTE — ED Notes (Signed)
ED Provider at bedside. 

## 2022-07-18 NOTE — ED Triage Notes (Signed)
Pt reports SOB x1 wk that is worse today. Reports feeling like her heart is racing as well. Pt denies chest pain. Pt denies hx of asthma or COPD. Pt also denies cardiac hx. Pt ambulatory to triage. Breathing unlabored with symmetric chest rise and fall and speaking in full sentences.

## 2022-07-18 NOTE — ED Provider Notes (Signed)
[facility] Provider Note    Event Date/Time   First MD Initiated Contact with Patient 07/18/22 2155     (approximate)   History   Shortness of Breath (/)   HPI  Victoria Bender is a 52 y.o. female   with past medical history of restless leg, anxiety, iron deficiency, here with shortness of breath and chest discomfort.  The patient states that she has had shortness of breath for the last week.  She is felt like her heart has been normally racing.  No history of asthma or COPD.  She did have a cough and cold-like symptoms that preceded this.  No leg swelling.  No leg swelling.  She states she feels like she just has occasional difficulty catching her breath and she is also had a harsh, barking type cough with occasional wheezing.  She has some mild sputum production.  No known fevers.      Physical Exam   Triage Vital Signs: ED Triage Vitals  Enc Vitals Group     BP 07/18/22 1942 (!) 143/81     Pulse Rate 07/18/22 1942 85     Resp 07/18/22 1942 18     Temp 07/18/22 1942 97.9 F (36.6 C)     Temp Source 07/18/22 1942 Oral     SpO2 07/18/22 1942 99 %     Weight 07/18/22 1943 240 lb (108.9 kg)     Height 07/18/22 1943  (1.651 m)     Head Circumference --      Peak Flow --      Pain Score --      Pain Loc --      Pain Edu? --      Excl. in GC? --     Most recent vital signs: Vitals:   07/18/22 2215 07/18/22 2230  BP: 106/75 116/77  Pulse: 78 81  Resp:  20  Temp:    SpO2: 100% 99%     General: Awake, no distress.  CV:  Good peripheral perfusion.  Resp:  Normal work of breathing.  Mild expiratory wheezes noted with forced expiration.  Otherwise normal work of breathing.  Speaking full sentences. Abd:  No distention.  No tenderness. Other:  No lower extremity edema or asymmetry.   ED Results / Procedures / Treatments   Labs (all labs ordered are listed, but only abnormal results are displayed) Labs Reviewed  CBC WITH  DIFFERENTIAL/PLATELET  COMPREHENSIVE METABOLIC PANEL  BRAIN NATRIURETIC PEPTIDE  TROPONIN I (HIGH SENSITIVITY)     EKG Sinus tachycardia with PACs.  Ventricular rate 101.  PR 138, QRS 72, QTc 3 3.  No acute ST elevation or depression or acute evidence of acute ischemia or infarct.   RADIOLOGY No focal airspace opacity.  No significant change from prior exam   I also independently reviewed and agree with radiologist interpretations.   PROCEDURES:  Critical Care performed: No   MEDICATIONS ORDERED IN ED: Medications  albuterol (PROVENTIL) (2.5 MG/3ML) 0.083% nebulizer solution 2.5 mg (2.5 mg Nebulization Given 07/18/22 2243)  predniSONE (DELTASONE) tablet 60 mg (60 mg Oral Given 07/18/22 2243)     IMPRESSION / MDM / ASSESSMENT AND PLAN / ED COURSE  I reviewed the triage vital signs and the nursing notes.                              Differential diagnosis includes, but is not limited to,  bronchitis, bronchospasm, URI, PNA, PTX, ACS, CHF, anemia  Patient's presentation is most consistent with acute presentation with potential threat to life or bodily function.  The patient is on the cardiac monitor to evaluate for evidence of arrhythmia and/or significant heart rate changes  52 yo F here with atypical chest pain. H/o prior RAD/bronchospasm and suspect same with component of possible early CAP/bronchitis. Pt is otherwise well appearing. EKG nonischemic and trop negative - do not suspect ACS. CMP unremarkable. CBC without leukocytosis or anemia. BNP normal. No clinical signs of PE or dissection. Will treat for possible mild bronchitis/RAD exacerbation and d/c with good return precautions.    FINAL CLINICAL IMPRESSION(S) / ED DIAGNOSES   Final diagnoses:  Atypical chest pain  Bronchitis     Rx / DC Orders   ED Discharge Orders          Ordered    predniSONE (DELTASONE) 20 MG tablet  Daily        07/18/22 2312    azithromycin (ZITHROMAX Z-PAK) 250 MG tablet         07/18/22 2312    albuterol (VENTOLIN HFA) 108 (90 Base) MCG/ACT inhaler  Every 6 hours PRN        07/18/22 2312             Note:  This document was prepared using Dragon voice recognition software and may include unintentional dictation errors.   Shaune Pollack, MD 07/19/22 0200

## 2022-07-23 ENCOUNTER — Encounter (HOSPITAL_COMMUNITY): Payer: Self-pay

## 2022-07-23 ENCOUNTER — Other Ambulatory Visit: Payer: Self-pay

## 2022-07-23 ENCOUNTER — Emergency Department (HOSPITAL_COMMUNITY): Payer: BC Managed Care – PPO

## 2022-07-23 ENCOUNTER — Emergency Department (HOSPITAL_COMMUNITY)
Admission: EM | Admit: 2022-07-23 | Discharge: 2022-07-23 | Disposition: A | Discharge status: Home / Self Care | Payer: BC Managed Care – PPO | Attending: Emergency Medicine | Admitting: Emergency Medicine

## 2022-07-23 DIAGNOSIS — R002 Palpitations: Secondary | ICD-10-CM | POA: Insufficient documentation

## 2022-07-23 LAB — CBC
HCT: 42.5 % (ref 36.0–46.0)
Hemoglobin: 13.8 g/dL (ref 12.0–15.0)
MCH: 29.7 pg (ref 26.0–34.0)
MCHC: 32.5 g/dL (ref 30.0–36.0)
MCV: 91.4 fL (ref 80.0–100.0)
Platelets: 216 10*3/uL (ref 150–400)
RBC: 4.65 MIL/uL (ref 3.87–5.11)
RDW: 13.3 % (ref 11.5–15.5)
WBC: 13 10*3/uL — ABNORMAL HIGH (ref 4.0–10.5)
nRBC: 0 % (ref 0.0–0.2)

## 2022-07-23 LAB — BASIC METABOLIC PANEL
Anion gap: 8 (ref 5–15)
BUN: 13 mg/dL (ref 6–20)
CO2: 25 mmol/L (ref 22–32)
Calcium: 8.7 mg/dL — ABNORMAL LOW (ref 8.9–10.3)
Chloride: 102 mmol/L (ref 98–111)
Creatinine, Ser: 0.85 mg/dL (ref 0.44–1.00)
GFR, Estimated: >60 mL/min (ref >60)
Glucose, Bld: 97 mg/dL (ref 70–99)
Potassium: 3.2 mmol/L — ABNORMAL LOW (ref 3.5–5.1)
Sodium: 135 mmol/L (ref 135–145)

## 2022-07-23 LAB — TROPONIN I (HIGH SENSITIVITY): Troponin I (High Sensitivity): 2 ng/L (ref <18)

## 2022-07-23 LAB — MAGNESIUM: Magnesium: 2.2 mg/dL (ref 1.7–2.4)

## 2022-07-23 NOTE — ED Provider Triage Note (Signed)
Emergency Medicine Provider Triage Evaluation Note  Victoria Bender , a 52 y.o. female  was evaluated in triage.  Pt complains of palpations. Started Friday. No new LE swelling. No calf pain, hx of DVT, PE. No back pain. Feels like heart is skipping a beat. Hard and painful. No DOE.  Review of Systems  Positive: palpitations Negative:   Physical Exam  BP (!) 143/76 (BP Location: Left Arm)   Pulse 92   Temp (!) 96.2 F (35.7 C) (Oral)   Resp 18   Ht  (1.651 m)   Wt 108.9 kg   LMP 07/31/2021 (Exact Date)   SpO2 100%   BMI 39.94 kg/m  Gen:   Awake, no distress   Resp:  Normal effort  MSK:   Moves extremities without difficulty  Other:    Medical Decision Making  Medically screening exam initiated at 4:05 PM.  Appropriate orders placed.  Victoria Bender was informed that the remainder of the evaluation will be completed by another provider, this initial triage assessment does not replace that evaluation, and the importance of remaining in the ED until their evaluation is complete.  palpitations   Ryane Konieczny A, PA-C 07/23/22 1606

## 2022-07-23 NOTE — ED Triage Notes (Signed)
Pt reports a constant feeling of an extra beat in her heart, "like a fluttering and pounding at the same time" ongoing since this morning when she woke up. She reports it is painful. Denies shortness of breath or dizziness.

## 2022-07-23 NOTE — ED Provider Notes (Signed)
EMERGENCY DEPARTMENT AT [facility] Provider Note   CSN: [id number] Arrival date & time: 07/23/22  1549     History  Chief Complaint  Patient presents with   Palpitations    Victoria Bender is a 52 y.o. female.  Patient presents to the emergency department complaining of intermittent palpitations.  Patient states these began after she started steroids last week due to a diagnosis of bronchitis.  She states the palpitations are intermittent but that she does feel chest tightness, rated at 7 out of 10 in severity, that coincides with the palpitation.  She denies shortness of breath, abdominal pain, nausea, vomiting.  She does endorse chronic cough related to the bronchitis.  Past medical history significant for panic attacks, iron deficiency anemia, anxiety, chest pain  HPI     Home Medications Prior to Admission medications   Medication Sig Start Date End Date Taking? Authorizing Provider  albuterol (VENTOLIN HFA) 108 (90 Base) MCG/ACT inhaler Inhale 2 puffs into the lungs every 6 (six) hours as needed for wheezing or shortness of breath. 07/18/22   Shaune Pollack, MD  azithromycin (ZITHROMAX Z-PAK) 250 MG tablet Take 2 tablets (500 mg) on  Day 1,  followed by 1 tablet (250 mg) once daily on Days 2 through 5. 07/18/22   Shaune Pollack, MD  predniSONE (DELTASONE) 20 MG tablet Take 2 tablets (40 mg total) by mouth daily for 4 days. 07/19/22 07/23/22  Shaune Pollack, MD      Allergies    Oxycodone    Review of Systems   Review of Systems  Physical Exam Updated Vital Signs BP (!) 116/92   Pulse 80   Temp 98.1 F (36.7 C) (Oral)   Resp 15   Ht  (1.651 m)   Wt 108.9 kg   LMP 07/31/2021 (Exact Date)   SpO2 100%   BMI 39.94 kg/m  Physical Exam Vitals and nursing note reviewed.  Constitutional:      General: She is not in acute distress.    Appearance: She is well-developed.  HENT:     Head: Normocephalic and atraumatic.  Eyes:      Conjunctiva/sclera: Conjunctivae normal.  Cardiovascular:     Rate and Rhythm: Normal rate and regular rhythm.     Heart sounds: No murmur heard. Pulmonary:     Effort: Pulmonary effort is normal. No respiratory distress.     Breath sounds: Rhonchi (Scattered) present.  Abdominal:     Palpations: Abdomen is soft.     Tenderness: There is no abdominal tenderness.  Musculoskeletal:        General: No swelling.     Cervical back: Neck supple.  Skin:    General: Skin is warm and dry.     Capillary Refill: Capillary refill takes less than 2 seconds.  Neurological:     Mental Status: She is alert.  Psychiatric:        Mood and Affect: Mood normal.     ED Results / Procedures / Treatments   Labs (all labs ordered are listed, but only abnormal results are displayed) Labs Reviewed  BASIC METABOLIC PANEL - Abnormal; Notable for the following components:      Result Value   Potassium 3.2 (*)    Calcium 8.7 (*)    All other components within normal limits  CBC - Abnormal; Notable for the following components:   WBC 13.0 (*)    All other components within normal limits  MAGNESIUM  TSH  TROPONIN I (HIGH SENSITIVITY)    EKG None  Radiology DG Chest 2 View  Result Date: 07/23/2022 CLINICAL DATA:  Chest pain, palpitations EXAM: CHEST - 2 VIEW COMPARISON:  07/18/2022 FINDINGS: Cardiac and mediastinal contours are within normal limits. No focal pulmonary opacity. No pleural effusion or pneumothorax. No acute osseous abnormality. Right upper quadrant surgical clips. IMPRESSION: No acute cardiopulmonary process. Electronically Signed   By: Wiliam Ke M.D.   On: 07/23/2022 16:15    Procedures Procedures    Medications Ordered in ED Medications - No data to display  ED Course/ Medical Decision Making/ A&P                             Medical Decision Making Amount and/or Complexity of Data Reviewed Labs: ordered. Radiology: ordered.   This patient presents to the ED for  concern of palpitations/chest pain, this involves an extensive number of treatment options, and is a complaint that carries with it a high risk of complications and morbidity.  The differential diagnosis includes ACS, anxiety, PE, others   Co morbidities that complicate the patient evaluation  Anxiety, anemia, bronchitis   Additional history obtained:   External records from outside source obtained and reviewed including outside emergency department notes when patient was diagnosed with bronchitis on April 19   Lab Tests:  I Ordered, and personally interpreted labs.  The pertinent results include: Grossly unremarkable BMP, CBC, magnesium, initial troponin 2   Imaging Studies ordered:  I ordered imaging studies including chest x-ray  I independently visualized and interpreted imaging which showed no acute disease I agree with the radiologist interpretation   Cardiac Monitoring: / EKG:  The patient was maintained on a cardiac monitor.  I personally viewed and interpreted the cardiac monitored which showed an underlying rhythm of: Sinus rhythm   Test / Admission - Considered:  Patient with low heart score, negative troponin, nonischemic EKG.  Very low clinical suspicion of ACS.  No shortness of breath to suggest PE.  No tachycardia.  No risk factors.  Patient does have history of anxiety and also started prednisone recently.  Question if patient is having an adverse reaction to the prednisone.  She is down to 1 pill of prednisone and plans to stop taking it which I think is reasonable..  Will have patient follow-up as an outpatient with cardiology for further evaluation and management.  Strict return precautions provided        Final Clinical Impression(s) / ED Diagnoses Final diagnoses:  Palpitations    Rx / DC Orders ED Discharge Orders          Ordered    Ambulatory referral to Cardiology       Comments: If you have not heard from the Cardiology office within the  next 72 hours please call [phone].   07/23/22 1953              Pamala Duffel 07/23/22 1955    Charlynne Pander, MD 07/23/22 [phone]

## 2022-07-23 NOTE — Discharge Instructions (Signed)
You were evaluated today for chest discomfort with palpitations.  Your workup was reassuring.  Please follow-up with cardiology for further evaluation and management.  I have placed an ambulatory referral.  They should contact you to schedule the appointment.  If you do not hear from them within for 72 hours you may call their office to help with scheduling.  If you experience life-threatening symptoms such as shortness of breath or worsening chest pain please return to the emergency department for evaluation

## 2022-08-25 NOTE — Progress Notes (Deleted)
Cardiology Office Note:   Date:  08/25/2022  NAME:  Victoria Bender    MRN: [id number] DOB:  [birth year]   PCP:  Dois Davenport, MD  Cardiologist:  None  Electrophysiologist:  None   Referring MD: Darrick Grinder, PA-C   No chief complaint on file. ***  History of Present Illness:   Victoria Bender is a 52 y.o. female with a hx of anxiety who is being seen today for the evaluation of chest pain/palpitations at the request of Pamala Duffel. Seen in ER 4/19 for acute bronchitis and started on steroids. Went back to the ER 4/24 for palpitations while on prednisone.   Past Medical History: Past Medical History:  Diagnosis Date   Allergic rhinitis    Anxiety    Chest pain 06/10/2021   See ED visit note in Epic from 06/10/21. ACS ruled out. Patient has had intermittent episodes of CP & palpitations over the years. Last Cardiology OV 06/12/21 with [facility] in Care Everywhere.   Chronic epigastric pain    recurrent discrete episodes   COVID-19 10/01/2020   HA, loss of taste, fever   Fibroid    Headache    occasional migraines   Iron deficiency anemia due to chronic blood loss 07/05/2018   Patient has received multiple iron transfusions. As of 08/13/21, her last iron infusion was 07/25/21.   Menorrhagia    Panic attack as reaction to stress    Restless leg syndrome    Wears contact lenses    Wears glasses     Past Surgical History: Past Surgical History:  Procedure Laterality Date   CHOLECYSTECTOMY  2012   CYSTOSCOPY N/A 08/20/2021   Procedure: CYSTOSCOPY;  Surgeon: Patton Salles, MD;  Location: [facility];  Service: Gynecology;  Laterality: N/A;   ENDOMETRIAL BIOPSY  07/01/2021   benign secretory endometrium   TOTAL LAPAROSCOPIC HYSTERECTOMY WITH SALPINGECTOMY Bilateral 08/20/2021   Procedure: TOTAL LAPAROSCOPIC HYSTERECTOMY WITH SALPINGECTOMY, vaginal morcellation of uterus in Alexis bag, LEFT OVARIAN BIOPSY;  Surgeon:  Patton Salles, MD;  Location: [facility];  Service: Gynecology;  Laterality: Bilateral;   TUBAL LIGATION  1998   UPPER GASTROINTESTINAL ENDOSCOPY  11/25/2010   NORMAL    Current Medications: No outpatient medications have been marked as taking for the 08/27/22 encounter (Appointment) with O'Neal, Ronnald Ramp, MD.     Allergies:    Oxycodone   Social History: Social History   Socioeconomic History   Marital status: Single    Spouse name: Not on file   Number of children: 2   Years of education: Not on file   Highest education level: Not on file  Occupational History   Not on file  Tobacco Use   Smoking status: Never   Smokeless tobacco: Never  Vaping Use   Vaping Use: Never used  Substance and Sexual Activity   Alcohol use: No    Comment: only a few times per year   Drug use: No   Sexual activity: Yes    Birth control/protection: Surgical    Comment: Tubal/first intercourse 18  Other Topics Concern   Not on file  Social History Narrative   Separated - living with her mom   2 adult kids   Work - self-employed - Lexicographer, rare EtOH, no drugs   Social Determinants of Corporate investment banker Strain: Not on file  Food Insecurity: Not on file  Transportation Needs: Not on file  Physical Activity: Not on file  Stress: Not on file  Social Connections: Not on file     Family History: The patient's ***family history includes Colon polyps in her brother and father; Diabetes in her maternal grandmother; Heart attack in her brother; Hypertension in her father and mother; Pancreatic cancer in her maternal aunt. There is no history of Colon cancer, Esophageal cancer, Stomach cancer, or Rectal cancer.  ROS:   All other ROS reviewed and negative. Pertinent positives noted in the HPI.     EKGs/Labs/Other Studies Reviewed:   The following studies were personally reviewed by me today:  EKG:  EKG is *** ordered today.  The ekg  ordered today demonstrates ***, and was personally reviewed by me.   Recent Labs: 07/18/2022: ALT 15; B Natriuretic Peptide 88.2 07/23/2022: BUN 13; Creatinine, Ser 0.85; Hemoglobin 13.8; Magnesium 2.2; Platelets 216; Potassium 3.2; Sodium 135   Recent Lipid Panel No results found for: "CHOL", "TRIG", "HDL", "CHOLHDL", "VLDL", "LDLCALC", "LDLDIRECT"  Physical Exam:   VS:  LMP 07/31/2021 (Exact Date)    Wt Readings from Last 3 Encounters:  07/23/22 240 lb (108.9 kg)  07/18/22 240 lb (108.9 kg)  06/17/22 246 lb 6.4 oz (111.8 kg)    General: Well nourished, well developed, in no acute distress Head: Atraumatic, normal size  Eyes: PEERLA, EOMI  Neck: Supple, no JVD Endocrine: No thryomegaly Cardiac: Normal S1, S2; RRR; no murmurs, rubs, or gallops Lungs: Clear to auscultation bilaterally, no wheezing, rhonchi or rales  Abd: Soft, nontender, no hepatomegaly  Ext: No edema, pulses 2+ Musculoskeletal: No deformities, BUE and BLE strength normal and equal Skin: Warm and dry, no rashes   Neuro: Alert and oriented to person, place, time, and situation, CNII-XII grossly intact, no focal deficits  Psych: Normal mood and affect   ASSESSMENT:   Victoria Bender is a 52 y.o. female who presents for the following: No diagnosis found.  PLAN:   There are no diagnoses linked to this encounter.  {Are you ordering a CV Procedure (e.g. stress test, cath, DCCV, TEE, etc)?   Press F2        :[id number]}  Disposition: No follow-ups on file.  Medication Adjustments/Labs and Tests Ordered: Current medicines are reviewed at length with the patient today.  Concerns regarding medicines are outlined above.  No orders of the defined types were placed in this encounter.  No orders of the defined types were placed in this encounter.   There are no Patient Instructions on file for this visit.   Time Spent with Patient: I have spent a total of *** minutes with patient reviewing hospital notes,  telemetry, EKGs, labs and examining the patient as well as establishing an assessment and plan that was discussed with the patient.  > 50% of time was spent in direct patient care.  Signed, Lenna Gilford. Flora Lipps, MD, [facility]  [facility]  [address], [address] [place], [place] [address] [phone]  08/25/2022 8:50 PM

## 2022-08-27 ENCOUNTER — Ambulatory Visit: Payer: BC Managed Care – PPO | Attending: Cardiovascular Disease | Admitting: Cardiovascular Disease

## 2022-08-27 DIAGNOSIS — R002 Palpitations: Secondary | ICD-10-CM

## 2022-08-27 DIAGNOSIS — R072 Precordial pain: Secondary | ICD-10-CM

## 2022-09-05 ENCOUNTER — Encounter: Payer: Self-pay | Admitting: General Practice

## 2022-12-16 ENCOUNTER — Other Ambulatory Visit: Payer: Self-pay | Admitting: Family Medicine

## 2022-12-16 DIAGNOSIS — Z1231 Encounter for screening mammogram for malignant neoplasm of breast: Secondary | ICD-10-CM

## 2022-12-27 ENCOUNTER — Emergency Department
Admission: EM | Admit: 2022-12-27 | Discharge: 2022-12-27 | Disposition: A | Discharge status: Home / Self Care | Payer: BC Managed Care – PPO | Attending: Emergency Medicine | Admitting: Emergency Medicine

## 2022-12-27 ENCOUNTER — Other Ambulatory Visit: Payer: Self-pay

## 2022-12-27 DIAGNOSIS — R519 Headache, unspecified: Secondary | ICD-10-CM | POA: Diagnosis present

## 2022-12-27 DIAGNOSIS — G8929 Other chronic pain: Secondary | ICD-10-CM | POA: Diagnosis not present

## 2022-12-27 LAB — BASIC METABOLIC PANEL
Anion gap: 6 (ref 5–15)
BUN: 11 mg/dL (ref 6–20)
CO2: 24 mmol/L (ref 22–32)
Calcium: 9 mg/dL (ref 8.9–10.3)
Chloride: 107 mmol/L (ref 98–111)
Creatinine, Ser: 0.82 mg/dL (ref 0.44–1.00)
GFR, Estimated: >60 mL/min (ref >60)
Glucose, Bld: 100 mg/dL — ABNORMAL HIGH (ref 70–99)
Potassium: 4.1 mmol/L (ref 3.5–5.1)
Sodium: 137 mmol/L (ref 135–145)

## 2022-12-27 LAB — CBC
HCT: 44.1 % (ref 36.0–46.0)
Hemoglobin: 14.1 g/dL (ref 12.0–15.0)
MCH: 28.9 pg (ref 26.0–34.0)
MCHC: 32 g/dL (ref 30.0–36.0)
MCV: 90.4 fL (ref 80.0–100.0)
Platelets: 226 10*3/uL (ref 150–400)
RBC: 4.88 MIL/uL (ref 3.87–5.11)
RDW: 12.9 % (ref 11.5–15.5)
WBC: 7.3 10*3/uL (ref 4.0–10.5)
nRBC: 0 % (ref 0.0–0.2)

## 2022-12-27 LAB — CBG MONITORING, ED: Glucose-Capillary: 80 mg/dL (ref 70–99)

## 2022-12-27 MED ORDER — DIPHENHYDRAMINE HCL 25 MG PO CAPS
25.0000 mg | ORAL_CAPSULE | Freq: Once | ORAL | Status: AC
  Administered 2022-12-27: 25 mg via ORAL
  Filled 2022-12-27: qty 1

## 2022-12-27 MED ORDER — IBUPROFEN 600 MG PO TABS
600.0000 mg | ORAL_TABLET | Freq: Once | ORAL | Status: AC
  Administered 2022-12-27: 600 mg via ORAL
  Filled 2022-12-27: qty 1

## 2022-12-27 MED ORDER — PROCHLORPERAZINE MALEATE 5 MG PO TABS
5.0000 mg | ORAL_TABLET | Freq: Once | ORAL | Status: AC
  Administered 2022-12-27: 5 mg via ORAL
  Filled 2022-12-27: qty 1

## 2022-12-27 NOTE — ED Provider Notes (Signed)
[facility] Provider Note    Event Date/Time   First MD Initiated Contact with Patient 12/27/22 1248     (approximate)   History   Headache and Dizziness   HPI Victoria Bender is a 52 y.o. female presenting today for headache and nausea.  Patient states over the past 5 weeks she has intermittent headaches that occur throughout the day.  She sometimes has associated vision changes around headache onset as well as nausea.  She sometimes notices tingling but denies any numbness or weakness or loss of function anywhere.  She does not take any medication and the headaches resolved after short period of time.  She denies any other symptoms with the headaches.  No other neurological symptoms.  Does admit to poor hydration daily.  Has a mild headache at this time but otherwise asymptomatic.     Physical Exam   Triage Vital Signs: ED Triage Vitals  Encounter Vitals Group     BP 12/27/22 1219 121/83     Systolic BP Percentile --      Diastolic BP Percentile --      Pulse Rate 12/27/22 1219 70     Resp 12/27/22 1219 17     Temp 12/27/22 1219 97.8 F (36.6 C)     Temp Source 12/27/22 1219 Oral     SpO2 12/27/22 1219 97 %     Weight 12/27/22 1220 247 lb (112 kg)     Height 12/27/22 1220 5\' 5"  (1.651 m)     Head Circumference --      Peak Flow --      Pain Score 12/27/22 1220 8     Pain Loc --      Pain Education --      Exclude from Growth Chart --     Most recent vital signs: Vitals:   12/27/22 1219  BP: 121/83  Pulse: 70  Resp: 17  Temp: 97.8 F (36.6 C)  SpO2: 97%   Physical Exam: I have reviewed the vital signs and nursing notes. General: Awake, alert, no acute distress.  Nontoxic appearing. Head:  Atraumatic, normocephalic.   ENT:  EOM intact, PERRL. Oral mucosa is pink and moist with no lesions. Neck: Neck is supple with full range of motion, No meningeal signs. Cardiovascular:  RRR, No murmurs. Peripheral pulses palpable and equal  bilaterally. Respiratory:  Symmetrical chest wall expansion.  No rhonchi, rales, or wheezes.  Good air movement throughout.  No use of accessory muscles.   Musculoskeletal:  No cyanosis or edema. Moving extremities with full ROM Abdomen:  Soft, nontender, nondistended. Neuro:  GCS 15, moving all four extremities, interacting appropriately. Speech clear.  5 out of 5 strength in bilateral upper and lower extremities.  Sensation intact and equal throughout bilateral upper and lower extremities.  Cranial nerves II through XII intact. Psych:  Calm, appropriate.   Skin:  Warm, dry, no rash.    ED Results / Procedures / Treatments   Labs (all labs ordered are listed, but only abnormal results are displayed) Labs Reviewed  BASIC METABOLIC PANEL - Abnormal; Notable for the following components:      Result Value   Glucose, Bld 100 (*)    All other components within normal limits  CBC  CBG MONITORING, ED     EKG    RADIOLOGY    PROCEDURES:  Critical Care performed: No  Procedures   MEDICATIONS ORDERED IN ED: Medications  prochlorperazine (COMPAZINE) tablet 5 mg (has no  administration in time range)  diphenhydrAMINE (BENADRYL) capsule 25 mg (has no administration in time range)  ibuprofen (ADVIL) tablet 600 mg (has no administration in time range)     IMPRESSION / MDM / ASSESSMENT AND PLAN / ED COURSE  I reviewed the triage vital signs and the nursing notes.                              Differential diagnosis includes, but is not limited to, migraine, tension headaches, dehydration.  Patient's presentation is most consistent with acute complicated illness / injury requiring diagnostic workup.  Patient is a 52 year old female presenting today for mild intermittent headaches over the past 5 weeks often times associated with nausea and initial vision changes but all resolved once the headache is gone.  She does not take any medication for the headaches and they resolve  naturally on their own with limited duration.  She has no neurological findings on exam today and vital signs are stable.  Blood work is unremarkable.  Does admit to poor hydration and feel headache is more likely related to likely tension headaches versus migraines with aura.  Patient was given ibuprofen, Compazine, and Benadryl here in the emergency department.  We did discuss more serious causes of headaches resolving intracranial abnormalities or IIH.  Patient having no symptoms at this time I do not feel she warrants further CT imaging.  She is also not tried to take any medications when she has the headache so we have not fully treated for more benign causes of headache at this time.  Also totally normal neurological examination.  Patient is agreement with plan to treat outpatient for tension headaches and migraines with p.o. hydration and ibuprofen as needed.  She will have follow-up with primary care provider to monitor symptoms.  She was given strict return precautions for worsening symptoms.    FINAL CLINICAL IMPRESSION(S) / ED DIAGNOSES   Final diagnoses:  Chronic nonintractable headache, unspecified headache type     Rx / DC Orders   ED Discharge Orders          Ordered    Ambulatory Referral to Primary Care (Establish Care)        12/27/22 1307             Note:  This document was prepared using Dragon voice recognition software and may include unintentional dictation errors.   Janith Lima, MD 12/27/22 [phone]

## 2022-12-27 NOTE — ED Triage Notes (Addendum)
Pt complains of headache and dizziness since about 10am. States she has had some nausea and left arm tingling as well. Per pt, she has woken up every morning for the last 5 weeks with a headache. Today she did not wake up with the headache, it came on later in the morning.

## 2022-12-27 NOTE — Discharge Instructions (Addendum)
Please take ibuprofen 600 mg or 2 tablets of Excedrin when you have a headache to see if symptoms improve.  Recommend improving oral hydration to see if this helps with symptoms.  Please follow-up with your primary care provider within the next couple weeks for reassessment and ongoing management.  Please return to the emergency department for any worsening symptoms.

## 2023-01-02 ENCOUNTER — Ambulatory Visit: Payer: BC Managed Care – PPO

## 2023-01-19 ENCOUNTER — Ambulatory Visit: Payer: BC Managed Care – PPO

## 2023-01-20 ENCOUNTER — Ambulatory Visit
Admission: RE | Admit: 2023-01-20 | Discharge: 2023-01-20 | Disposition: A | Discharge status: Home / Self Care | Payer: BC Managed Care – PPO | Source: Ambulatory Visit | Attending: Family Medicine | Admitting: Family Medicine

## 2023-01-20 DIAGNOSIS — Z1231 Encounter for screening mammogram for malignant neoplasm of breast: Secondary | ICD-10-CM

## 2023-03-14 ENCOUNTER — Emergency Department
Admission: EM | Admit: 2023-03-14 | Discharge: 2023-03-14 | Discharge status: Against Medical Advice | Payer: BC Managed Care – PPO | Attending: Emergency Medicine | Admitting: Emergency Medicine

## 2023-03-14 ENCOUNTER — Other Ambulatory Visit: Payer: Self-pay

## 2023-03-14 ENCOUNTER — Emergency Department: Payer: BC Managed Care – PPO

## 2023-03-14 DIAGNOSIS — Z5321 Procedure and treatment not carried out due to patient leaving prior to being seen by health care provider: Secondary | ICD-10-CM | POA: Diagnosis not present

## 2023-03-14 DIAGNOSIS — R0602 Shortness of breath: Secondary | ICD-10-CM | POA: Insufficient documentation

## 2023-03-14 DIAGNOSIS — I4892 Unspecified atrial flutter: Secondary | ICD-10-CM | POA: Insufficient documentation

## 2023-03-14 LAB — CBC
HCT: 42.7 % (ref 36.0–46.0)
Hemoglobin: 14.1 g/dL (ref 12.0–15.0)
MCH: 29.9 pg (ref 26.0–34.0)
MCHC: 33 g/dL (ref 30.0–36.0)
MCV: 90.5 fL (ref 80.0–100.0)
Platelets: 218 10*3/uL (ref 150–400)
RBC: 4.72 MIL/uL (ref 3.87–5.11)
RDW: 12.8 % (ref 11.5–15.5)
WBC: 7.4 10*3/uL (ref 4.0–10.5)
nRBC: 0 % (ref 0.0–0.2)

## 2023-03-14 LAB — BASIC METABOLIC PANEL
Anion gap: 9 (ref 5–15)
BUN: 9 mg/dL (ref 6–20)
CO2: 25 mmol/L (ref 22–32)
Calcium: 8.7 mg/dL — ABNORMAL LOW (ref 8.9–10.3)
Chloride: 104 mmol/L (ref 98–111)
Creatinine, Ser: 0.89 mg/dL (ref 0.44–1.00)
GFR, Estimated: >60 mL/min (ref >60)
Glucose, Bld: 94 mg/dL (ref 70–99)
Potassium: 3.6 mmol/L (ref 3.5–5.1)
Sodium: 138 mmol/L (ref 135–145)

## 2023-03-14 LAB — TROPONIN I (HIGH SENSITIVITY): Troponin I (High Sensitivity): 3 ng/L (ref <18)

## 2023-03-14 NOTE — ED Triage Notes (Addendum)
Pt to ed from home via POV for "[facility] and heart fluttering" x 1 week. Pt is caox4, in no acute distress and ambulatory in triage. Pt has had this for awhile and has been seen multiple times for same w/o "answers or diagnosis".

## 2023-09-15 IMAGING — CR DG CHEST 2V
2 series · 2 of 2 positions shown · non-contrast
Comparison: Chest x-ray 10/01/2020.

CLINICAL DATA: Chest pain.

EXAM:
CHEST - 2 VIEW

[chest pa]
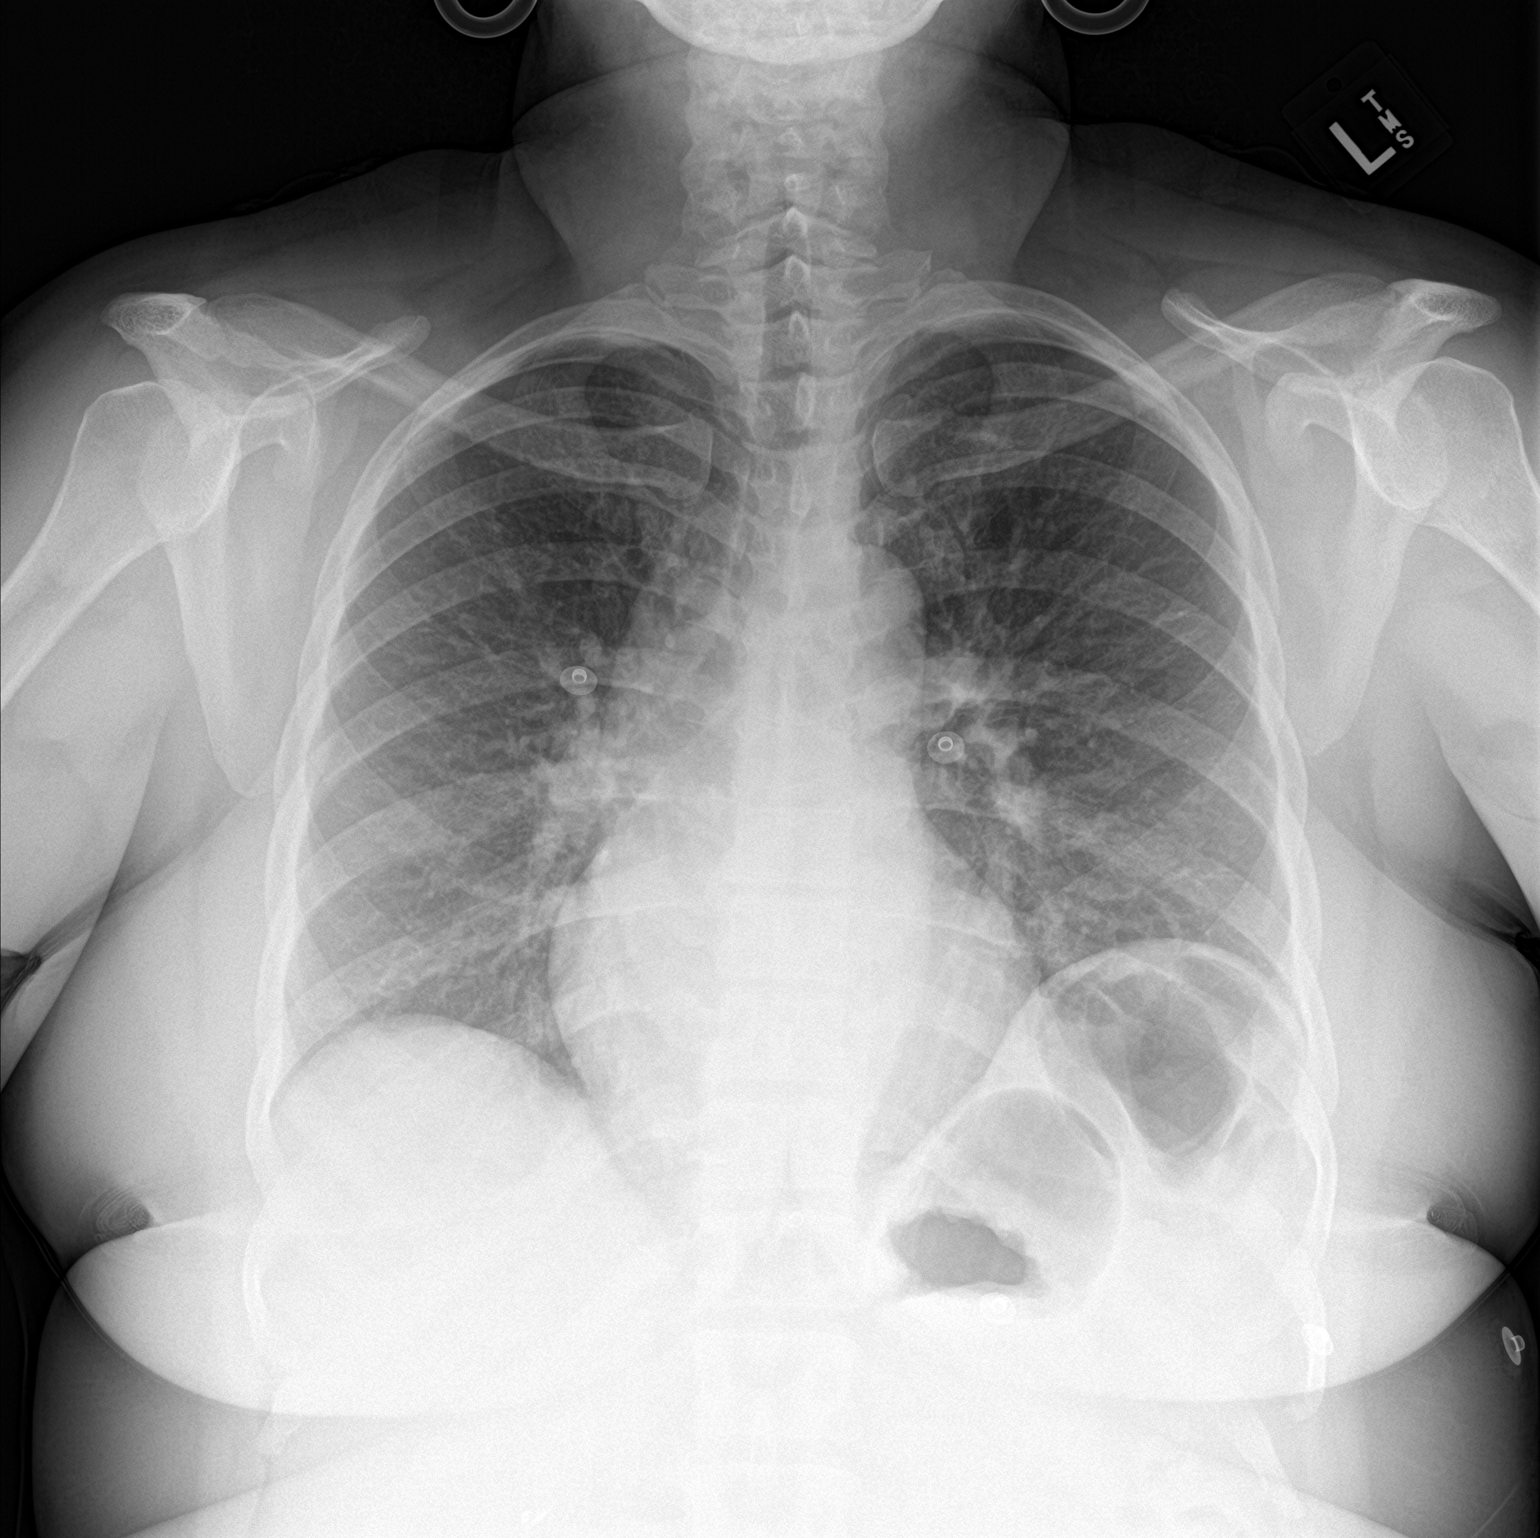

[chest lat]
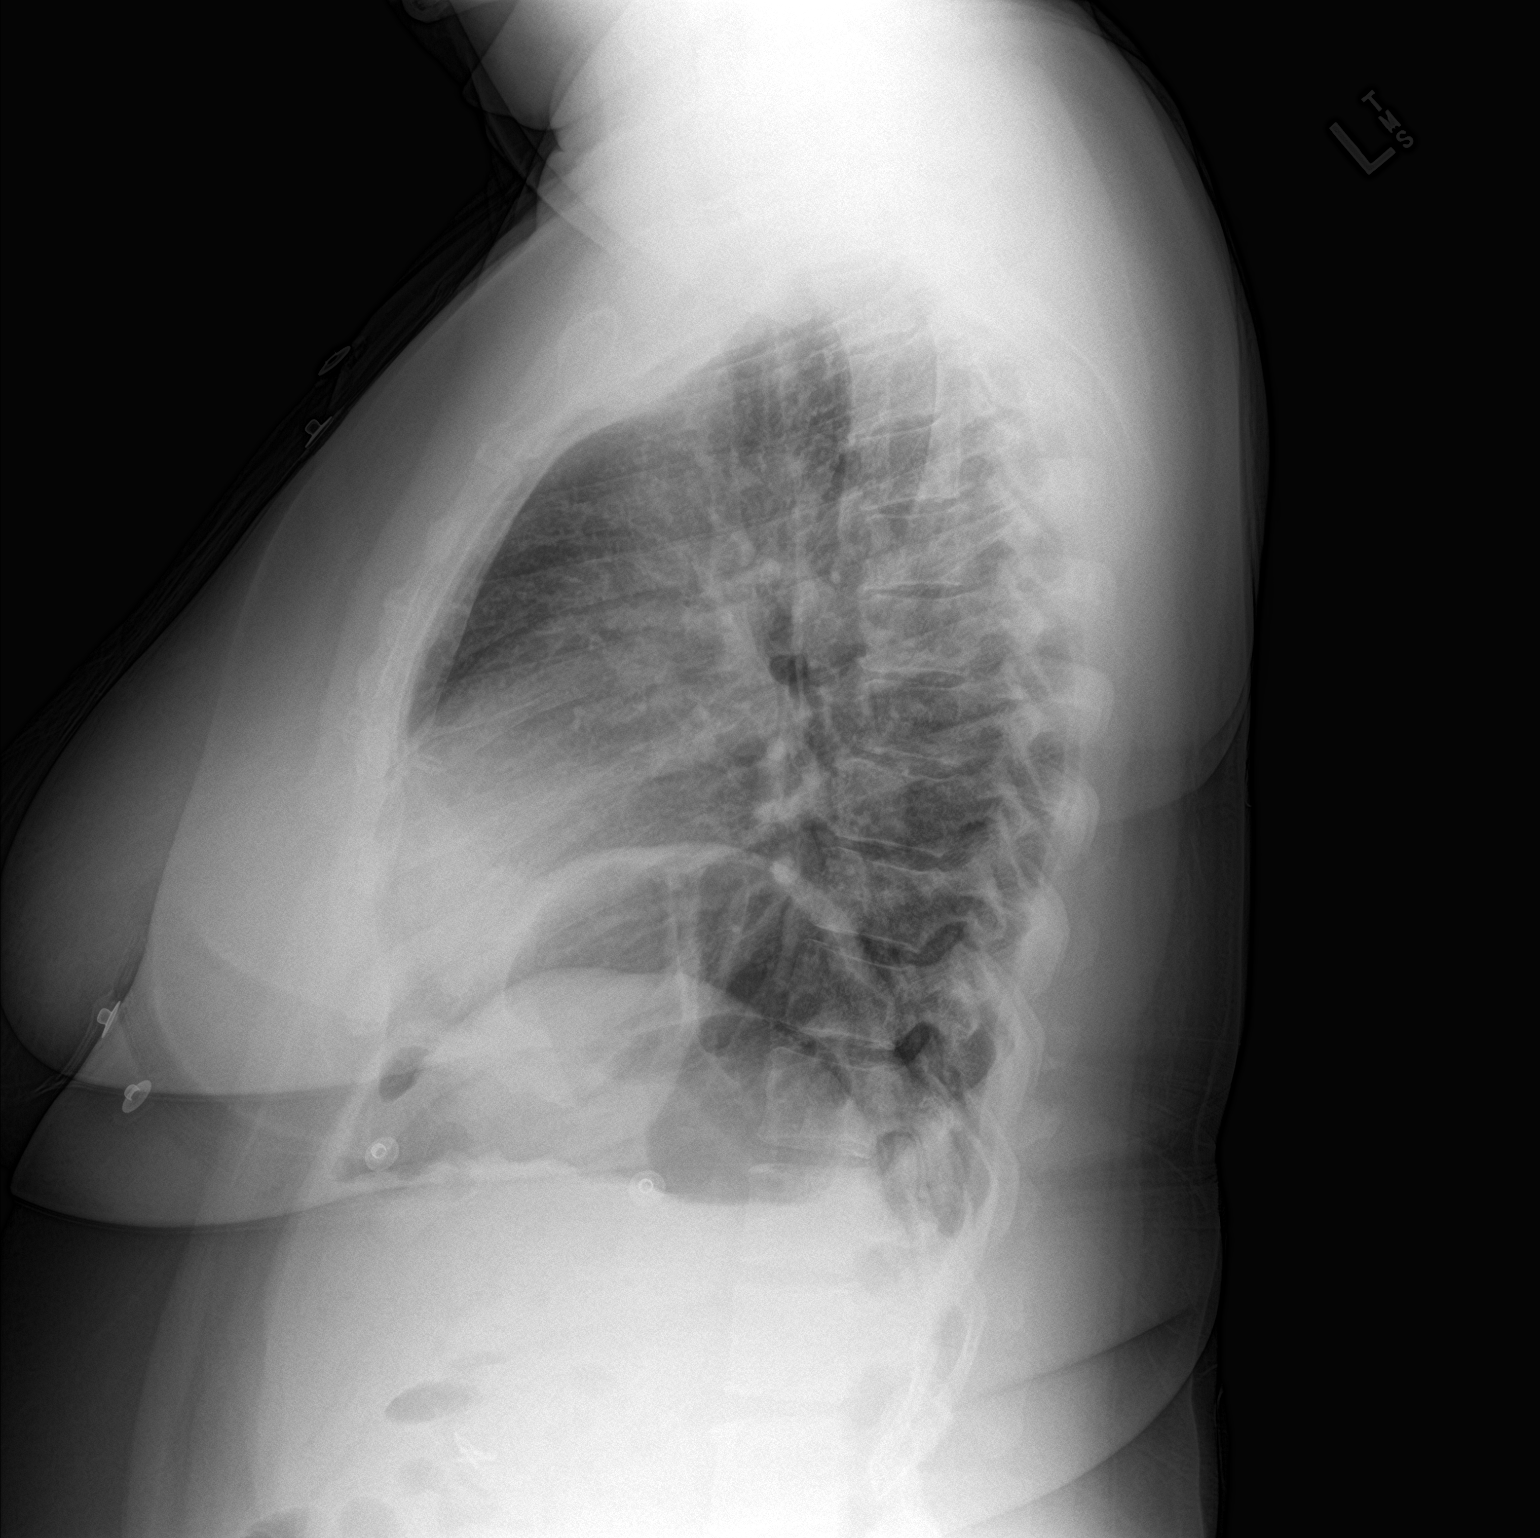

[2 of 2 positions shown; findings below may reference images not displayed]

FINDINGS: There is stable mild elevation of the left hemidiaphragm. The heart
size and mediastinal contours are within normal limits. Both lungs
are clear. The visualized skeletal structures are unremarkable.
IMPRESSION: No active cardiopulmonary disease.

## 2023-09-19 IMAGING — CR DG CHEST 2V
2 series · 2 of 2 positions shown · non-contrast
Comparison: 06/06/2021

CLINICAL DATA: Chest pain, left-sided rib pain

EXAM:
CHEST - 2 VIEW

[chest pa]
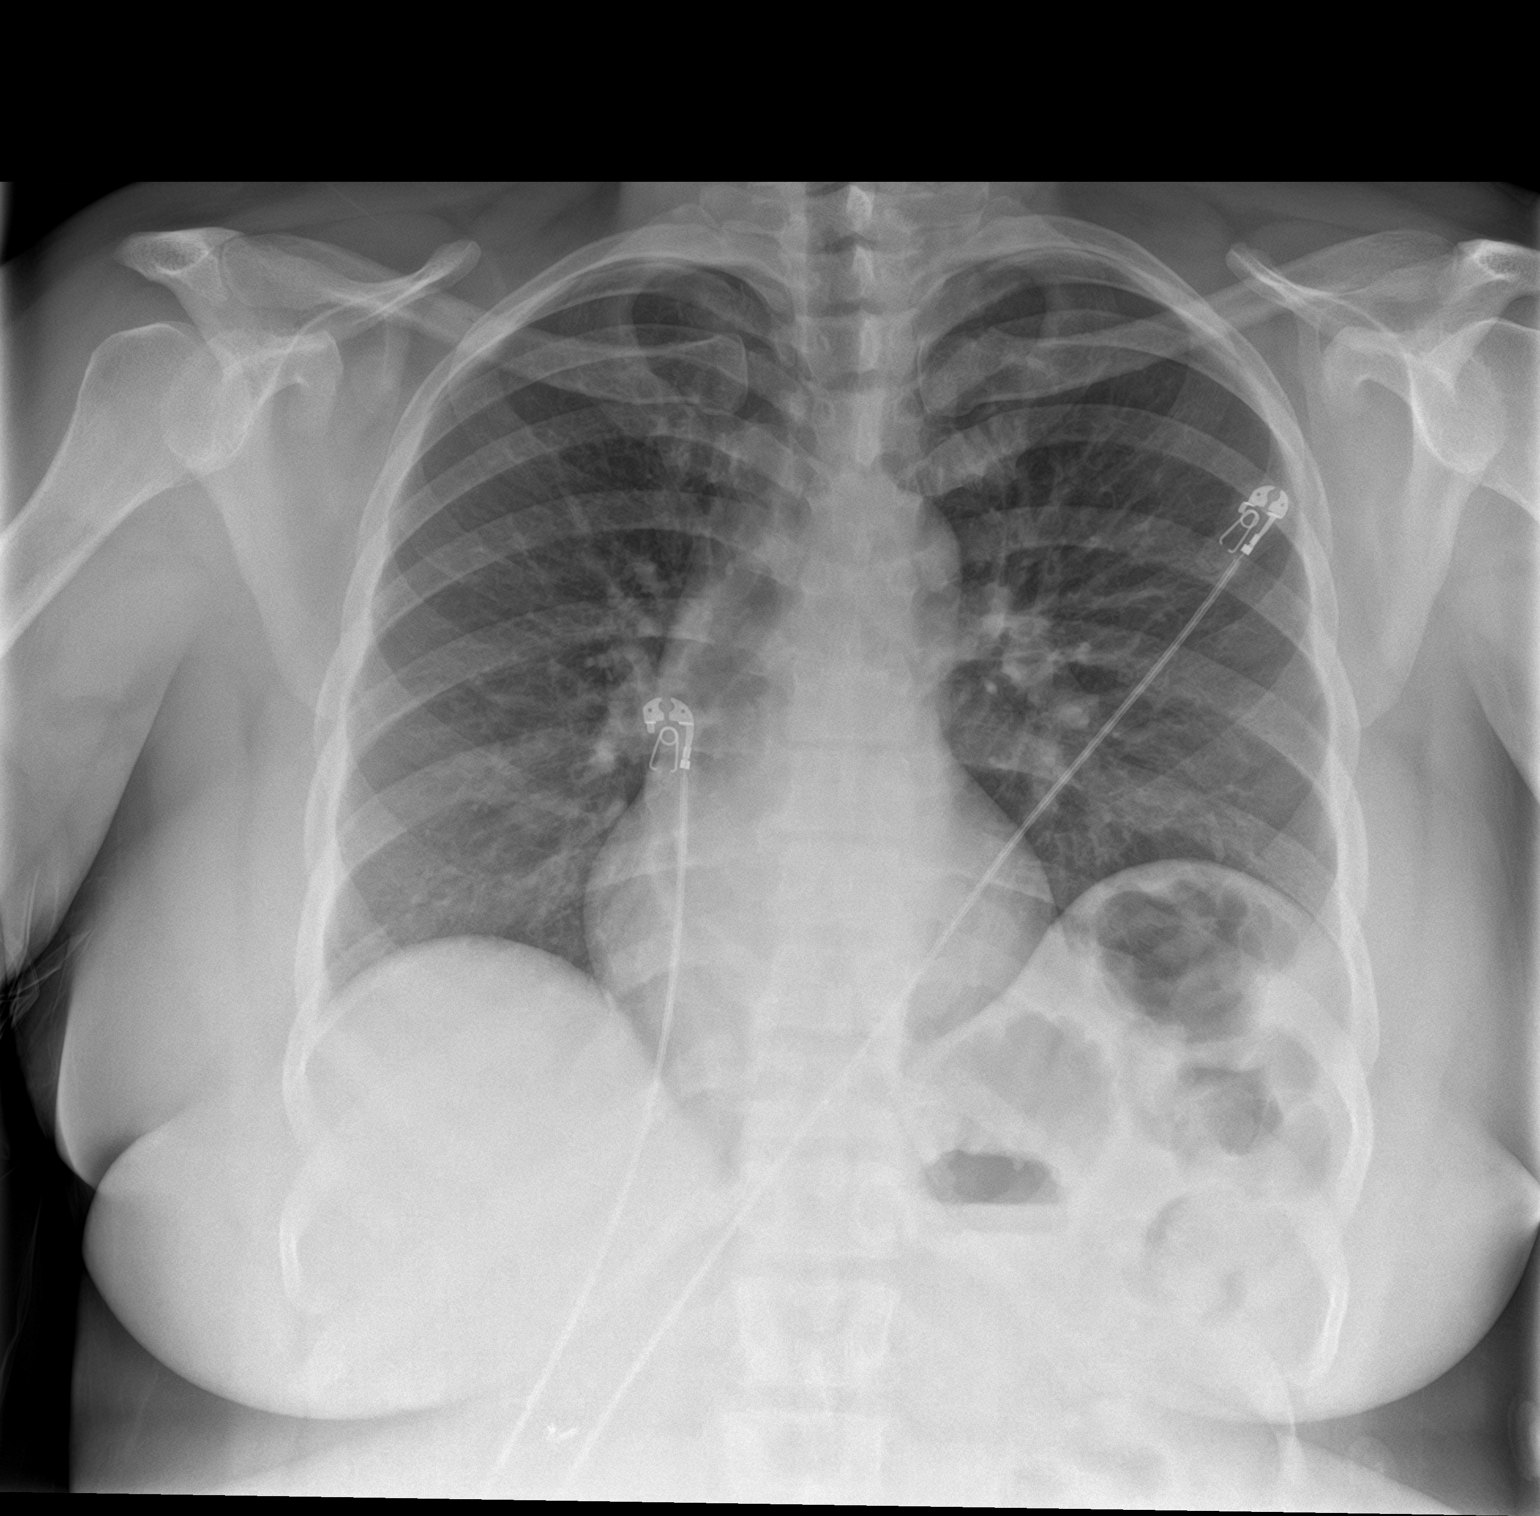

[chest lat]
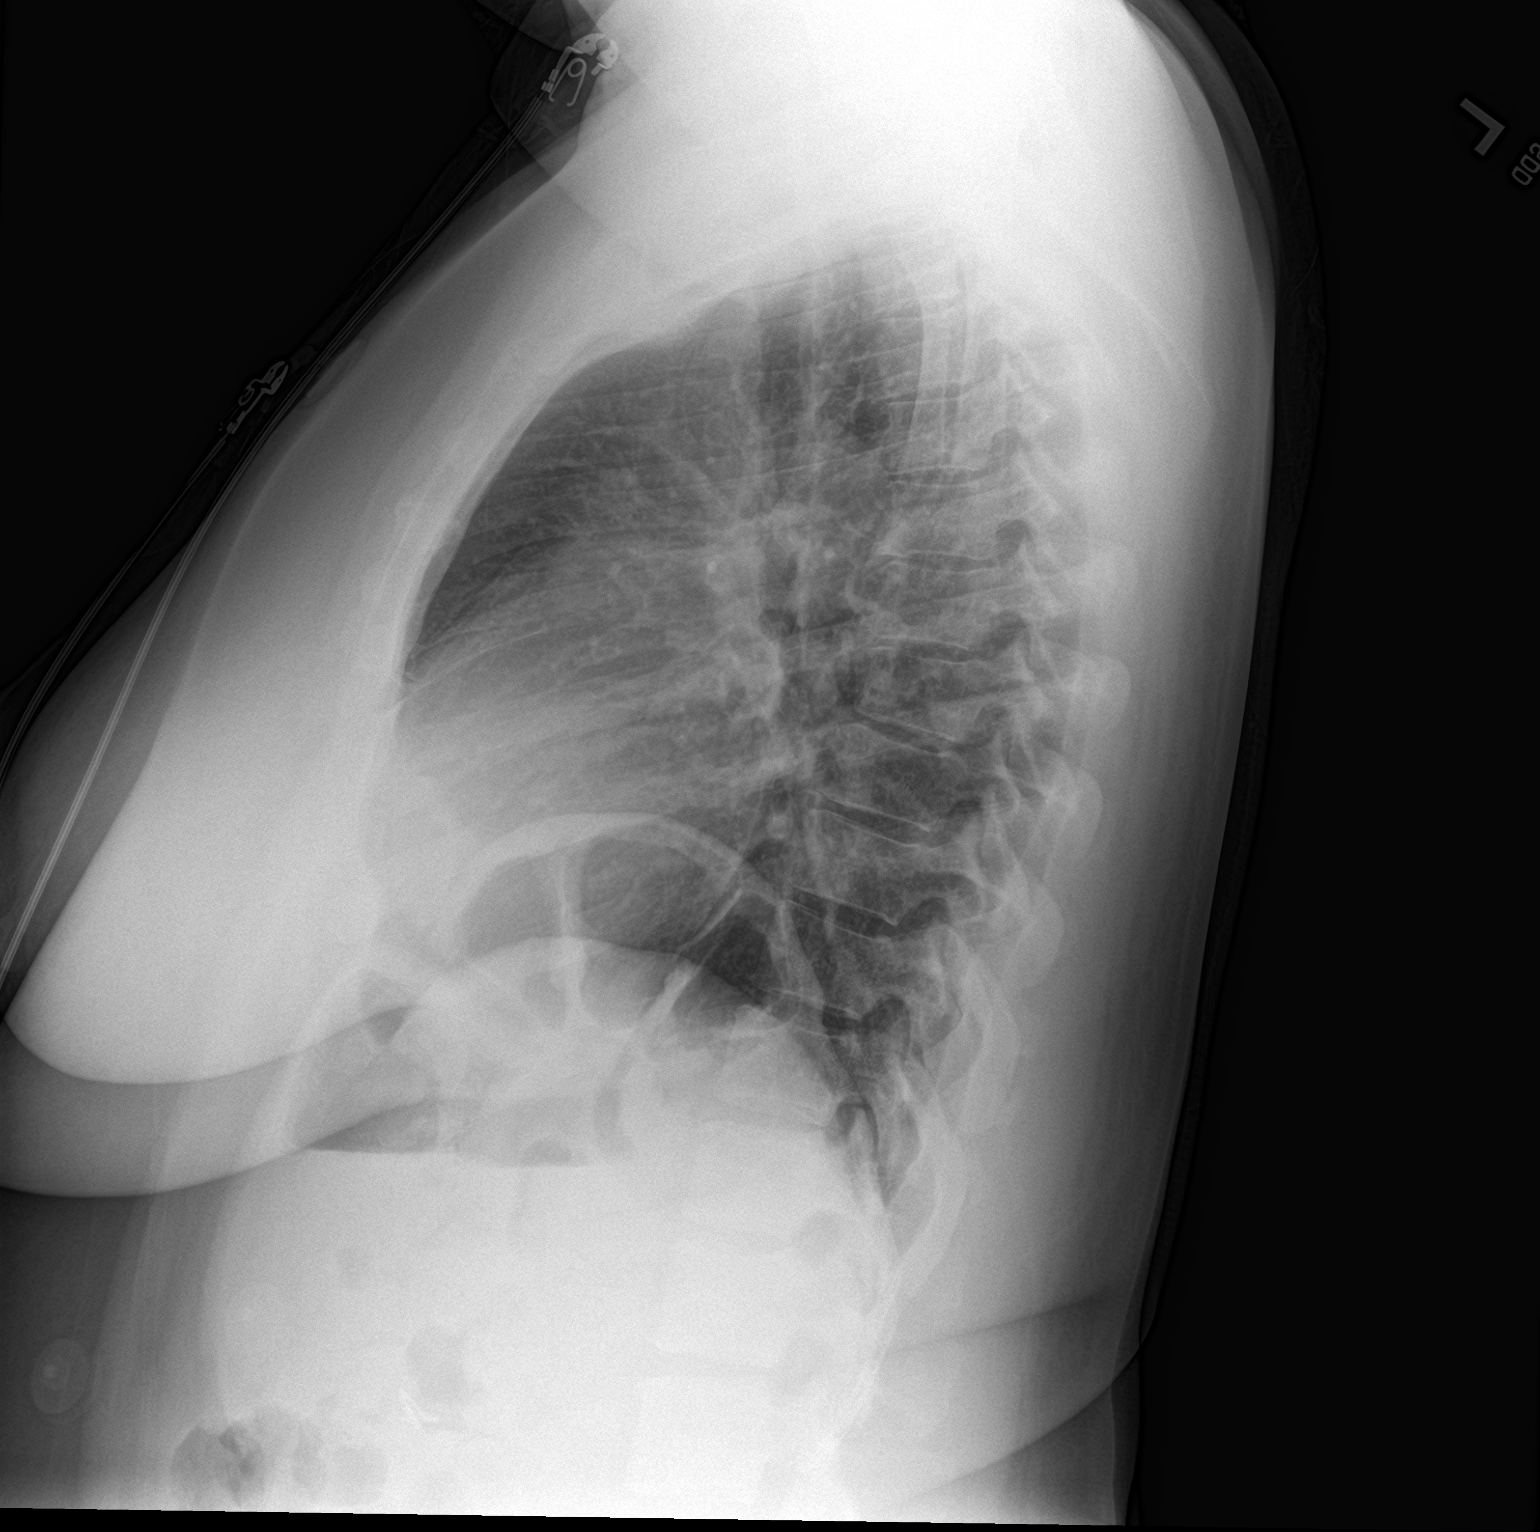

[2 of 2 positions shown; findings below may reference images not displayed]

FINDINGS: The heart size and mediastinal contours are within normal limits.
Both lungs are clear. Unchanged elevation of the left hemidiaphragm.
The visualized skeletal structures are unremarkable.
IMPRESSION: No acute abnormality of the lungs. Unchanged elevation of the left
hemidiaphragm.

## 2023-10-31 ENCOUNTER — Encounter: Payer: Self-pay | Admitting: Internal Medicine

## 2024-03-09 ENCOUNTER — Telehealth: Payer: Self-pay

## 2024-03-09 NOTE — Telephone Encounter (Signed)
 Telephone call to schedule PV and Colonoscopy with Dr. Avram per recall assessment sheet.  VM obtained and message left to call back and ask for scheduling.
